# Patient Record
Sex: Male | Born: 1940 | Race: White | Hispanic: No | Marital: Married | Smoking: Former smoker
Health system: Southern US, Community
[De-identification: ages and names within clinical notes are randomized; demographics above are authoritative.]

## PROBLEM LIST (undated history)

## (undated) DIAGNOSIS — F0390 Unspecified dementia without behavioral disturbance: Secondary | ICD-10-CM

## (undated) DIAGNOSIS — G4733 Obstructive sleep apnea (adult) (pediatric): Secondary | ICD-10-CM

## (undated) DIAGNOSIS — I4819 Other persistent atrial fibrillation: Secondary | ICD-10-CM

## (undated) DIAGNOSIS — G5139 Clonic hemifacial spasm, unspecified: Secondary | ICD-10-CM

## (undated) DIAGNOSIS — G20 Parkinson's disease: Secondary | ICD-10-CM

## (undated) DIAGNOSIS — E785 Hyperlipidemia, unspecified: Secondary | ICD-10-CM

## (undated) DIAGNOSIS — I10 Essential (primary) hypertension: Secondary | ICD-10-CM

## (undated) DIAGNOSIS — K5790 Diverticulosis of intestine, part unspecified, without perforation or abscess without bleeding: Secondary | ICD-10-CM

## (undated) DIAGNOSIS — I2510 Atherosclerotic heart disease of native coronary artery without angina pectoris: Secondary | ICD-10-CM

## (undated) DIAGNOSIS — I714 Abdominal aortic aneurysm, without rupture, unspecified: Secondary | ICD-10-CM

## (undated) DIAGNOSIS — E039 Hypothyroidism, unspecified: Secondary | ICD-10-CM

## (undated) DIAGNOSIS — G20C Parkinsonism, unspecified: Secondary | ICD-10-CM

## (undated) DIAGNOSIS — Z7901 Long term (current) use of anticoagulants: Secondary | ICD-10-CM

## (undated) DIAGNOSIS — N182 Chronic kidney disease, stage 2 (mild): Secondary | ICD-10-CM

## (undated) DIAGNOSIS — M109 Gout, unspecified: Secondary | ICD-10-CM

## (undated) DIAGNOSIS — K5641 Fecal impaction: Secondary | ICD-10-CM

## (undated) DIAGNOSIS — K760 Fatty (change of) liver, not elsewhere classified: Secondary | ICD-10-CM

## (undated) DIAGNOSIS — R600 Localized edema: Secondary | ICD-10-CM

## (undated) HISTORY — DX: Localized edema: R60.0

## (undated) HISTORY — DX: Fatty (change of) liver, not elsewhere classified: K76.0

## (undated) HISTORY — DX: Hyperlipidemia, unspecified: E78.5

## (undated) HISTORY — DX: Obstructive sleep apnea (adult) (pediatric): G47.33

## (undated) HISTORY — DX: Abdominal aortic aneurysm, without rupture, unspecified: I71.40

## (undated) HISTORY — DX: Long term (current) use of anticoagulants: Z79.01

## (undated) HISTORY — DX: Other persistent atrial fibrillation: I48.19

## (undated) HISTORY — DX: Gout, unspecified: M10.9

## (undated) HISTORY — DX: Diverticulosis of intestine, part unspecified, without perforation or abscess without bleeding: K57.90

## (undated) HISTORY — DX: Chronic kidney disease, stage 2 (mild): N18.2

## (undated) HISTORY — DX: Hypothyroidism, unspecified: E03.9

## (undated) HISTORY — DX: Abdominal aortic aneurysm, without rupture: I71.4

---

## 1996-11-26 HISTORY — PX: SPINE SURGERY: SHX786

## 1999-03-26 ENCOUNTER — Emergency Department (HOSPITAL_COMMUNITY): Admission: EM | Admit: 1999-03-26 | Discharge: 1999-03-26 | Payer: Self-pay | Admitting: Emergency Medicine

## 1999-03-26 ENCOUNTER — Encounter: Payer: Self-pay | Admitting: Emergency Medicine

## 1999-08-26 ENCOUNTER — Ambulatory Visit: Admission: RE | Admit: 1999-08-26 | Discharge: 1999-08-26 | Payer: Self-pay | Admitting: *Deleted

## 2001-06-09 ENCOUNTER — Encounter: Payer: Self-pay | Admitting: Internal Medicine

## 2001-06-09 ENCOUNTER — Encounter: Admission: RE | Admit: 2001-06-09 | Discharge: 2001-06-09 | Payer: Self-pay | Admitting: Internal Medicine

## 2001-12-27 ENCOUNTER — Emergency Department (HOSPITAL_COMMUNITY): Admission: EM | Admit: 2001-12-27 | Discharge: 2001-12-27 | Payer: Self-pay | Admitting: Emergency Medicine

## 2005-04-06 ENCOUNTER — Encounter: Admission: RE | Admit: 2005-04-06 | Discharge: 2005-04-06 | Payer: Self-pay | Admitting: Internal Medicine

## 2008-05-24 ENCOUNTER — Encounter (INDEPENDENT_AMBULATORY_CARE_PROVIDER_SITE_OTHER): Payer: Self-pay | Admitting: Family Medicine

## 2008-05-24 ENCOUNTER — Ambulatory Visit: Payer: Self-pay | Admitting: *Deleted

## 2008-05-24 ENCOUNTER — Ambulatory Visit (HOSPITAL_COMMUNITY): Admission: RE | Admit: 2008-05-24 | Discharge: 2008-05-24 | Payer: Self-pay | Admitting: Family Medicine

## 2009-01-05 ENCOUNTER — Encounter (INDEPENDENT_AMBULATORY_CARE_PROVIDER_SITE_OTHER): Payer: Self-pay | Admitting: Cardiology

## 2009-01-05 ENCOUNTER — Ambulatory Visit (HOSPITAL_COMMUNITY): Admission: RE | Admit: 2009-01-05 | Discharge: 2009-01-05 | Payer: Self-pay | Admitting: Cardiology

## 2009-06-13 ENCOUNTER — Encounter: Admission: RE | Admit: 2009-06-13 | Discharge: 2009-06-13 | Payer: Self-pay | Admitting: Cardiology

## 2009-06-16 ENCOUNTER — Encounter: Admission: RE | Admit: 2009-06-16 | Discharge: 2009-06-16 | Payer: Self-pay | Admitting: Gastroenterology

## 2009-06-20 ENCOUNTER — Ambulatory Visit: Payer: Self-pay | Admitting: Vascular Surgery

## 2009-07-05 ENCOUNTER — Ambulatory Visit: Payer: Self-pay | Admitting: Vascular Surgery

## 2009-07-15 ENCOUNTER — Ambulatory Visit (HOSPITAL_COMMUNITY): Admission: RE | Admit: 2009-07-15 | Discharge: 2009-07-15 | Payer: Self-pay | Admitting: Interventional Cardiology

## 2009-07-27 DIAGNOSIS — I2510 Atherosclerotic heart disease of native coronary artery without angina pectoris: Secondary | ICD-10-CM

## 2009-07-27 HISTORY — PX: CORONARY ARTERY BYPASS GRAFT: SHX141

## 2009-07-27 HISTORY — DX: Atherosclerotic heart disease of native coronary artery without angina pectoris: I25.10

## 2009-07-29 ENCOUNTER — Ambulatory Visit: Payer: Self-pay | Admitting: Vascular Surgery

## 2009-07-29 ENCOUNTER — Ambulatory Visit (HOSPITAL_COMMUNITY): Admission: RE | Admit: 2009-07-29 | Discharge: 2009-07-29 | Payer: Self-pay | Admitting: Cardiology

## 2009-07-29 ENCOUNTER — Encounter (INDEPENDENT_AMBULATORY_CARE_PROVIDER_SITE_OTHER): Payer: Self-pay | Admitting: Cardiology

## 2009-07-29 ENCOUNTER — Inpatient Hospital Stay (HOSPITAL_BASED_OUTPATIENT_CLINIC_OR_DEPARTMENT_OTHER): Admission: RE | Admit: 2009-07-29 | Discharge: 2009-07-29 | Payer: Self-pay | Admitting: Cardiology

## 2009-08-09 ENCOUNTER — Ambulatory Visit: Payer: Self-pay | Admitting: Surgery

## 2009-08-19 ENCOUNTER — Inpatient Hospital Stay (HOSPITAL_COMMUNITY): Admission: RE | Admit: 2009-08-19 | Discharge: 2009-08-27 | Payer: Self-pay | Admitting: Surgery

## 2009-08-19 ENCOUNTER — Ambulatory Visit: Payer: Self-pay | Admitting: Surgery

## 2009-09-26 ENCOUNTER — Encounter: Admission: RE | Admit: 2009-09-26 | Discharge: 2009-09-26 | Payer: Self-pay | Admitting: Surgery

## 2009-09-26 ENCOUNTER — Ambulatory Visit: Payer: Self-pay | Admitting: Surgery

## 2009-10-06 ENCOUNTER — Encounter (HOSPITAL_COMMUNITY): Admission: RE | Admit: 2009-10-06 | Discharge: 2009-11-25 | Payer: Self-pay | Admitting: Cardiology

## 2009-11-26 ENCOUNTER — Encounter (HOSPITAL_COMMUNITY): Admission: RE | Admit: 2009-11-26 | Discharge: 2010-01-18 | Payer: Self-pay | Admitting: Cardiology

## 2009-12-10 ENCOUNTER — Encounter: Admission: RE | Admit: 2009-12-10 | Discharge: 2009-12-10 | Payer: Self-pay | Admitting: Neurology

## 2009-12-16 ENCOUNTER — Encounter: Admission: RE | Admit: 2009-12-16 | Discharge: 2009-12-16 | Payer: Self-pay | Admitting: Neurology

## 2009-12-27 ENCOUNTER — Ambulatory Visit: Payer: Self-pay | Admitting: Vascular Surgery

## 2010-01-24 HISTORY — PX: ABDOMINAL AORTIC ANEURYSM REPAIR: SUR1152

## 2010-01-26 ENCOUNTER — Inpatient Hospital Stay (HOSPITAL_COMMUNITY): Admission: RE | Admit: 2010-01-26 | Discharge: 2010-01-27 | Payer: Self-pay | Admitting: Vascular Surgery

## 2010-01-26 ENCOUNTER — Ambulatory Visit: Payer: Self-pay | Admitting: Vascular Surgery

## 2010-02-03 ENCOUNTER — Encounter: Admission: RE | Admit: 2010-02-03 | Discharge: 2010-02-03 | Payer: Self-pay | Admitting: Neurology

## 2010-02-23 ENCOUNTER — Emergency Department (HOSPITAL_COMMUNITY): Admission: EM | Admit: 2010-02-23 | Discharge: 2010-02-23 | Payer: Self-pay | Admitting: Emergency Medicine

## 2010-03-07 ENCOUNTER — Encounter: Admission: RE | Admit: 2010-03-07 | Discharge: 2010-03-07 | Payer: Self-pay | Admitting: Vascular Surgery

## 2010-03-07 ENCOUNTER — Ambulatory Visit: Payer: Self-pay | Admitting: Vascular Surgery

## 2010-07-18 ENCOUNTER — Encounter: Admission: RE | Admit: 2010-07-18 | Discharge: 2010-07-18 | Payer: Self-pay | Admitting: Family Medicine

## 2010-09-25 ENCOUNTER — Encounter: Admission: RE | Admit: 2010-09-25 | Discharge: 2010-09-25 | Payer: Self-pay | Admitting: Vascular Surgery

## 2010-09-26 ENCOUNTER — Ambulatory Visit: Payer: Self-pay | Admitting: Vascular Surgery

## 2010-12-16 ENCOUNTER — Other Ambulatory Visit: Payer: Self-pay | Admitting: Vascular Surgery

## 2010-12-16 DIAGNOSIS — I714 Abdominal aortic aneurysm, without rupture, unspecified: Secondary | ICD-10-CM

## 2010-12-17 ENCOUNTER — Encounter: Payer: Self-pay | Admitting: Surgery

## 2010-12-17 ENCOUNTER — Encounter: Payer: Self-pay | Admitting: Internal Medicine

## 2011-02-11 LAB — GLUCOSE, CAPILLARY: Glucose-Capillary: 109 mg/dL — ABNORMAL HIGH (ref 70–99)

## 2011-02-19 LAB — TYPE AND SCREEN
ABO/RH(D): A POS
Antibody Screen: NEGATIVE

## 2011-02-19 LAB — COMPREHENSIVE METABOLIC PANEL
ALT: 29 U/L (ref 0–53)
AST: 23 U/L (ref 0–37)
Albumin: 3.5 g/dL (ref 3.5–5.2)
Calcium: 9.6 mg/dL (ref 8.4–10.5)
GFR calc Af Amer: >60 mL/min (ref >60)
Potassium: 4 mEq/L (ref 3.5–5.1)
Sodium: 138 mEq/L (ref 135–145)
Total Protein: 6.4 g/dL (ref 6.0–8.3)

## 2011-02-19 LAB — DIFFERENTIAL
Basophils Absolute: 0 10*3/uL (ref 0.0–0.1)
Basophils Relative: 0 % (ref 0–1)
Eosinophils Relative: 5 % (ref 0–5)
Lymphocytes Relative: 16 % (ref 12–46)
Neutro Abs: 6.3 10*3/uL (ref 1.7–7.7)

## 2011-02-19 LAB — BASIC METABOLIC PANEL
BUN: 10 mg/dL (ref 6–23)
BUN: 12 mg/dL (ref 6–23)
CO2: 24 mEq/L (ref 19–32)
Calcium: 8.7 mg/dL (ref 8.4–10.5)
Chloride: 103 mEq/L (ref 96–112)
Chloride: 105 mEq/L (ref 96–112)
Creatinine, Ser: 0.74 mg/dL (ref 0.4–1.5)
GFR calc non Af Amer: >60 mL/min (ref >60)
Glucose, Bld: 123 mg/dL — ABNORMAL HIGH (ref 70–99)
Glucose, Bld: 179 mg/dL — ABNORMAL HIGH (ref 70–99)
Potassium: 3.8 mEq/L (ref 3.5–5.1)
Sodium: 131 mEq/L — ABNORMAL LOW (ref 135–145)

## 2011-02-19 LAB — BLOOD GAS, ARTERIAL
Drawn by: 181601
FIO2: 0.21 %
O2 Saturation: 97.5 %
Patient temperature: 98.6
pH, Arterial: 7.476 — ABNORMAL HIGH (ref 7.350–7.450)
pO2, Arterial: 87.4 mmHg (ref 80.0–100.0)

## 2011-02-19 LAB — PROTIME-INR
Prothrombin Time: 16.7 seconds — ABNORMAL HIGH (ref 11.6–15.2)
Prothrombin Time: 26.1 seconds — ABNORMAL HIGH (ref 11.6–15.2)

## 2011-02-19 LAB — CBC
HCT: 33.5 % — ABNORMAL LOW (ref 39.0–52.0)
HCT: 36.8 % — ABNORMAL LOW (ref 39.0–52.0)
Hemoglobin: 11.1 g/dL — ABNORMAL LOW (ref 13.0–17.0)
MCHC: 33.2 g/dL (ref 30.0–36.0)
MCHC: 33.6 g/dL (ref 30.0–36.0)
MCHC: 33.7 g/dL (ref 30.0–36.0)
MCHC: 33.7 g/dL (ref 30.0–36.0)
MCV: 88.9 fL (ref 78.0–100.0)
Platelets: 165 10*3/uL (ref 150–400)
Platelets: 184 10*3/uL (ref 150–400)
RDW: 16.2 % — ABNORMAL HIGH (ref 11.5–15.5)
RDW: 16.3 % — ABNORMAL HIGH (ref 11.5–15.5)
RDW: 16.4 % — ABNORMAL HIGH (ref 11.5–15.5)
WBC: 9.4 10*3/uL (ref 4.0–10.5)

## 2011-02-19 LAB — URINALYSIS, ROUTINE W REFLEX MICROSCOPIC
Glucose, UA: NEGATIVE mg/dL
Protein, ur: NEGATIVE mg/dL
Specific Gravity, Urine: 1.016 (ref 1.005–1.030)
pH: 7 (ref 5.0–8.0)

## 2011-02-19 LAB — POCT CARDIAC MARKERS

## 2011-02-19 LAB — MRSA PCR SCREENING: MRSA by PCR: NEGATIVE

## 2011-02-19 LAB — GLUCOSE, CAPILLARY: Glucose-Capillary: 101 mg/dL — ABNORMAL HIGH (ref 70–99)

## 2011-02-26 IMAGING — US US ABDOMEN COMPLETE
1 series · 13 of 25 positions shown · non-contrast
Comparison: CT 06/13/2009

CLINICAL DATA: Increased LFTs

COMPLETE ABDOMINAL ULTRASOUND

[Series 1: us abdomen complete · 0.27mm/px · 13 of 101 slices shown]
[im 1/101]
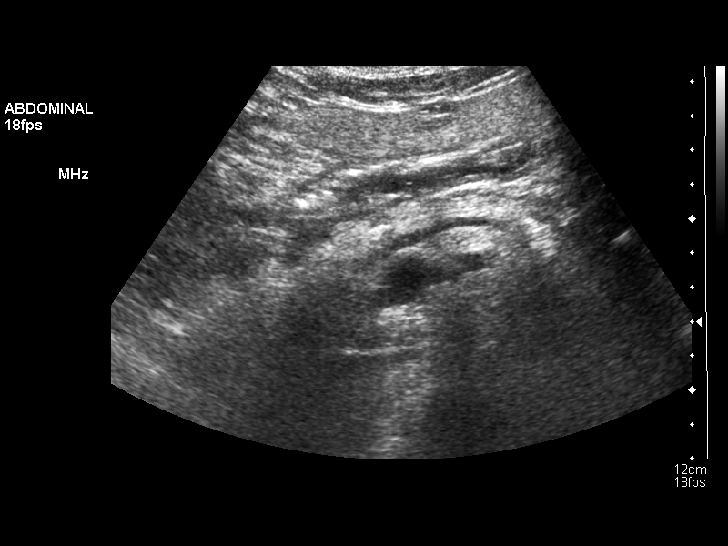
[im 9/101]
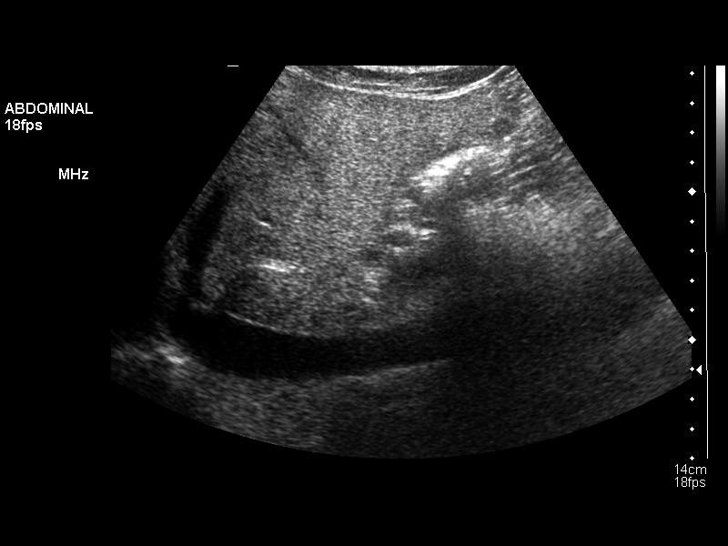
[im 17/101]
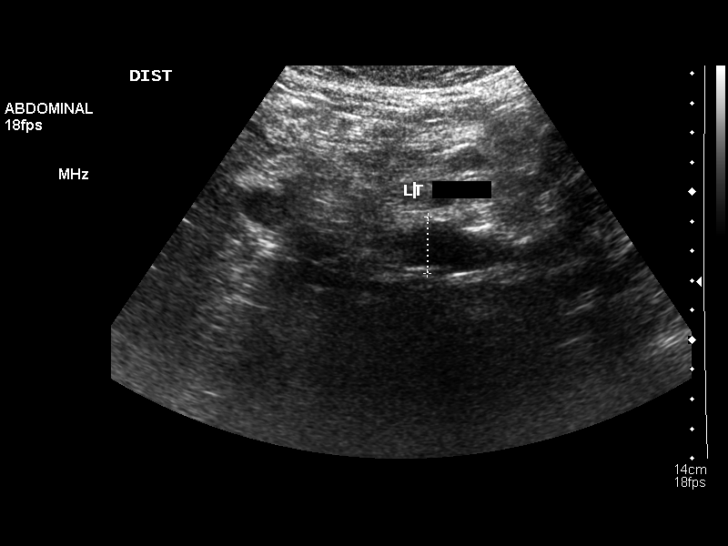
[im 26/101]
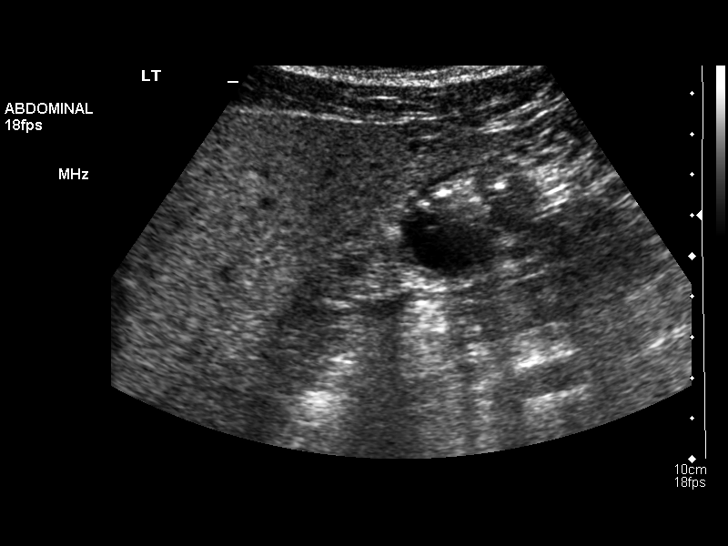
[im 34/101]
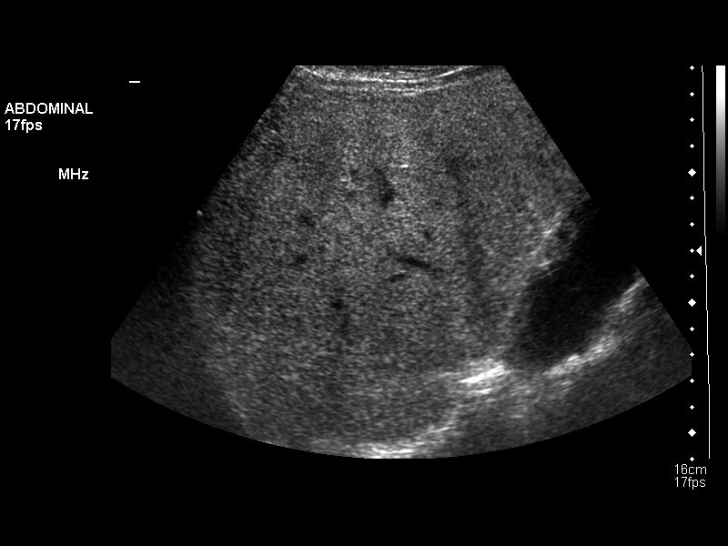
[im 42/101]
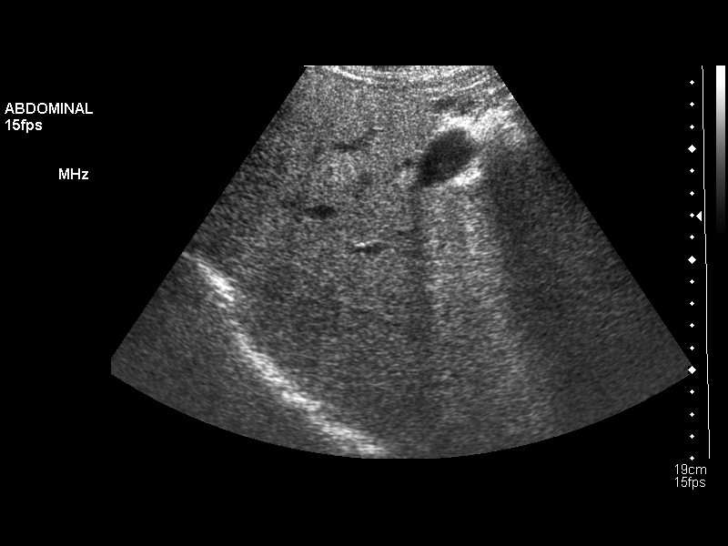
[im 51/101]
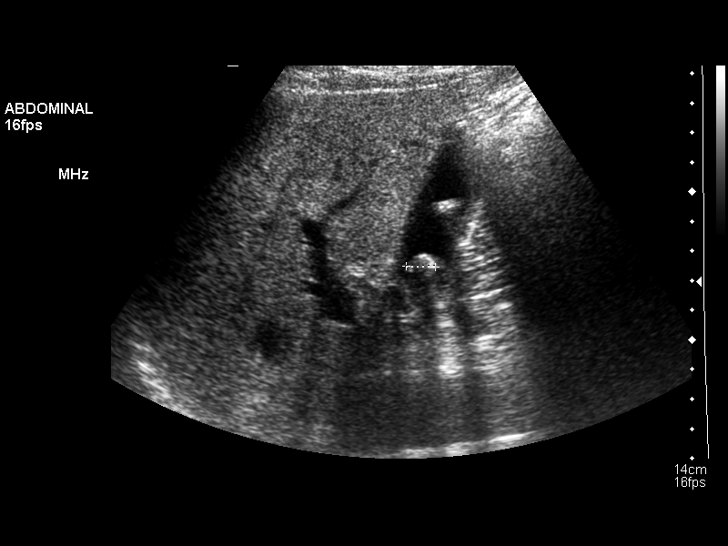
[im 59/101]
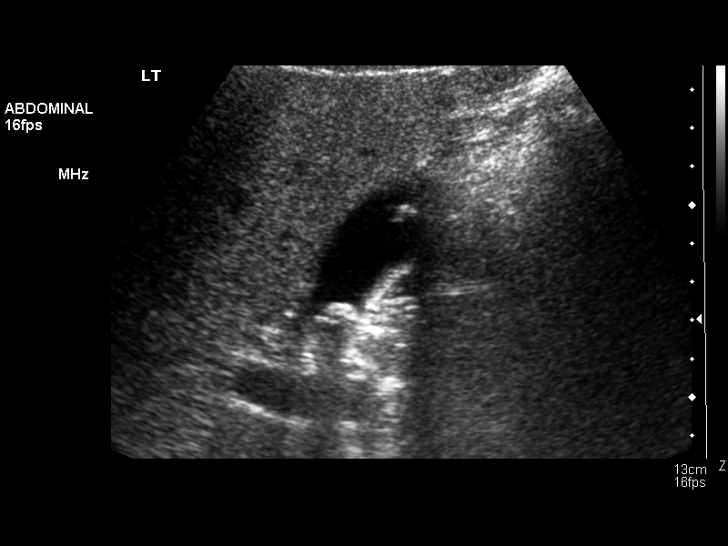
[im 67/101]
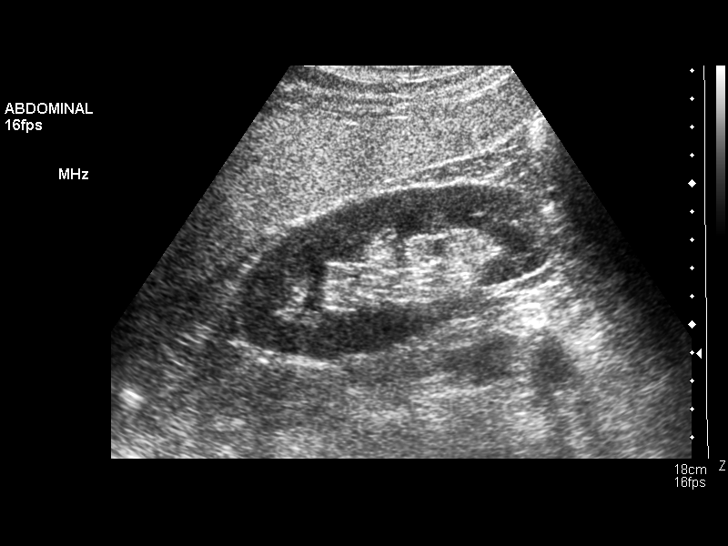
[im 76/101]
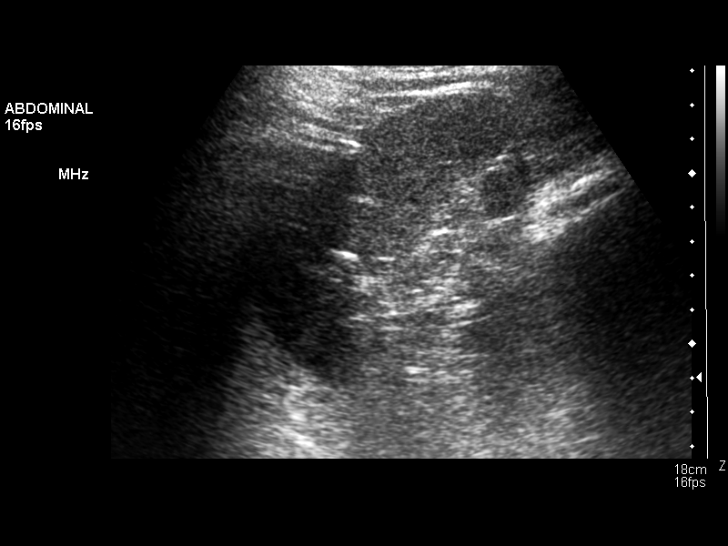
[im 84/101]
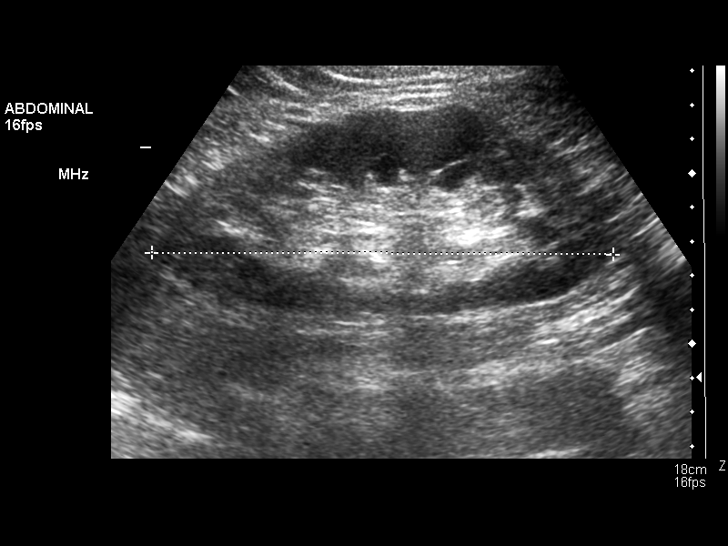
[im 92/101]
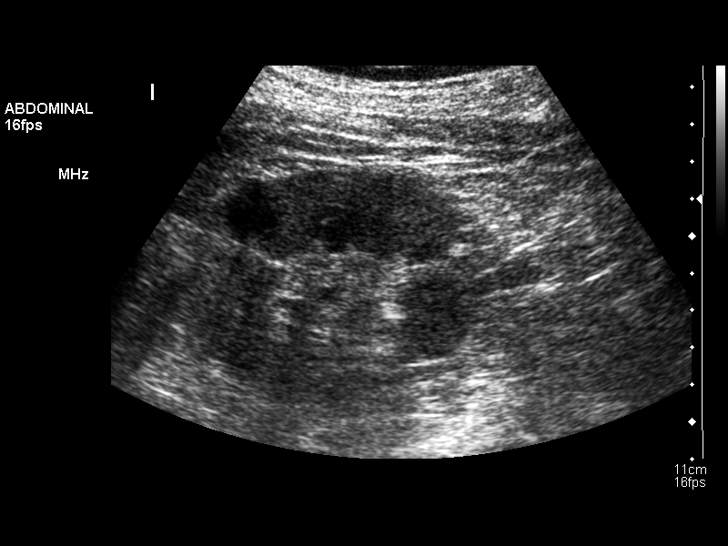
[im 101/101]
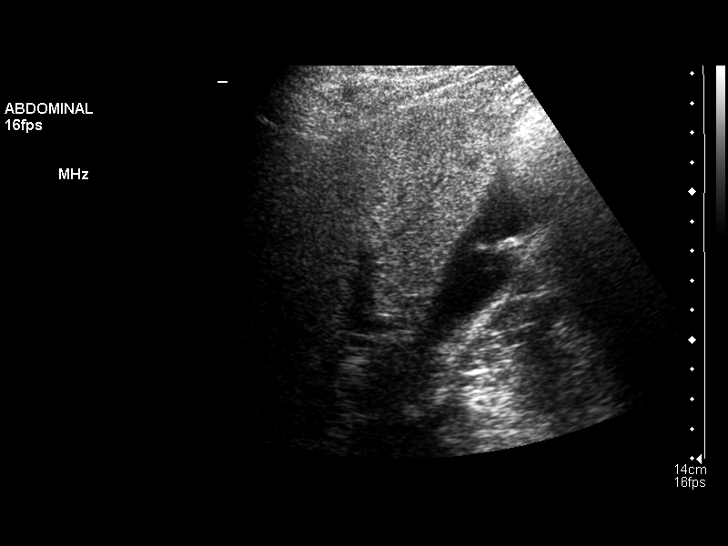

[13 of 25 positions shown; findings below may reference images not displayed]

FINDINGS: Gallbladder:  Multiple small stones less than 1 cm in size which
are mobile.  No wall thickening.

Common bile duct:  Normal caliber, 4 mm.

Liver:  Multiple small hypoechoic lesions within the liver.  These
have internal echoes but increased through transmission.  I favor
these represent small complex cysts.

IVC:  Patent.

Pancreas:  Normal size and echotexture.  No focal abnormality.

Spleen:  A few small granulomas noted in the spleen.  Normal size,
6.5 cm in cranial caudal length.

Right Kidney:  Normal size and echotexture.  No focal abnormality.
No hydronephrosis.

Left Kidney:  12.8 cm cyst in the midpole region which appears
benign.  There is a 5 mm echogenic focus within the mid pole which
could represent a small angiomyolipoma.  No hydronephrosis.

Abdominal aorta:  Marked irregularity and mural plaque within the
aorta which is aneurysmal infrarenally, measuring maximally 5.0 cm.
IMPRESSION: Small anechoic and hypoechoic areas within the liver.  I favor
these represent a combination of simple and complex cysts.

Cholelithiasis.  No sonographic evidence of acute cholecystitis.

## 2011-02-27 LAB — GLUCOSE, CAPILLARY: Glucose-Capillary: 110 mg/dL — ABNORMAL HIGH (ref 70–99)

## 2011-03-01 LAB — BASIC METABOLIC PANEL
BUN: 12 mg/dL (ref 6–23)
CO2: 26 mEq/L (ref 19–32)
CO2: 27 mEq/L (ref 19–32)
Chloride: 106 mEq/L (ref 96–112)
Glucose, Bld: 105 mg/dL — ABNORMAL HIGH (ref 70–99)
Glucose, Bld: 105 mg/dL — ABNORMAL HIGH (ref 70–99)
Potassium: 3.6 mEq/L (ref 3.5–5.1)
Potassium: 3.8 mEq/L (ref 3.5–5.1)
Sodium: 137 mEq/L (ref 135–145)

## 2011-03-02 LAB — BASIC METABOLIC PANEL
BUN: 10 mg/dL (ref 6–23)
BUN: 15 mg/dL (ref 6–23)
CO2: 23 mEq/L (ref 19–32)
CO2: 25 mEq/L (ref 19–32)
CO2: 26 mEq/L (ref 19–32)
CO2: 27 mEq/L (ref 19–32)
Calcium: 8.5 mg/dL (ref 8.4–10.5)
Calcium: 8.5 mg/dL (ref 8.4–10.5)
Calcium: 8.6 mg/dL (ref 8.4–10.5)
Calcium: 8.7 mg/dL (ref 8.4–10.5)
Chloride: 100 mEq/L (ref 96–112)
Chloride: 102 mEq/L (ref 96–112)
Chloride: 106 mEq/L (ref 96–112)
Creatinine, Ser: 0.82 mg/dL (ref 0.4–1.5)
Creatinine, Ser: 0.84 mg/dL (ref 0.4–1.5)
Creatinine, Ser: 0.89 mg/dL (ref 0.4–1.5)
Creatinine, Ser: 0.92 mg/dL (ref 0.4–1.5)
GFR calc Af Amer: >60 mL/min (ref >60)
GFR calc Af Amer: >60 mL/min (ref >60)
GFR calc Af Amer: >60 mL/min (ref >60)
GFR calc Af Amer: >60 mL/min (ref >60)
GFR calc non Af Amer: >60 mL/min (ref >60)
GFR calc non Af Amer: >60 mL/min (ref >60)
GFR calc non Af Amer: >60 mL/min (ref >60)
Glucose, Bld: 104 mg/dL — ABNORMAL HIGH (ref 70–99)
Glucose, Bld: 118 mg/dL — ABNORMAL HIGH (ref 70–99)
Glucose, Bld: 136 mg/dL — ABNORMAL HIGH (ref 70–99)
Glucose, Bld: 137 mg/dL — ABNORMAL HIGH (ref 70–99)
Glucose, Bld: 163 mg/dL — ABNORMAL HIGH (ref 70–99)
Potassium: 3.3 mEq/L — ABNORMAL LOW (ref 3.5–5.1)
Potassium: 3.9 mEq/L (ref 3.5–5.1)
Sodium: 135 mEq/L (ref 135–145)
Sodium: 136 mEq/L (ref 135–145)
Sodium: 136 mEq/L (ref 135–145)
Sodium: 141 mEq/L (ref 135–145)

## 2011-03-02 LAB — POCT I-STAT 4, (NA,K, GLUC, HGB,HCT)
Glucose, Bld: 103 mg/dL — ABNORMAL HIGH (ref 70–99)
Glucose, Bld: 113 mg/dL — ABNORMAL HIGH (ref 70–99)
Glucose, Bld: 115 mg/dL — ABNORMAL HIGH (ref 70–99)
Glucose, Bld: 116 mg/dL — ABNORMAL HIGH (ref 70–99)
HCT: 30 % — ABNORMAL LOW (ref 39.0–52.0)
HCT: 31 % — ABNORMAL LOW (ref 39.0–52.0)
HCT: 33 % — ABNORMAL LOW (ref 39.0–52.0)
Hemoglobin: 10.2 g/dL — ABNORMAL LOW (ref 13.0–17.0)
Hemoglobin: 10.5 g/dL — ABNORMAL LOW (ref 13.0–17.0)
Hemoglobin: 11.2 g/dL — ABNORMAL LOW (ref 13.0–17.0)
Hemoglobin: 12.6 g/dL — ABNORMAL LOW (ref 13.0–17.0)
Potassium: 3.3 mEq/L — ABNORMAL LOW (ref 3.5–5.1)
Potassium: 3.8 mEq/L (ref 3.5–5.1)
Potassium: 3.9 mEq/L (ref 3.5–5.1)
Sodium: 136 mEq/L (ref 135–145)
Sodium: 137 mEq/L (ref 135–145)
Sodium: 140 mEq/L (ref 135–145)

## 2011-03-02 LAB — CBC
HCT: 31.3 % — ABNORMAL LOW (ref 39.0–52.0)
HCT: 32.7 % — ABNORMAL LOW (ref 39.0–52.0)
HCT: 32.8 % — ABNORMAL LOW (ref 39.0–52.0)
HCT: 35 % — ABNORMAL LOW (ref 39.0–52.0)
HCT: 35.6 % — ABNORMAL LOW (ref 39.0–52.0)
HCT: 44.7 % (ref 39.0–52.0)
Hemoglobin: 10.6 g/dL — ABNORMAL LOW (ref 13.0–17.0)
Hemoglobin: 10.7 g/dL — ABNORMAL LOW (ref 13.0–17.0)
Hemoglobin: 11.3 g/dL — ABNORMAL LOW (ref 13.0–17.0)
Hemoglobin: 11.8 g/dL — ABNORMAL LOW (ref 13.0–17.0)
Hemoglobin: 11.9 g/dL — ABNORMAL LOW (ref 13.0–17.0)
Hemoglobin: 12.1 g/dL — ABNORMAL LOW (ref 13.0–17.0)
Hemoglobin: 15.3 g/dL (ref 13.0–17.0)
MCHC: 33.9 g/dL (ref 30.0–36.0)
MCHC: 34.2 g/dL (ref 30.0–36.0)
MCHC: 34.3 g/dL (ref 30.0–36.0)
MCHC: 34.5 g/dL (ref 30.0–36.0)
MCHC: 34.5 g/dL (ref 30.0–36.0)
MCHC: 34.7 g/dL (ref 30.0–36.0)
MCV: 91.9 fL (ref 78.0–100.0)
MCV: 91.9 fL (ref 78.0–100.0)
MCV: 92.1 fL (ref 78.0–100.0)
MCV: 92.1 fL (ref 78.0–100.0)
MCV: 92.2 fL (ref 78.0–100.0)
MCV: 93.3 fL (ref 78.0–100.0)
Platelets: 131 10*3/uL — ABNORMAL LOW (ref 150–400)
Platelets: 140 10*3/uL — ABNORMAL LOW (ref 150–400)
RBC: 3.35 MIL/uL — ABNORMAL LOW (ref 4.22–5.81)
RBC: 3.55 MIL/uL — ABNORMAL LOW (ref 4.22–5.81)
RBC: 3.56 MIL/uL — ABNORMAL LOW (ref 4.22–5.81)
RBC: 3.71 MIL/uL — ABNORMAL LOW (ref 4.22–5.81)
RBC: 3.77 MIL/uL — ABNORMAL LOW (ref 4.22–5.81)
RBC: 3.88 MIL/uL — ABNORMAL LOW (ref 4.22–5.81)
RBC: 4.85 MIL/uL (ref 4.22–5.81)
RDW: 14.1 % (ref 11.5–15.5)
RDW: 14.2 % (ref 11.5–15.5)
RDW: 14.3 % (ref 11.5–15.5)
RDW: 14.3 % (ref 11.5–15.5)
RDW: 14.5 % (ref 11.5–15.5)
WBC: 14.6 10*3/uL — ABNORMAL HIGH (ref 4.0–10.5)
WBC: 15.3 10*3/uL — ABNORMAL HIGH (ref 4.0–10.5)
WBC: 16 10*3/uL — ABNORMAL HIGH (ref 4.0–10.5)
WBC: 16.7 10*3/uL — ABNORMAL HIGH (ref 4.0–10.5)

## 2011-03-02 LAB — GLUCOSE, CAPILLARY
Glucose-Capillary: 118 mg/dL — ABNORMAL HIGH (ref 70–99)
Glucose-Capillary: 120 mg/dL — ABNORMAL HIGH (ref 70–99)
Glucose-Capillary: 121 mg/dL — ABNORMAL HIGH (ref 70–99)
Glucose-Capillary: 127 mg/dL — ABNORMAL HIGH (ref 70–99)
Glucose-Capillary: 127 mg/dL — ABNORMAL HIGH (ref 70–99)
Glucose-Capillary: 138 mg/dL — ABNORMAL HIGH (ref 70–99)
Glucose-Capillary: 143 mg/dL — ABNORMAL HIGH (ref 70–99)
Glucose-Capillary: 145 mg/dL — ABNORMAL HIGH (ref 70–99)
Glucose-Capillary: 149 mg/dL — ABNORMAL HIGH (ref 70–99)
Glucose-Capillary: 159 mg/dL — ABNORMAL HIGH (ref 70–99)
Glucose-Capillary: 54 mg/dL — ABNORMAL LOW (ref 70–99)

## 2011-03-02 LAB — POCT I-STAT, CHEM 8
BUN: 19 mg/dL (ref 6–23)
Chloride: 103 mEq/L (ref 96–112)
Chloride: 106 mEq/L (ref 96–112)
Glucose, Bld: 145 mg/dL — ABNORMAL HIGH (ref 70–99)
HCT: 36 % — ABNORMAL LOW (ref 39.0–52.0)
Hemoglobin: 12.2 g/dL — ABNORMAL LOW (ref 13.0–17.0)
Potassium: 3.8 mEq/L (ref 3.5–5.1)
Potassium: 4.5 mEq/L (ref 3.5–5.1)
Sodium: 136 mEq/L (ref 135–145)

## 2011-03-02 LAB — URINALYSIS, ROUTINE W REFLEX MICROSCOPIC
Bilirubin Urine: NEGATIVE
Hgb urine dipstick: NEGATIVE
Specific Gravity, Urine: 1.027 (ref 1.005–1.030)
Urobilinogen, UA: 0.2 mg/dL (ref 0.0–1.0)
pH: 5.5 (ref 5.0–8.0)

## 2011-03-02 LAB — TYPE AND SCREEN
ABO/RH(D): A POS
Antibody Screen: NEGATIVE
Antibody Screen: NEGATIVE

## 2011-03-02 LAB — POCT I-STAT 3, ART BLOOD GAS (G3+)
Acid-base deficit: 1 mmol/L (ref 0.0–2.0)
Bicarbonate: 24.6 mEq/L — ABNORMAL HIGH (ref 20.0–24.0)
Bicarbonate: 24.8 mEq/L — ABNORMAL HIGH (ref 20.0–24.0)
O2 Saturation: 96 %
Patient temperature: 36.2
TCO2: 26 mmol/L (ref 0–100)
TCO2: 26 mmol/L (ref 0–100)
TCO2: 27 mmol/L (ref 0–100)
pCO2 arterial: 39.7 mmHg (ref 35.0–45.0)
pH, Arterial: 7.364 (ref 7.350–7.450)
pH, Arterial: 7.402 (ref 7.350–7.450)
pO2, Arterial: 82 mmHg (ref 80.0–100.0)
pO2, Arterial: 89 mmHg (ref 80.0–100.0)

## 2011-03-02 LAB — COMPREHENSIVE METABOLIC PANEL
ALT: 89 U/L — ABNORMAL HIGH (ref 0–53)
CO2: 25 mEq/L (ref 19–32)
Calcium: 9.9 mg/dL (ref 8.4–10.5)
GFR calc non Af Amer: >60 mL/min (ref >60)
Glucose, Bld: 125 mg/dL — ABNORMAL HIGH (ref 70–99)
Sodium: 136 mEq/L (ref 135–145)

## 2011-03-02 LAB — MAGNESIUM
Magnesium: 2.4 mg/dL (ref 1.5–2.5)
Magnesium: 2.6 mg/dL — ABNORMAL HIGH (ref 1.5–2.5)

## 2011-03-02 LAB — PROTIME-INR
INR: 1 (ref 0.00–1.49)
Prothrombin Time: 16.7 seconds — ABNORMAL HIGH (ref 11.6–15.2)

## 2011-03-02 LAB — BLOOD GAS, ARTERIAL
Acid-Base Excess: 1.9 mmol/L (ref 0.0–2.0)
Bicarbonate: 25.2 mEq/L — ABNORMAL HIGH (ref 20.0–24.0)
O2 Saturation: 98.2 %
TCO2: 26.3 mmol/L (ref 0–100)
pO2, Arterial: 93.8 mmHg (ref 80.0–100.0)

## 2011-03-02 LAB — HEMOGLOBIN A1C: Hgb A1c MFr Bld: 6.5 % — ABNORMAL HIGH (ref 4.6–6.1)

## 2011-03-02 LAB — CREATININE, SERUM
Creatinine, Ser: 0.86 mg/dL (ref 0.4–1.5)
GFR calc Af Amer: >60 mL/min (ref >60)
GFR calc non Af Amer: >60 mL/min (ref >60)

## 2011-03-02 LAB — MRSA PCR SCREENING: MRSA by PCR: NEGATIVE

## 2011-03-02 LAB — ABO/RH: ABO/RH(D): A POS

## 2011-03-27 IMAGING — CT CT HEART MORP W/ CTA COR W/ SCORE W/ CA W/CM &/OR W/O CM
1 of 3 series · 13 of 20 positions shown, 17 images · IV contrast (omnipaque)
Comparison: Images of the lower chest obtained at time of CT
abdomen 06/13/2009.

Addendum Begins

CORONARY CTA WITH CALCIUM SCORE 07/15/2009 [DATE]
Reading Physician: Richline Nin
Contrast: 120 ml of Omnipaque 350
Indications: Preoperative coronary evaluation prior to aortic
aneurysm resection
Procedure: Patient was pre-medicated with metoprolol.  Resting
heart rate at the time of this study was in the 50-60 range.  A
retro dose modulation protocol was used. The protocol was otherwise
standard.
DETAILED FINDINGS:
Quality of Study: Fair
Left Main: Calcified but widely patent
Left Anterior Descending: The LAD is calcified in the proximal and
mid segments.  It gives origin to three diagonal branches.  There
is ectasia in the proximal segment.  The third diagonal, a large
vessel, appears to be totally occluded.  This diagonal is heavily
calcified.  Soft and hard plaque is noted throughout the proximal
LAD.  Beyond the region of ectasia there is mild narrowing felt to
be less than 50%.
Left Circumflex: The circumflex is nondominant.  There is midvessel
calcification.  To small obtuse marginal branches arise distally.
The circumflex contains mild to moderate obstruction in the mid
vessel less than 50%
Right Coronary Artery: The right coronary is dominant.  It gives
origin to a large posterior descending as well as left ventricular
branchs.  There is moderate to heavy calcification in the proximal
and midsegment.  There is significant ectasia/aneurysmal dilatation
in the mid RCA.  Mild to moderate obstruction less than 50% is
noted in the mid vessel.  The PDA and left ventricular branches are
not well visualized.
Ventricular Function/Wall Motion: Normal
LV Ejection Fraction: Greater than 50%
Left Atrium, Right Atrium, RV Size: Prominent right ventricular
size.  Biatrial enlargement.
Pericardium: No significant effusion.
Coronary Calcium Score: 778 Agatston units
Aorta: Upper normal to mildly dilated.
Other:

[Series 11: 70% only · axial · 0.46mm/px · z∈[-283,-133]mm · 13 of 432 slices shown, 17 images]
[im 29/432  vessel]
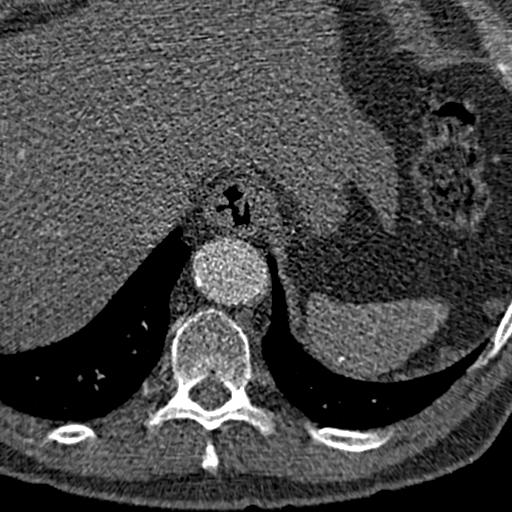
[im 29/432  lung]
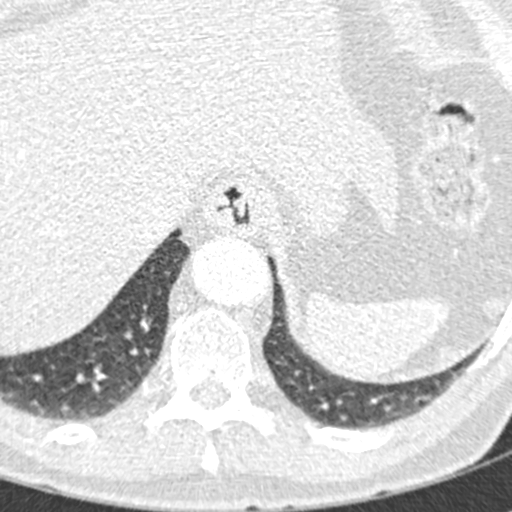
[im 58/432  vessel]
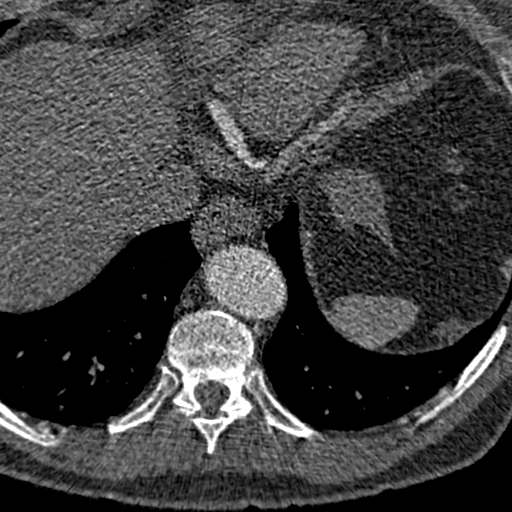
[im 87/432  vessel]
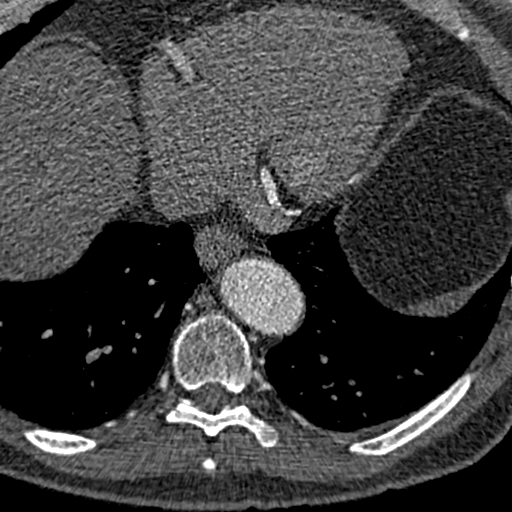
[im 115/432  vessel]
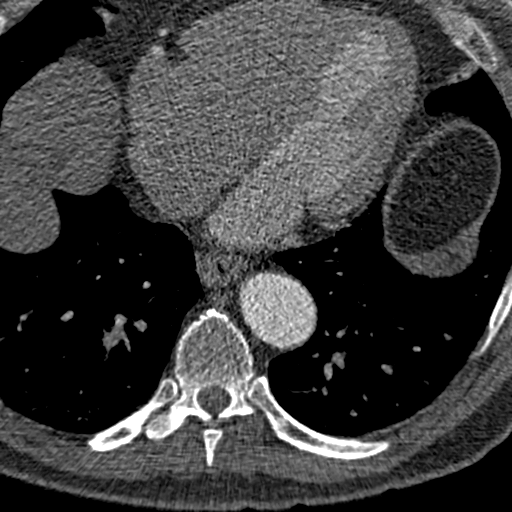
[im 144/432  vessel]
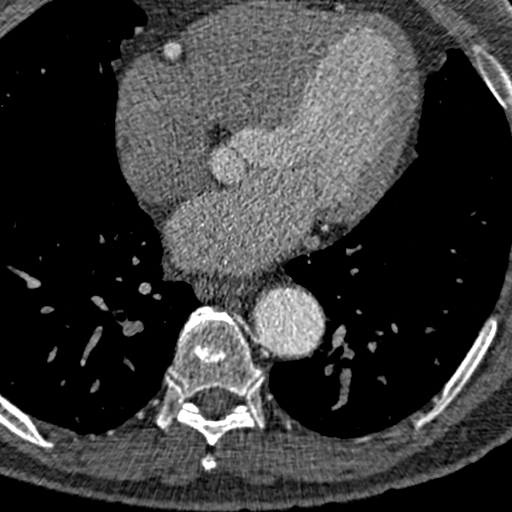
[im 144/432  lung]
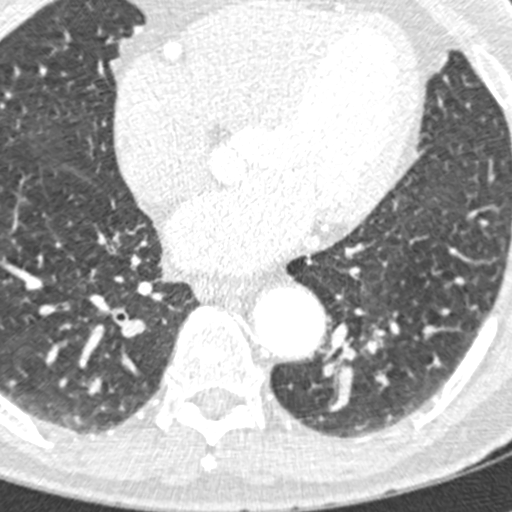
[im 173/432  vessel]
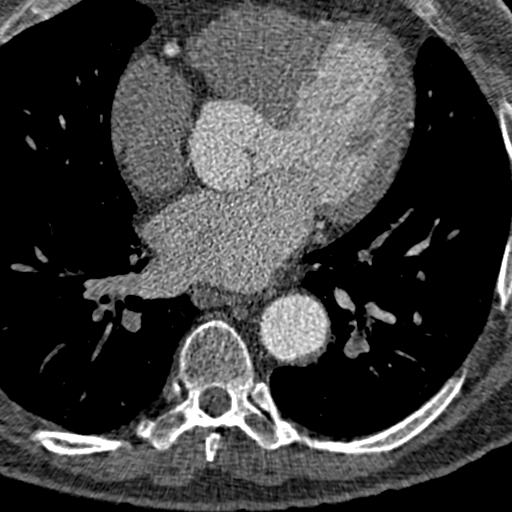
[im 230/432  vessel]
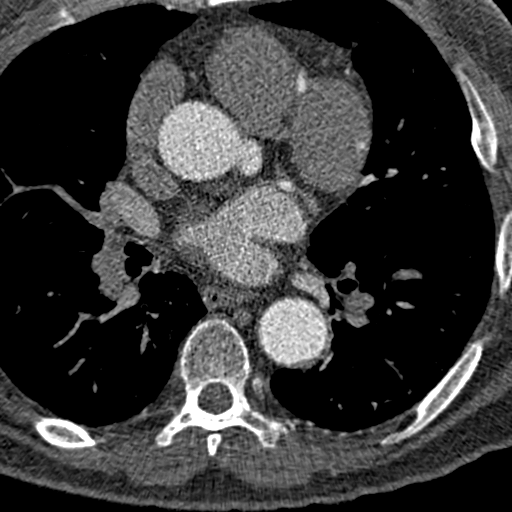
[im 259/432  vessel]
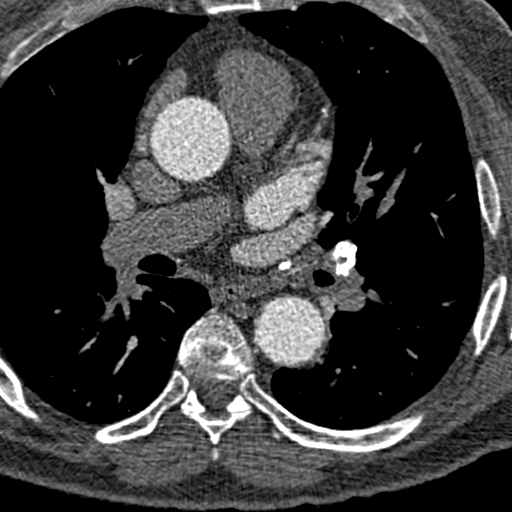
[im 288/432  vessel]
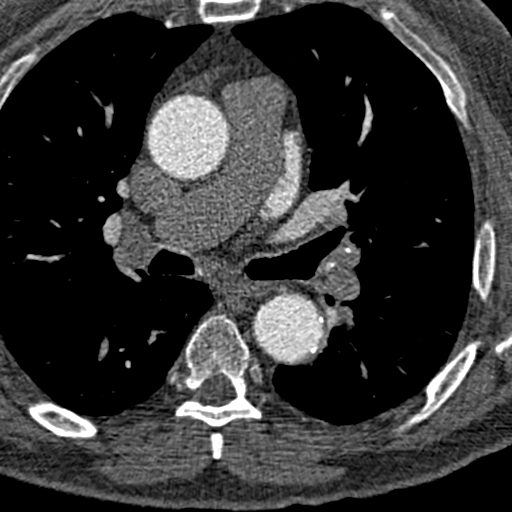
[im 288/432  lung]
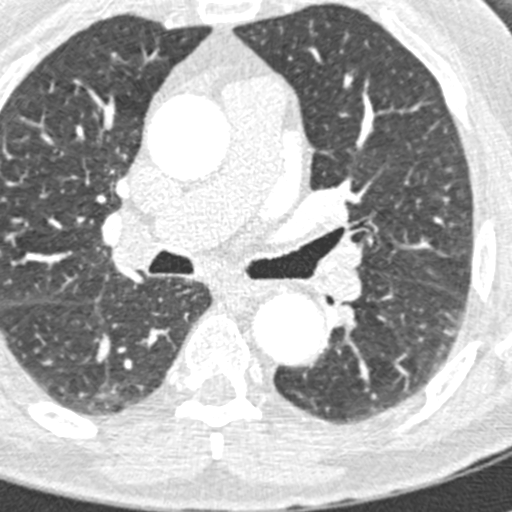
[im 317/432  vessel]
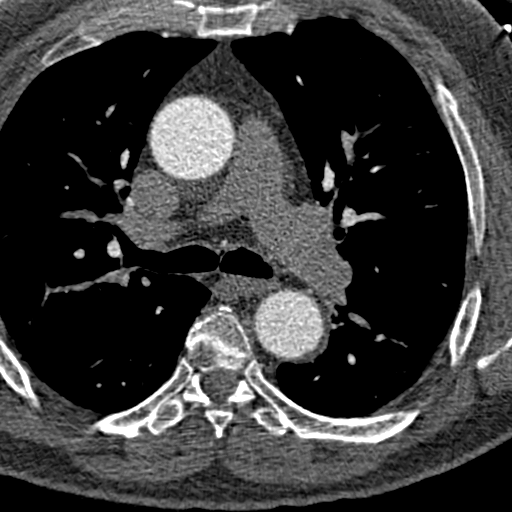
[im 345/432  vessel]
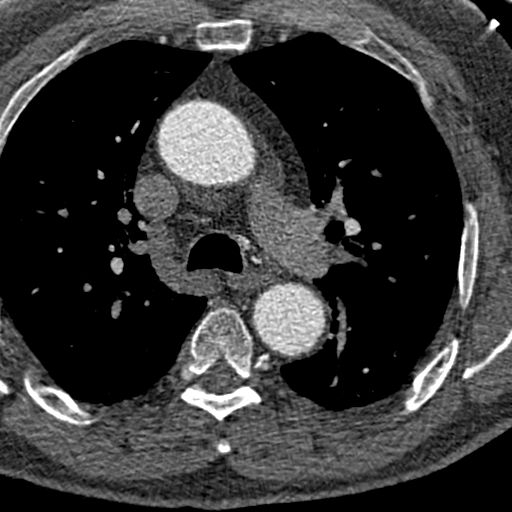
[im 374/432  vessel]
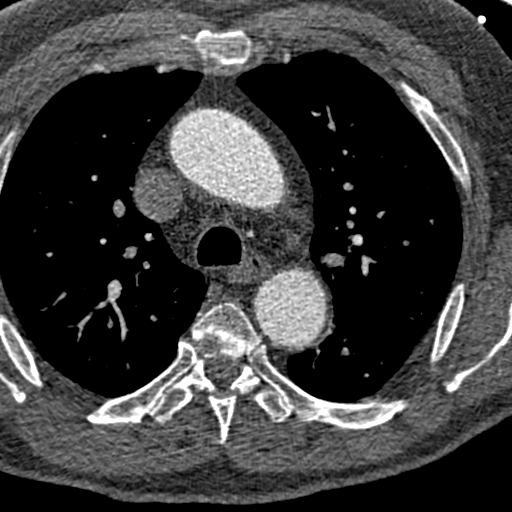
[im 403/432  vessel]
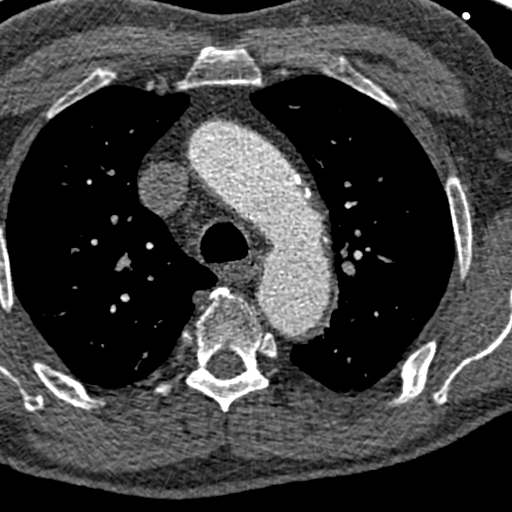
[im 403/432  lung]
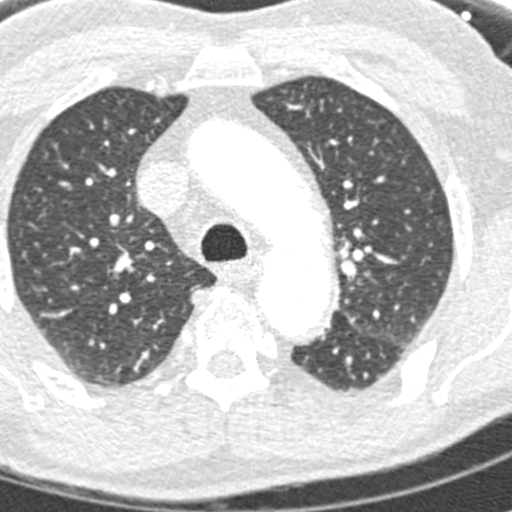

[13 of 20 positions shown; findings below may reference images not displayed]

IMPRESSION: 1.  Coronary atherosclerosis with occlusion of a moderate to large
diagonal and ectasia of the right  and LAD coronary arteries.
There is mild to moderate obstruction in the LAD and right coronary
proximal and mid segments. The sensitivity and specificity for
identifying significant obstructive lesions is diminished by the
presence of coronary calcium.
2.  Normal left ventricular function.
3.  Mildly dilated ascending aorta with atherosclerotic plaque
noted.
4.  Severely elevated coronary calcium score at 778 Agatston units.

Addendum Ends
OVER-READ INTERPRETATION - CT CHEST

The following report is an over-read performed by radiologist Dr.
[DATE].  This over-read does not include interpretation of
cardiac or coronary anatomy or pathology.  The CTA interpretation
by the cardiologist is attached.
FINDINGS: Visualized pulmonary parenchyma clear without evidence of
localized airspace consolidation, interstitial lung disease, or
nodularity.  Scattered areas of hyperlucency are present consistent
with localized areas of air trapping.  No pleural effusions.

Scattered normal-sized mediastinal and bilateral hilar lymph nodes.
Calcified left hilar lymph nodes.  No significant lymphadenopathy
in the chest.

Small hiatal hernia.  Sub-centimeter cyst in the liver near the
dome.  Calcified granuloma in the upper pole of the spleen.
Visualized upper abdomen otherwise unremarkable.  Bone window
images demonstrate mild mid and lower thoracic spondylosis.
IMPRESSION: 1.  No acute cardiopulmonary disease within the visualized chest.
2.  Scattered areas of hyperlucency in both lungs consistent with
localized areas of air trapping as can be seen in patients with
asthma and/or bronchitis.
3.  Old granulomatous disease.
4.  Small hiatal hernia.

## 2011-03-29 ENCOUNTER — Ambulatory Visit
Admission: RE | Admit: 2011-03-29 | Discharge: 2011-03-29 | Disposition: A | Discharge status: Home / Self Care | Payer: Medicare Other | Source: Ambulatory Visit | Attending: Family Medicine | Admitting: Family Medicine

## 2011-03-29 ENCOUNTER — Other Ambulatory Visit: Payer: Self-pay | Admitting: Family Medicine

## 2011-03-29 DIAGNOSIS — M7989 Other specified soft tissue disorders: Secondary | ICD-10-CM

## 2011-03-29 DIAGNOSIS — L539 Erythematous condition, unspecified: Secondary | ICD-10-CM

## 2011-04-09 ENCOUNTER — Ambulatory Visit
Admission: RE | Admit: 2011-04-09 | Discharge: 2011-04-09 | Disposition: A | Discharge status: Home / Self Care | Payer: Medicare Other | Source: Ambulatory Visit | Attending: Vascular Surgery | Admitting: Vascular Surgery

## 2011-04-09 DIAGNOSIS — I714 Abdominal aortic aneurysm, without rupture, unspecified: Secondary | ICD-10-CM

## 2011-04-09 MED ORDER — IOHEXOL 300 MG/ML  SOLN
100.0000 mL | Freq: Once | INTRAMUSCULAR | Status: AC | PRN
  Administered 2011-04-09: 100 mL via INTRAVENOUS

## 2011-04-10 ENCOUNTER — Other Ambulatory Visit: Payer: Self-pay | Admitting: Vascular Surgery

## 2011-04-10 ENCOUNTER — Ambulatory Visit (INDEPENDENT_AMBULATORY_CARE_PROVIDER_SITE_OTHER): Payer: Medicare Other | Admitting: Vascular Surgery

## 2011-04-10 DIAGNOSIS — I714 Abdominal aortic aneurysm, without rupture: Secondary | ICD-10-CM

## 2011-04-10 NOTE — Procedures (Signed)
DUPLEX ULTRASOUND OF ABDOMINAL AORTA   INDICATION:  Follow up of abdominal aortic aneurysm.   HISTORY:  Diabetes:  yes  Cardiac:  CABG  Hypertension:  yes  Smoking:  previous  Connective Tissue Disorder:  Family History:  no  Previous Surgery:  No   DUPLEX EXAM:         AP (cm)                   TRANSVERSE (cm)  Proximal             2.43 cm                   2.62 cm  Mid                  2.85 cm                   2.62 cm  Distal               4.77 cm                   5.05 cm  Right Iliac          1.38 cm                   cm  Left Iliac           1.25 cm                   Cm   PREVIOUS:  CT 5.0.    IMPRESSION:  Stable abdominal aortic aneurysm with largest measurement  of 4.77 cm x 5.0 cm.   ___________________________________________  Quita Skye. Hart [place], M.D.   AS/MEDQ  D:  12/27/2009  T:  12/28/2009  Job:  [id number]

## 2011-04-10 NOTE — H&P (Signed)
HISTORY AND PHYSICAL EXAMINATION   July 05, 2009   Re:  Troy [name], Troy [name]               DOB:  28-Sep-1941   CHIEF COMPLAINT:  Abdominal aortic aneurysm.   HISTORY OF PRESENT ILLNESS:  This 70 year old male patient of Dr.  Norris [name] had known abdominal aortic aneurysm which increased from 4.5 to  approximately 5 cm in diameter by CT scanning in July of this year.  He  was evaluated and offered the option of following this versus aortic  stent grafting and he opted to proceed with an aortic stent graft.  He  has had [name] preoperative cardiac evaluation and is now cleared for surgery  for this aneurysm.   PAST MEDICAL HISTORY:  1. Hypertension.  2. Hyperlipidemia.  3. Negative for diabetes, coronary artery disease, COPD or stroke.   PAST SURGICAL HISTORY:  1. Lumbar laminectomy in 1998.  2. Tonsillectomy.   FAMILY HISTORY:  Negative for coronary artery disease, diabetes or  stroke.   SOCIAL HISTORY:  He is married, is retired and has [name] 50 plus pack year  smoking history but did stop in 2009 but he smoked up to 3 packs per day  in the past.  Drinks occasional alcohol.   REVIEW OF SYSTEMS:  Positive for weight gain, some dyspnea on exertion.  Denies chest pain, hemoptysis, wheezing, GI or GU symptoms.  No  claudication.   ALLERGIES:  Does have [name] history of an anaphylactic reaction to be [name] bee  sting and also to one medicine (Prinzide) which is an ACE inhibitor but  has tolerated other ACE inhibitors   MEDICATIONS:  1. 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  2. Clonidine/hydrochloride 0.3 mg b.i.d.  3. Diltiazem ER 240 mg one b.i.d.  4. Lisinopril/hydrochlorothiazide 20/25 mg one in the morning.  5. Amlodipine besylate 2.5 mg (Norvasc) one q.[name].m.  6. Micardis 80 mg one in the evening.  7. Levothyroxine sodium 125 mcg one in the morning.  8. Foltx 2.5-25-2 mg one in the evening.  9. Aspirin 81 mg one daily.  10.Fish oil 1 tablespoon  daily.  11.Clobetasol topical solution 0.5 mg apply to affected area about      twice daily.   PHYSICAL EXAM:  Blood pressure 150/80, heart rate 80, respirations 14.  General:  He is [name] healthy-appearing male, is alert and oriented x3.  Neck:  Supple, 3+ carotid pulses palpated.  No bruits are audible.  Neurologic:  Normal.  No palpable adenopathy in the neck.  Chest:  Clear  to auscultation.  Cardiovascular:  Regular rhythm.  No murmurs.  Abdomen:  Soft, nontender with [name] 5 cm pulsatile mass.  He has 3+  femoral, popliteal and posterior tibial pulses bilaterally.   IMPRESSION:  Infrarenal abdominal aortic aneurysm.   PLAN:  Is to admit the patient on September 16 for an elective stent  graft repair of this abdominal aortic aneurysm using [name] Gore - excluder  graft.  Risks and benefits have been thoroughly discussed with the  patient and he would like to proceed.   Troy [name], M.D.  Electronically Signed   Troy [name]/Troy [name]  D:  07/05/2009  T:  07/06/2009  Job:  [id number]   cc:   Troy [name], M.D.

## 2011-04-10 NOTE — Assessment & Plan Note (Signed)
OFFICE VISIT   Troy [name], Troy [name]  DOB:  06/28/1941                                       09/26/2010  [id number]#:[id number]   Patient returns today for continued follow-up regarding his infrarenal  abdominal aortic aneurysm stent graft that I inserted in March of this  year.  He had an aorto-bi-common iliac graft inserted (Gore) via  bilateral common femoral artery exposure.  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  The aneurysm  has been stable at just under 5 cm, which is the same as his  preoperative size.  He has had no abdominal or back symptoms.  He is on  chronic Coumadin for atrial fibrillation, which was started at this  coronary artery bypass grafting done by Dr. Laneta Simmers.  He has no chest  pain, dyspnea on exertion, or other cardiac symptoms and also denies  claudication.   CHRONIC MEDICAL PROBLEMS:  1. Coronary artery disease.  2. Hypertension.  3. Hyperlipidemia.  4. Type 2 diabetes mellitus.  5. Atrial fibrillation.  6. Degenerative joint disease, previous lumbar laminectomy.   FAMILY HISTORY:  Negative for coronary artery disease, diabetes, and  stroke.   SOCIAL HISTORY:  Has [name] 50-pack-year history of smoking.  Stopped in  2009.   PHYSICAL EXAMINATION:  Blood pressure is 130/83, heart rate 56,  respirations 14.  General:  He is [name] well-developed and well-nourished  male in no apparent distress, alert and oriented x3.  HEENT:  Normal  with the exception of left facial weakness due to left hemifacial  spasms.  EOMs intact.  Lungs:  Clear to auscultation.  No rhonchi or  wheezing.  Cardiovascular:  An irregular rhythm.  No murmurs.  Abdomen:  Soft, nontender with no pulsatile mass noted.  Musculoskeletal:  Free of  major deformities.  Lower extremity exam reveals 3+ femoral and  popliteal pulses bilaterally with well-healed  incisions in the inguinal  areas.   Today I ordered [name] CT angiogram by the radiologist, which I reviewed by  computer and compared to his previous studies.  I do not see any change  in either the endoleaks or the size of the aneurysm, which continues to  be about 5 cm.   The radiologist measured the aneurysm at [name] maximum of 5.3 cm.  I do not  think there has been significant change in the size of the aneurysm  comparing these 2 studies.   I discussed this at length with the patient today and the fact we will  need to continue to follow this to look for any significant enlargement  of the aneurysm, which I do not think has occurred at this point.  He  will return in 6 months with [name] follow-up CT angiogram at the time of his  return to follow this type 2 endoleak.     [name], M.D.  Electronically Signed   JDL/MEDQ  D:  09/26/2010  T:  09/27/2010  Job:  [id number]

## 2011-04-10 NOTE — Assessment & Plan Note (Signed)
OFFICE VISIT   MALLORY, ENRIQUES A  DOB:  1941-01-26                                       12/27/2009  [id number]#:[id number]   CHIEF COMPLAINT:  Infrarenal abdominal aortic aneurysm.   HISTORY OF PRESENT ILLNESS:  A 70 year old male patient is referred by  Dr. Mayford Knife for an infrarenal abdominal aortic aneurysm that has been  previously evaluated and found to be a good candidate for aortic stent  grafting.  This had been previously scheduled for September 16 but  because of an abnormal Cardiolite study he underwent coronary artery  bypass grafting by Dr. Laneta Simmers.  He had some confusion postoperatively  but no evidence of any stroke and has done well since that time on  cardiac rehab and now returns to have his aneurysm stent graft  performed.  The aneurysm is approximately 5 cm on CT and duplex  scanning.   PAST MEDICAL HISTORY:  1. Include coronary artery disease.  2. High blood pressure.  3. Hyperlipidemia.  4. Type 2 diabetes mellitus.  5. History of hemifacial spasm dating back to 2003.   PAST SURGICAL HISTORY:  1. Lumbar laminectomy.  2. Tonsillectomy.  3. Coronary artery bypass graft.   FAMILY HISTORY:  Negative for coronary artery disease, diabetes or  stroke.   SOCIAL HISTORY:  He is married, is retired, has a 50+ pack year history  of smoking but stopped in 2009.  Drinks occasional alcohol.   REVIEW OF SYSTEMS:  Denies any chest pain, dyspnea on exertion, PND,  orthopnea.  No GI or GU symptoms.  Denies claudication symptoms.  No  abdominal or back pain.  Does have history of some weight loss and stage  II kidney disease.  Otherwise all systems were negative.   PHYSICAL EXAMINATION:  Vital signs:  Blood pressure 139/93, heart rate  68, respirations 14, temperature 97.6.  General:  He is a well-  developed, well-nourished male who is in no apparent distress.  Alert  and oriented x3.  HEENT:  Intact.  EOMs intact.  Chest:  Clear to  auscultation.   Cardiovascular:  Regular rhythm.  No murmurs.  Abdomen:  Soft, nontender with 5 cm pulsatile mass.  Extremities:  Revealed 3+  femoral and posterior tibial pulses bilaterally.  Musculoskeletal:  Free  of major deformities.  Neurological:  Normal.  Skin:  Free of rashes.   IMPRESSION:  Infrarenal abdominal aortic aneurysm.   PLAN:  Admit the patient on March 3 for aortic stent grafting using a  Gore - excluder graft.  Risks and benefits have been thoroughly  discussed with the patient who would like to proceed.     [name], M.D.  Electronically Signed   JDL/MEDQ  D:  12/27/2009  T:  12/28/2009  Job:  [id number]   cc:   Armanda Magic, M.D.  Emeterio Reeve, MD

## 2011-04-10 NOTE — Consult Note (Signed)
NEW PATIENT CONSULTATION   Troy, [name] A  DOB:  [birth year]                                       06/20/2009  HYQMV#:[id number]   The patient is a 70 year old male patient referred by Dr. Mayford [name] for an  abdominal aortic aneurysm.  This patient had an ultrasound study earlier  this year for evaluation of hypertension and was noted to have an  aneurysm in the 4.5 cm to 4.6 cm range.  A repeat CT scan now reveals  this to approximate 5 cm in maximum diameter and he was referred for  evaluation.  He has no abdominal or back symptoms.   PAST MEDICAL HISTORY:  1. Hypertension.  2. Hyperlipidemia.  3. Negative for diabetes, coronary artery disease, COPD or stroke.   PREVIOUS SURGERY:  1. Lumbar laminectomy in 1998.  2. Tonsillectomy.   FAMILY HISTORY:  Negative for coronary artery disease, diabetes and  stroke.   SOCIAL HISTORY:  He is married.  He is retired.  He has a 50+ pack-year  smoking history but did stop in 2009 and has smoked up to 3 packs per  day in the past.  Drinks occasional alcohol.   REVIEW OF SYSTEMS:  Positive for weight gain, some dyspnea on exertion.  Denies any chest pain, hemoptysis, chronic wheezing.  No GI or GU  symptoms.  Denies claudication.  He has noted a rash in the lower legs  recently.   ALLERGIES:  To tetanus, penicillin and a beta blocker which caused an  anaphylactic reaction.   PHYSICAL EXAM:  Blood pressure 147/90, heart rate 60, respirations 18.  General:  He is alert and oriented x3.  Neck:  Supple, 3+ carotid pulses  palpable.  No bruits are audible.  Neurologic:  Normal.  No palpable  adenopathy in the neck.  Chest:  Clear to auscultation.  Cardiovascular:  Regular rhythm, no murmurs.  Abdomen:  Soft, nontender with a pulsatile  mass in the mid epigastric approximating 5 cm.  He has 3+ femoral,  popliteal and posterior tibial pulses bilaterally.   I reviewed the CT scan today which reveals an infrarenal aneurysm  which  approximates 5 cm in maximum diameter.  He does have an adequate  infrarenal neck for stent grafting as well as iliac arteries.  I do  think he would be a candidate for an aortic stent graft if he decides to  be treated.   We had a long discussion regarding open repair versus stent grafting and  the size of his aneurysm at the threshold for determining need for  treatment.  He would like to wait a few months before making a final  decision, so we will see him back in 6 months with a duplex scan.  He  will be seeing Dr. Mayford [name] later this week for a discussion about the  results of his nuclear stress test which was done a few weeks ago.  He  will return to see me in 6 months.   Troy [name], M.D.  Electronically Signed   JDL/MEDQ  D:  06/20/2009  T:  06/21/2009  Job:  [id number]   cc:   Troy [name], M.D.

## 2011-04-10 NOTE — Assessment & Plan Note (Signed)
OFFICE VISIT   Troy Troy [name], Troy Troy [name]  DOB:  May 13, 1941                                        September 26, 2009  CHART #:  [id number]   HISTORY:  The patient is Troy [name] who was recently  hospitalized and underwent coronary artery bypass grafting x3 for severe  three-vessel coronary artery disease.  The patient has recently been  evaluated for an abdominal aortic aneurysm measured at 4.6 cm and has  been seen by Dr. Hart Rochester for evaluation of resection and grafting versus  stent graft placement.  On his workup, he was found to have three-vessel  coronary artery disease and we proceeded with surgical revascularization  prior to repair of his aneurysm.  He underwent coronary artery bypass  grafting x3 on August 19, 2009, by Dr. Laneta Simmers.  He had Troy [name] left  internal mammary artery to the LAD, Troy [name] saphenous vein graft to the  diagonal branch of the LAD, and Troy [name] saphenous vein graft to the  posterolateral branch of the right coronary.  He had postoperative  atrial fibrillation but otherwise Troy [name] fairly unremarkable course.  Currently, he reports that he is using no pain medicine.  He is  increasing his activities and ambulation.  He has only minimal  discomfort in his sternal incision.  He denies fevers, chills, or other  constitutional symptoms.   DIAGNOSTIC TESTS:  Troy [name] chest x-ray was obtained on today's date and  reveals no acute findings.  The wires are intact.  There are no  significant infiltrates, effusions, or evidence of congestive failure.   PHYSICAL EXAMINATION:  Vital Signs:  Blood pressure is 144/91, pulse is  72, respirations 18, oxygen saturation is 98% on room air.  General:  This is Troy [name] well-developed adult male in no acute distress.  Pulmonary:  Clear lungs throughout.  Cardiac:  Regular rate and rhythm.  No murmurs,  gallops, or rubs.  Abdomen:  Soft, nontender.  Extremities:  Bilateral  lower extremity mild edema.  Incisions are all healing  without evidence  of infection.   ASSESSMENT:  The patient is making Troy [name] good recovery following his  surgical revascularization.  Plans are noted for his aneurysm repair in  March of next year.  He is encouraged to continue increasing his  ambulation.  He can begin to drive short distances.  He is continued on  some weight lifting restrictions that I discussed with him thoroughly.  I also discussed exercise and lifestyle management issues with him.  Overall, he is felt to be making good progress from his surgical  recovery and we will now follow him on Troy [name] p.r.n. basis for any surgically  related issues that may present.  We are certainly happy to see him at  any time.   Rowe Clack, P.Troy [name].-C.   Sherryll Burger  D:  09/26/2009  T:  09/27/2009  Job:  [id number]   cc:   Armanda Magic, M.D.  [name], M.D.

## 2011-04-10 NOTE — Assessment & Plan Note (Signed)
OFFICE VISIT   Troy [name], Troy [name] A  DOB:  1941/09/30                                       03/07/2010  [id number]#:[id number]   The patient returns 5 weeks post insertion of the Gore Excluder aorto-bi-  common iliac stent graft for infrarenal abdominal aortic aneurysm.  He  had no problems postoperatively and has done well since his discharge  hospital with a good appetite and increasing activity level.  He had  coronary artery bypass grafting in the fall and has done well following  that procedure as well.   PHYSICAL EXAMINATION:  Today his blood pressure 137/90, heart rate 65,  respirations 14.  Carotid pulses 3+, no audible bruits.  General:  He is  alert, oriented x3.  Chest:  Clear to auscultation.  Cardiovascular:  Regular rhythm, no murmurs.  Abdomen:  Soft, nontender with no pulsatile  mass noted.  Both inguinal incisions have healed nicely.  He has 3+  femoral and dorsalis pedis pulses bilaterally.   Today I ordered a CT angiogram and reviewed this.  The stent graft is in  excellent position and the size of the aneurysm is just under 5 cm,  consistent with preoperative size.  He does have a type 2 endo leak  which appears to arise from the inferior mesenteric artery and possibly  a couple of lumbar branches in the distal aorta.  I discussed this with  him and his wife at length today, explaining type 1 and type 2 endo  leaks, the fact that this was a type 2 leak which we will follow.  If he  has abdominal or back pain, he will return report to the emergency  department.  Otherwise I will see him in 6 months with follow-up CT  angiogram.  Hopefully this type 2 leak will close with time.  As long as  the aneurysm sac does not enlarged and the leak remains small, no  treatment will be necessary.     [name], M.D.  Electronically Signed   JDL/MEDQ  D:  03/07/2010  T:  03/08/2010  Job:  [id number]   cc:   Emeterio Reeve, MD  Armanda Magic, M.D.

## 2011-04-10 NOTE — Consult Note (Signed)
NEW PATIENT CONSULTATION   Troy [name], Troy [name]  DOB:  August 06, 1941                                        August 09, 2009  CHART #:  [id number]   REFERRING PHYSICIANS:  1. Troy C. Anne Fu, MD  2. Troy Magic, MD   REASON FOR CONSULTATION:  Severe three-vessel coronary artery disease.   CLINICAL HISTORY:  I was asked by Dr. Anne [name] to evaluate the patient for  coronary artery bypass graft surgery.  He is [name]  with [name] history of difficult to control hypertension requiring multiple  medications who has been followed by Dr. Armanda [name].  He was  apparently undergoing [name] workup to rule out renovascular hypertension and  had an abdominal ultrasound which revealed abdominal aortic aneurysm  that apparently was around 4.6 cm.  He was seen by Dr. Quita [name]. Hart [place]  and was being evaluated for stent graft implantation.  He underwent [name]  nuclear stress test in July 2010 that showed [name] small anterior apical  perfusion defect.  He subsequently went [name] coronary CT angiogram that  revealed an occluded diagonal and ectasia of the right and left coronary  arteries.  Ejection fraction was normal.  His calcium score was severely  elevated and therefore cardiac catheterization was recommended.  The  patient underwent this on July 29, 2009, which showed severe three-  vessel coronary disease.  The LAD had 80-90% proximal stenosis.  The  left circumflex had 99% stenosis before [name] marginal branch.  The right  coronary artery was [name] large dominant vessel that had 99% posterolateral  stenosis.  The ejection fraction was normal.   REVIEW OF SYSTEMS:  GENERAL:  He denies any fever or chills.  He has had  no recent weight changes.  His appetite has been good.  He does report  some fatigue and sleeps [name] lot during the day.  EYES:  Negative.  ENT:  Negative.  ENDOCRINE:  He denies diabetes.  He has been treated for hypothyroidism.  CARDIOVASCULAR:  He denies any chest pain  or pressure.  He denies  dyspnea on exertion.  He denies PND or orthopnea.  He has had no  peripheral edema or palpitations.  RESPIRATORY:  He denies cough or sputum production.  GI:  He denies nausea or vomiting.  He has had no melena or bright red  blood per rectum.  GU:  He denies dysuria and hematuria.  MUSCULOSKELETAL:  He denies arthralgias and myalgias.  NEUROLOGIC:  He denies any focal weakness or numbness.  He denies  dizziness and syncope.  He has never had [name] TIA or [name] stroke.  PSYCHIATRIC:  Negative.  HEMATOLOGIC:  He denies any history of bleeding problems.   ALLERGIES:  Tetanus, bee stings, penicillin, and Prinzide.   PAST MEDICAL HISTORY:  Significant for hypertension that has been  difficult control and hypothyroidism.  He has [name] history of facial palsy.  He has history of sleep apnea.  He has [name] history of dyslipidemia with  low HDL.  He has history of diverticulosis.  He has history of gout.  He  has history of stage II chronic kidney disease.  He has [name] history of  abdominal aortic aneurysm as mentioned above.  He is status post  diskectomy x2 in the past.  He is status post tonsillectomy and  adenoidectomy in the past.   SOCIAL HISTORY:  He is [name] previous smoker of 1-2 packs per day x40 years  but quit in 2009.  He drinks small amount of alcohol per week.  He is [name]  retired Comptroller and lives with his wife who is here with him  today.   FAMILY HISTORY:  His father died at 3 years old with Alzheimer's and  renal failure.  Mother died at 30 years old with heart failure and also  an Alzheimer's.  Another brother has Alzheimer disease.  Another brother  died from motor vehicle accident.   MEDICATIONS:  Clonidine 0.3 mg b.i.d., diltiazem 240 mg b.i.d.,  lisinopril/hydrochlorothiazide 20/25 one daily, Norvasc 2.5 mg daily,  Micardis 80 mg daily, Levoxyl 125 mcg daily, aspirin 81 mcg daily, Foltx  2.5/25/2 daily, fish oil 2000 mg daily, Botox injections as  needed for  his facial palsy, and EpiPen as needed for bee stings.   PHYSICAL EXAMINATION:  His blood pressure is 136/93 and his pulse is 78  and regular.  His respiratory rate 18 and unlabored.  Oxygen saturation  on room air is 93%.  He is [name] well-developed white male in no distress.  HEENT exam shows him to be normocephalic and atraumatic.  Pupils are  equal and reactive to light and accommodation.  Extraocular muscles are  intact.  Throat is clear.  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  Neck exam  shows normal carotid pulses bilaterally.  There are no bruits.  There is  no adenopathy or thyromegaly.  Cardiac exam shows [name] regular rate and  rhythm with normal S1 and S2.  There is no murmur, rub, or gallop.  His  lungs are clear.  Abdominal exam shows active bowel sounds.  His abdomen  is soft, mildly obese, and nontender.  There are no palpable masses or  organomegaly.  Extremity exam shows no peripheral edema.  Pedal pulses  are palpable bilaterally.  His skin is warm and dry.  Neurologic exam  shows him to be alert and oriented x3.  Motor and sensory exam are  grossly normal.   IMPRESSION:  The patient has severe three-vessel coronary disease with  an abnormal stress test and is relatively asymptomatic except for [name]  history of fatigue and excessive tiredness which may be related to  coronary ischemia.  I agree that coronary artery bypass graft surgery is  the best treatment to prevent further ischemia and infarction.  His  abdominal aortic aneurysm is stable and he can have this repaired after  he recovers from his coronary bypass surgery.  I discussed the operative  procedure with him and his wife including alternatives, benefits, and  risks including but not limited to bleeding, blood transfusion,  infection, stroke, myocardial infarction, graft failure, and death.  They understand and would like to proceed with surgery as soon as  possible.  We will plan to do this next week.   Evelene Croon, M.D.  Electronically Signed   BB/MEDQ  D:  08/09/2009  T:  08/10/2009  Job:  [id number]   cc:   Troy [name], M.D.

## 2011-04-11 NOTE — Assessment & Plan Note (Signed)
OFFICE VISIT  MACALLAN, ORD A DOB:  August 10, 1941                                       04/10/2011 ZOXWR#:[id number]  Patient is a 70 year old male patient status post aortic stent graft placed by me in March 2011 for an infrarenal abdominal aortic aneurysm using a Gore Excluder stent graft to both common iliac arteries.  The patient has done well with no abdominal or back symptoms.  I ordered a CT angiogram, which I have reviewed today by computer.  This reveals that the aneurysm has a maximum diameter similar to last visit of about 5.1 cm.  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  He is on chronic Coumadin for atrial fibrillation, which may be a factor in this type 2 leak remaining patent.  CHRONIC MEDICAL PROBLEMS: 1. Hypertension. 2. Type 2 diabetes mellitus. 3. Hyperlipidemia. 4. Coronary artery disease. 5. Atrial fibrillation, on Coumadin. 6. Degenerative joint disease, previous lumbar laminectomy.  FAMILY HISTORY:  Negative for coronary disease, diabetes, and stroke.  SOCIAL HISTORY:  He has a 50 pack-year history of smoking.  Stopped in 2009.  REVIEW OF SYSTEMS:  Denies any chest pain, dyspnea on exertion, PND, orthopnea.  No neurologic symptoms.  He denies reflux esophagitis.  All other systems are negative in complete review of systems.  PHYSICAL EXAMINATION:  Blood pressure 137/85, heart rate 87, respirations 24.  General:  He is a well-developed and well-nourished male who is in no apparent distress, alert and oriented x3.  HEENT: Normal for age.  EOMs intact.  Lungs:  Clear to auscultation.  No rhonchi or wheezing.  Cardiovascular exam reveals an irregularly irregular rhythm.  No murmurs.  Carotid pulses are 3+.  No bruits. Abdomen:  Soft, nontender.  No pulsatile masses noted.  Musculoskeletal: Free of major deformities.   Neurologic:  Normal.  Skin:  Free of rashes. He has 3+ femoral and posterior tibial pulses palpable.  I discussed at length with the patient and his wife the fact that he does have a type 2 endoleak, which should be no concerns as long as the aneurysm sac is not enlarging.  We will continue to follow this.  We will see him in 1 year with a CT angiogram at that time unless he develops any new symptoms in the interim.    [name], M.D. Electronically Signed  JDL/MEDQ  D:  04/10/2011  T:  04/11/2011  Job:  [id number]  cc:   Armanda Magic, M.D. Dr. Laurine Blazer

## 2011-10-07 DIAGNOSIS — R1032 Left lower quadrant pain: Secondary | ICD-10-CM | POA: Insufficient documentation

## 2011-10-07 DIAGNOSIS — Z79899 Other long term (current) drug therapy: Secondary | ICD-10-CM | POA: Insufficient documentation

## 2011-10-07 DIAGNOSIS — I2510 Atherosclerotic heart disease of native coronary artery without angina pectoris: Secondary | ICD-10-CM | POA: Insufficient documentation

## 2011-10-07 DIAGNOSIS — R109 Unspecified abdominal pain: Secondary | ICD-10-CM | POA: Insufficient documentation

## 2011-10-07 DIAGNOSIS — R339 Retention of urine, unspecified: Secondary | ICD-10-CM | POA: Insufficient documentation

## 2011-10-07 DIAGNOSIS — K5900 Constipation, unspecified: Secondary | ICD-10-CM | POA: Insufficient documentation

## 2011-10-07 DIAGNOSIS — I10 Essential (primary) hypertension: Secondary | ICD-10-CM | POA: Insufficient documentation

## 2011-10-07 DIAGNOSIS — E119 Type 2 diabetes mellitus without complications: Secondary | ICD-10-CM | POA: Insufficient documentation

## 2011-10-08 ENCOUNTER — Emergency Department (HOSPITAL_COMMUNITY): Payer: Medicare Other

## 2011-10-08 ENCOUNTER — Encounter (HOSPITAL_COMMUNITY): Payer: Self-pay | Admitting: *Deleted

## 2011-10-08 ENCOUNTER — Emergency Department (HOSPITAL_COMMUNITY)
Admission: EM | Admit: 2011-10-08 | Discharge: 2011-10-08 | Disposition: A | Discharge status: Home / Self Care | Payer: Medicare Other | Attending: Emergency Medicine | Admitting: Emergency Medicine

## 2011-10-08 DIAGNOSIS — R339 Retention of urine, unspecified: Secondary | ICD-10-CM

## 2011-10-08 HISTORY — DX: Essential (primary) hypertension: I10

## 2011-10-08 HISTORY — DX: Atherosclerotic heart disease of native coronary artery without angina pectoris: I25.10

## 2011-10-08 HISTORY — DX: Clonic hemifacial spasm, unspecified: G51.39

## 2011-10-08 LAB — URINALYSIS, ROUTINE W REFLEX MICROSCOPIC
Glucose, UA: NEGATIVE mg/dL
Leukocytes, UA: NEGATIVE
Nitrite: NEGATIVE
Protein, ur: 100 mg/dL — AB
Urobilinogen, UA: 1 mg/dL (ref 0.0–1.0)

## 2011-10-08 LAB — URINE MICROSCOPIC-ADD ON

## 2011-10-08 MED ORDER — MAGNESIUM CITRATE PO SOLN: 296.0000 mL | Freq: Once | ORAL | Status: AC

## 2011-10-08 NOTE — ED Notes (Signed)
Patient is resting comfortably. No distress noted. Family at bedside. 

## 2011-10-08 NOTE — ED Notes (Signed)
Pt provided with leg back. Wife instructed on use. No distress noted.

## 2011-10-08 NOTE — ED Notes (Addendum)
C/o constipation for ~ 1 week, "last BM > 1 week", has tried miralax w/o relief, has used prn, has not needed on a regular basis. Pt of Dr. Paulino Rily [facility]), also [facility]. (denies: nvd or fever), "now not being able to pass urine as well", last void Sunday afternoon. C/o abd & rectal pain.

## 2011-10-08 NOTE — ED Provider Notes (Signed)
History     CSN: [id number] Arrival date & time: 10/08/2011 12:39 AM   First MD Initiated Contact with Patient 10/08/11 0231      Chief Complaint  Patient presents with  . Constipation    (Consider location/radiation/quality/duration/timing/severity/associated sxs/prior treatment) HPI Comments: No f/c/n/v/back pain, weakness, ha, cough or other sx  Patient is a 70 y.o. male presenting with constipation. The history is provided by the patient and a relative.  Constipation  The current episode started more than 1 week ago. The onset was gradual. The problem occurs continuously. The problem has been gradually worsening. The pain is moderate. The stool is described as hard. There was no prior successful therapy. Prior unsuccessful therapies include laxatives. Associated symptoms include abdominal pain. Pertinent negatives include no fever, no diarrhea, no hematemesis, no hemorrhoids, no nausea, no vomiting and no hematuria. Associated symptoms comments: Urinary retention.    Past Medical History  Diagnosis Date  . Coronary artery disease   . Constipation   . Hypertension   . Diabetes mellitus   . Thyroid disease   . Facial nerve spasm     L eye, tx'd with botox    Past Surgical History  Procedure Date  . Cardiac surgery   . Abdominal aortic aneurysm repair   . Back surgery     History reviewed. No pertinent family history.  History  Substance Use Topics  . Smoking status: Former Smoker    Quit date: 10/07/2008  . Smokeless tobacco: Not on file  . Alcohol Use: 0.6 oz/week    1 Glasses of wine per week      Review of Systems  Constitutional: Negative for fever.  Gastrointestinal: Positive for abdominal pain and constipation. Negative for nausea, vomiting, diarrhea, hematemesis and hemorrhoids.  Genitourinary: Negative for hematuria.  All other systems reviewed and are negative.    Allergies  Penicillins and Tetanus toxoids  Home Medications   Current  Outpatient Rx  Name Route Sig Dispense Refill  . AMLODIPINE BESYLATE 10 MG PO TABS Oral Take 10 mg by mouth daily.      . ASPIRIN 81 MG PO TABS Oral Take 81 mg by mouth daily.      Marland Kitchen DIGOXIN 0.125 MG PO TABS Oral Take 125 mcg by mouth daily.      . STOOL SOFTENER PO Oral Take 1 tablet by mouth at bedtime as needed. constipation     . FA-PYRIDOXINE-CYANCOBALAMIN 2.5-25-2 MG PO TABS Oral Take 1 tablet by mouth at bedtime.      Marland Kitchen LEVOTHYROXINE SODIUM 125 MCG PO TABS Oral Take 125 mcg by mouth daily.      Marland Kitchen LISINOPRIL 40 MG PO TABS Oral Take 40 mg by mouth daily.      Marland Kitchen METOPROLOL TARTRATE PO Oral Take 25 mg by mouth 2 (two) times daily.      . ICAPS PO CAPS Oral Take 1 capsule by mouth at bedtime.      Marland Kitchen ROSUVASTATIN CALCIUM 10 MG PO TABS Oral Take 5 mg by mouth at bedtime.      . WARFARIN SODIUM 7.5 MG PO TABS Oral Take 7.5 mg by mouth at bedtime.      Marland Kitchen MAGNESIUM CITRATE PO SOLN Oral Take 296 mLs by mouth once. OTC 300 mL 0    BP 128/80  Pulse 80  Temp(Src) 99.2 F (37.3 C) (Oral)  Resp 20  SpO2 98%  Physical Exam  Nursing note and vitals reviewed. Constitutional: He appears well-developed and well-nourished. No distress.  HENT:  Head: Normocephalic and atraumatic.  Mouth/Throat: Oropharynx is clear and moist. No oropharyngeal exudate.  Eyes: Conjunctivae and EOM are normal. Pupils are equal, round, and reactive to light. Right eye exhibits no discharge. Left eye exhibits no discharge. No scleral icterus.  Neck: Normal range of motion. Neck supple. No JVD present. No thyromegaly present.  Cardiovascular: Normal rate, regular rhythm, normal heart sounds and intact distal pulses.  Exam reveals no gallop and no friction rub.   No murmur heard. Pulmonary/Chest: Effort normal and breath sounds normal. No respiratory distress. He has no wheezes. He has no rales.  Abdominal: Soft. Bowel sounds are normal. He exhibits no distension and no mass. There is tenderness ( mild LLQ ttp, no  guasrding or mass).  Genitourinary: Penis normal.  Musculoskeletal: Normal range of motion. He exhibits no edema and no tenderness.  Lymphadenopathy:    He has no cervical adenopathy.  Neurological: He is alert. Coordination normal.  Skin: Skin is warm and dry. No rash noted. No erythema.  Psychiatric: He has a normal mood and affect. His behavior is normal.    ED Course  Procedures (including critical care time)  Labs Reviewed  URINALYSIS, ROUTINE W REFLEX MICROSCOPIC - Abnormal; Notable for the following:    Appearance HAZY (*)    Protein, ur 100 (*)    All other components within normal limits  URINE MICROSCOPIC-ADD ON  URINE CULTURE   Dg Abd 1 View  10/08/2011  *RADIOLOGY REPORT*  Clinical Data: Unable to void or have a bowel movement.  ABDOMEN - 1 VIEW  Comparison: CTA of the abdomen and pelvis performed 09/25/2010  Findings: The visualized bowel gas pattern is unremarkable. Scattered air and stool filled loops of colon are seen; no abnormal dilatation of small bowel loops is seen to suggest small bowel obstruction.  A moderate to large amount of stool is noted within the rectum, without evidence of significant constipation.  No free intra-abdominal air is identified, though evaluation for free air is limited on a single supine view.  An aorto-iliac stent graft is again noted.  Clips are seen at the right inguinal region.  The visualized osseous structures are within normal limits; the sacroiliac joints are unremarkable in appearance.  The visualized lung bases are essentially clear.  IMPRESSION: Unremarkable bowel gas pattern; moderate to large amount of stool within the rectum, without evidence of significant constipation. No free intra-abdominal air seen.  Original Report Authenticated By: Tonia Ghent, M.D.     1. Urinary retention   2. Constipation       MDM  Pt likley has urinary retention from constipation - has never had retention before - no urine in 12 hours - had >  1300cc in bladder on foley placement, stronger laxative, pain completely relieved afterf oley placement.  VS otherwise unremarkable - no UTI.  meds rec for home and f/u with Uro        Vida Roller, MD 10/08/11 [phone]

## 2011-10-08 NOTE — ED Notes (Signed)
Pt c/o constipation for approx 1 week. Pt also states today has been having trouble urinating as well. Pt states feels pressure to urinate but can't. Pt c/o generalized abd pain.

## 2011-10-09 LAB — URINE CULTURE

## 2011-11-14 ENCOUNTER — Other Ambulatory Visit: Payer: Self-pay | Admitting: Family Medicine

## 2011-11-14 ENCOUNTER — Ambulatory Visit
Admission: RE | Admit: 2011-11-14 | Discharge: 2011-11-14 | Disposition: A | Discharge status: Home / Self Care | Payer: Medicare Other | Source: Ambulatory Visit | Attending: Family Medicine | Admitting: Family Medicine

## 2011-11-14 DIAGNOSIS — J189 Pneumonia, unspecified organism: Secondary | ICD-10-CM

## 2011-12-10 DIAGNOSIS — I4891 Unspecified atrial fibrillation: Secondary | ICD-10-CM | POA: Diagnosis not present

## 2011-12-10 DIAGNOSIS — Z7901 Long term (current) use of anticoagulants: Secondary | ICD-10-CM | POA: Diagnosis not present

## 2011-12-17 DIAGNOSIS — R413 Other amnesia: Secondary | ICD-10-CM | POA: Diagnosis not present

## 2011-12-17 DIAGNOSIS — G9341 Metabolic encephalopathy: Secondary | ICD-10-CM | POA: Diagnosis not present

## 2011-12-17 DIAGNOSIS — R269 Unspecified abnormalities of gait and mobility: Secondary | ICD-10-CM | POA: Diagnosis not present

## 2011-12-20 ENCOUNTER — Ambulatory Visit: Payer: Medicare Other | Attending: Neurology | Admitting: Physical Therapy

## 2011-12-20 DIAGNOSIS — IMO0001 Reserved for inherently not codable concepts without codable children: Secondary | ICD-10-CM | POA: Insufficient documentation

## 2011-12-20 DIAGNOSIS — M6281 Muscle weakness (generalized): Secondary | ICD-10-CM | POA: Diagnosis not present

## 2011-12-20 DIAGNOSIS — R269 Unspecified abnormalities of gait and mobility: Secondary | ICD-10-CM | POA: Insufficient documentation

## 2011-12-25 ENCOUNTER — Ambulatory Visit: Payer: Medicare Other | Admitting: Physical Therapy

## 2011-12-25 DIAGNOSIS — M6281 Muscle weakness (generalized): Secondary | ICD-10-CM | POA: Diagnosis not present

## 2011-12-25 DIAGNOSIS — R269 Unspecified abnormalities of gait and mobility: Secondary | ICD-10-CM | POA: Diagnosis not present

## 2011-12-25 DIAGNOSIS — IMO0001 Reserved for inherently not codable concepts without codable children: Secondary | ICD-10-CM | POA: Diagnosis not present

## 2011-12-27 ENCOUNTER — Ambulatory Visit: Payer: Medicare Other | Admitting: Physical Therapy

## 2011-12-27 DIAGNOSIS — M6281 Muscle weakness (generalized): Secondary | ICD-10-CM | POA: Diagnosis not present

## 2011-12-27 DIAGNOSIS — R269 Unspecified abnormalities of gait and mobility: Secondary | ICD-10-CM | POA: Diagnosis not present

## 2011-12-27 DIAGNOSIS — IMO0001 Reserved for inherently not codable concepts without codable children: Secondary | ICD-10-CM | POA: Diagnosis not present

## 2011-12-31 ENCOUNTER — Ambulatory Visit: Payer: Medicare Other | Attending: Neurology | Admitting: Physical Therapy

## 2011-12-31 ENCOUNTER — Encounter: Payer: Medicare Other | Admitting: Physical Therapy

## 2011-12-31 DIAGNOSIS — IMO0001 Reserved for inherently not codable concepts without codable children: Secondary | ICD-10-CM | POA: Insufficient documentation

## 2011-12-31 DIAGNOSIS — R269 Unspecified abnormalities of gait and mobility: Secondary | ICD-10-CM | POA: Insufficient documentation

## 2011-12-31 DIAGNOSIS — M6281 Muscle weakness (generalized): Secondary | ICD-10-CM | POA: Diagnosis not present

## 2011-12-31 DIAGNOSIS — Z7901 Long term (current) use of anticoagulants: Secondary | ICD-10-CM | POA: Diagnosis not present

## 2011-12-31 DIAGNOSIS — I4891 Unspecified atrial fibrillation: Secondary | ICD-10-CM | POA: Diagnosis not present

## 2012-01-04 ENCOUNTER — Ambulatory Visit: Payer: Medicare Other | Admitting: Physical Therapy

## 2012-01-07 DIAGNOSIS — L989 Disorder of the skin and subcutaneous tissue, unspecified: Secondary | ICD-10-CM | POA: Diagnosis not present

## 2012-01-07 DIAGNOSIS — L408 Other psoriasis: Secondary | ICD-10-CM | POA: Diagnosis not present

## 2012-01-07 DIAGNOSIS — E1129 Type 2 diabetes mellitus with other diabetic kidney complication: Secondary | ICD-10-CM | POA: Diagnosis not present

## 2012-01-07 DIAGNOSIS — Z0000 Encounter for general adult medical examination without abnormal findings: Secondary | ICD-10-CM | POA: Diagnosis not present

## 2012-01-07 DIAGNOSIS — Z79899 Other long term (current) drug therapy: Secondary | ICD-10-CM | POA: Diagnosis not present

## 2012-01-07 DIAGNOSIS — E559 Vitamin D deficiency, unspecified: Secondary | ICD-10-CM | POA: Diagnosis not present

## 2012-01-07 DIAGNOSIS — Z125 Encounter for screening for malignant neoplasm of prostate: Secondary | ICD-10-CM | POA: Diagnosis not present

## 2012-01-08 ENCOUNTER — Ambulatory Visit: Payer: Medicare Other | Admitting: Physical Therapy

## 2012-01-10 ENCOUNTER — Ambulatory Visit: Payer: Medicare Other | Admitting: Physical Therapy

## 2012-01-14 DIAGNOSIS — Z7901 Long term (current) use of anticoagulants: Secondary | ICD-10-CM | POA: Diagnosis not present

## 2012-01-14 DIAGNOSIS — I4891 Unspecified atrial fibrillation: Secondary | ICD-10-CM | POA: Diagnosis not present

## 2012-01-15 ENCOUNTER — Ambulatory Visit: Payer: Medicare Other | Admitting: Physical Therapy

## 2012-01-17 ENCOUNTER — Ambulatory Visit: Payer: Medicare Other | Admitting: Physical Therapy

## 2012-02-01 DIAGNOSIS — Z7901 Long term (current) use of anticoagulants: Secondary | ICD-10-CM | POA: Diagnosis not present

## 2012-02-01 DIAGNOSIS — I4891 Unspecified atrial fibrillation: Secondary | ICD-10-CM | POA: Diagnosis not present

## 2012-02-04 ENCOUNTER — Other Ambulatory Visit: Payer: Self-pay | Admitting: *Deleted

## 2012-02-04 DIAGNOSIS — I714 Abdominal aortic aneurysm, without rupture: Secondary | ICD-10-CM

## 2012-02-06 DIAGNOSIS — H524 Presbyopia: Secondary | ICD-10-CM | POA: Diagnosis not present

## 2012-02-06 DIAGNOSIS — H2510 Age-related nuclear cataract, unspecified eye: Secondary | ICD-10-CM | POA: Diagnosis not present

## 2012-02-06 DIAGNOSIS — H40019 Open angle with borderline findings, low risk, unspecified eye: Secondary | ICD-10-CM | POA: Diagnosis not present

## 2012-02-06 DIAGNOSIS — H43399 Other vitreous opacities, unspecified eye: Secondary | ICD-10-CM | POA: Diagnosis not present

## 2012-02-06 DIAGNOSIS — H353190 Nonexudative age-related macular degeneration, unspecified eye, stage unspecified: Secondary | ICD-10-CM | POA: Diagnosis not present

## 2012-02-11 DIAGNOSIS — R269 Unspecified abnormalities of gait and mobility: Secondary | ICD-10-CM | POA: Diagnosis not present

## 2012-02-11 DIAGNOSIS — G518 Other disorders of facial nerve: Secondary | ICD-10-CM | POA: Diagnosis not present

## 2012-02-28 DIAGNOSIS — C44320 Squamous cell carcinoma of skin of unspecified parts of face: Secondary | ICD-10-CM | POA: Diagnosis not present

## 2012-02-28 DIAGNOSIS — L819 Disorder of pigmentation, unspecified: Secondary | ICD-10-CM | POA: Diagnosis not present

## 2012-02-28 DIAGNOSIS — D485 Neoplasm of uncertain behavior of skin: Secondary | ICD-10-CM | POA: Diagnosis not present

## 2012-02-28 DIAGNOSIS — D1739 Benign lipomatous neoplasm of skin and subcutaneous tissue of other sites: Secondary | ICD-10-CM | POA: Diagnosis not present

## 2012-02-28 DIAGNOSIS — L821 Other seborrheic keratosis: Secondary | ICD-10-CM | POA: Diagnosis not present

## 2012-02-28 DIAGNOSIS — Z85828 Personal history of other malignant neoplasm of skin: Secondary | ICD-10-CM | POA: Diagnosis not present

## 2012-03-07 DIAGNOSIS — Z79899 Other long term (current) drug therapy: Secondary | ICD-10-CM | POA: Diagnosis not present

## 2012-03-07 DIAGNOSIS — I4891 Unspecified atrial fibrillation: Secondary | ICD-10-CM | POA: Diagnosis not present

## 2012-03-07 DIAGNOSIS — Z7901 Long term (current) use of anticoagulants: Secondary | ICD-10-CM | POA: Diagnosis not present

## 2012-03-07 DIAGNOSIS — E7800 Pure hypercholesterolemia, unspecified: Secondary | ICD-10-CM | POA: Diagnosis not present

## 2012-03-26 HISTORY — PX: EYE SURGERY: SHX253

## 2012-03-28 ENCOUNTER — Other Ambulatory Visit: Payer: Self-pay | Admitting: Vascular Surgery

## 2012-03-28 DIAGNOSIS — I714 Abdominal aortic aneurysm, without rupture: Secondary | ICD-10-CM | POA: Diagnosis not present

## 2012-03-28 LAB — CREATININE, SERUM: Creat: 0.82 mg/dL (ref 0.50–1.35)

## 2012-03-28 LAB — BUN: BUN: 15 mg/dL (ref 6–23)

## 2012-03-31 ENCOUNTER — Ambulatory Visit
Admission: RE | Admit: 2012-03-31 | Discharge: 2012-03-31 | Disposition: A | Discharge status: Home / Self Care | Payer: Medicare Other | Source: Ambulatory Visit | Attending: Vascular Surgery | Admitting: Vascular Surgery

## 2012-03-31 DIAGNOSIS — I714 Abdominal aortic aneurysm, without rupture: Secondary | ICD-10-CM | POA: Diagnosis not present

## 2012-03-31 MED ORDER — IOHEXOL 350 MG/ML SOLN
100.0000 mL | Freq: Once | INTRAVENOUS | Status: AC | PRN
  Administered 2012-03-31: 100 mL via INTRAVENOUS

## 2012-04-01 ENCOUNTER — Ambulatory Visit: Payer: Medicare Other | Admitting: Vascular Surgery

## 2012-04-01 DIAGNOSIS — H269 Unspecified cataract: Secondary | ICD-10-CM | POA: Diagnosis not present

## 2012-04-01 DIAGNOSIS — H538 Other visual disturbances: Secondary | ICD-10-CM | POA: Diagnosis not present

## 2012-04-01 DIAGNOSIS — H2510 Age-related nuclear cataract, unspecified eye: Secondary | ICD-10-CM | POA: Diagnosis not present

## 2012-04-03 DIAGNOSIS — I2510 Atherosclerotic heart disease of native coronary artery without angina pectoris: Secondary | ICD-10-CM | POA: Diagnosis not present

## 2012-04-03 DIAGNOSIS — I4891 Unspecified atrial fibrillation: Secondary | ICD-10-CM | POA: Diagnosis not present

## 2012-04-03 DIAGNOSIS — Z79899 Other long term (current) drug therapy: Secondary | ICD-10-CM | POA: Diagnosis not present

## 2012-04-03 DIAGNOSIS — I10 Essential (primary) hypertension: Secondary | ICD-10-CM | POA: Diagnosis not present

## 2012-04-03 DIAGNOSIS — Z7901 Long term (current) use of anticoagulants: Secondary | ICD-10-CM | POA: Diagnosis not present

## 2012-04-03 DIAGNOSIS — E7800 Pure hypercholesterolemia, unspecified: Secondary | ICD-10-CM | POA: Diagnosis not present

## 2012-04-08 ENCOUNTER — Encounter: Payer: Self-pay | Admitting: Vascular Surgery

## 2012-04-14 ENCOUNTER — Encounter: Payer: Self-pay | Admitting: Vascular Surgery

## 2012-04-15 ENCOUNTER — Encounter: Payer: Self-pay | Admitting: Vascular Surgery

## 2012-04-15 ENCOUNTER — Ambulatory Visit (INDEPENDENT_AMBULATORY_CARE_PROVIDER_SITE_OTHER): Payer: Medicare Other | Admitting: Vascular Surgery

## 2012-04-15 VITALS — BP 143/86 | HR 63 | Resp 20 | Ht 68.0 in | Wt 196.0 lb

## 2012-04-15 DIAGNOSIS — I714 Abdominal aortic aneurysm, without rupture: Secondary | ICD-10-CM

## 2012-04-15 DIAGNOSIS — Z48812 Encounter for surgical aftercare following surgery on the circulatory system: Secondary | ICD-10-CM

## 2012-04-15 NOTE — Progress Notes (Signed)
Subjective:     Patient ID: Troy [name], male   DOB: 04-14-1941, 71 y.o.   MRN: [id number]  HPI this 71 year old male returns for continued followup regarding his aortic stent graft which inserted in March of 2011 using a Gore excluder stent graft both common iliac arteries. He has done well following the procedure he denies any abdominal or back symptoms today. His wife notes that he has become slightly less stable when ambulating and continues to take some physical therapy. He does have a good appetite and is able to ambulate fairly well. He has had a known type II endoleak and the aneurysm has been stable at around 51 mm in size. He is on chronic Coumadin for atrial fibrillation.  Past Medical History  Diagnosis Date  . Coronary artery disease   . Constipation   . Hypertension   . Diabetes mellitus   . Thyroid disease   . Facial nerve spasm     L eye, tx'd with botox  . Hyperlipidemia   . AAA (abdominal aortic aneurysm)     History  Substance Use Topics  . Smoking status: Former Smoker -- 15 years    Types: Cigarettes    Quit date: 10/07/2008  . Smokeless tobacco: Never Used  . Alcohol Use: 0.6 oz/week    1 Glasses of wine per week    No family history on file.  Allergies  Allergen Reactions  . Penicillins Rash  . Tetanus Toxoids Rash    Current outpatient prescriptions:amLODipine (NORVASC) 10 MG tablet, Take 10 mg by mouth daily.  , Disp: , Rfl: ;  aspirin 81 MG tablet, Take 81 mg by mouth daily.  , Disp: , Rfl: ;  calcium-vitamin D (OSCAL WITH D) 500-200 MG-UNIT per tablet, Take 2 tablets by mouth daily., Disp: , Rfl: ;  digoxin (LANOXIN) 0.125 MG tablet, Take 125 mcg by mouth daily.  , Disp: , Rfl:  Docusate Calcium (STOOL SOFTENER PO), Take 1 tablet by mouth at bedtime as needed. constipation , Disp: , Rfl: ;  fish oil-omega-3 fatty acids 1000 MG capsule, Take 2 g by mouth daily., Disp: , Rfl: ;  levothyroxine (SYNTHROID, LEVOTHROID) 125 MCG tablet, Take 125 mcg by  mouth daily.  , Disp: , Rfl: ;  lisinopril (PRINIVIL,ZESTRIL) 40 MG tablet, Take 40 mg by mouth daily.  , Disp: , Rfl:  METOPROLOL TARTRATE PO, Take 25 mg by mouth 2 (two) times daily.  , Disp: , Rfl: ;  Multiple Vitamins-Minerals (ICAPS) CAPS, Take 1 capsule by mouth at bedtime.  , Disp: , Rfl: ;  rosuvastatin (CRESTOR) 10 MG tablet, Take 5 mg by mouth at bedtime. , Disp: , Rfl: ;  warfarin (COUMADIN) 7.5 MG tablet, Take 7.5 mg by mouth at bedtime.  , Disp: , Rfl:  folic acid-pyridoxine-cyancobalamin (FOLTX) 2.5-25-2 MG TABS, Take 1 tablet by mouth at bedtime.  , Disp: , Rfl:   BP 143/86  Pulse 63  Resp 20  Ht 5\' 8"  (1.727 m)  Wt 196 lb (88.905 kg)  BMI 29.80 kg/m2  Body mass index is 29.80 kg/(m^2).        Review of Systems    chest pain, dyspnea on exertion, PND, orthopnea, chronic bronchitis, hemoptysis, claudication. Does have instability of gait at times. Objective:   Physical Exam blood pressure 143/63 heart rate 63 respirations 20 General well-developed well-nourished male in no apparent distress alert and oriented x3 Chest no rhonchi or wheezing Cardiovascular irregularly irregular rhythm no murmurs Abdomen soft nontender  no. Mass Lower extremities with 3+ femoral and posterior tibial pulses palpable bilaterally-no edema  Today I ordered a CT angiogram which are reviewed by computer. He continues to have a small type II endoleak arising from a lumbar branch. Aneurysm is 49 mm in maximum diameter.     Assessment:     Doing well post aortobifem and iliac stent graft and 2011 for abdominal aortic aneurysm Small type II endoleak-persistent-no enlargement of aneurysm sac    Plan:      Will see patient in one year with CT angiogram as today for continued follow-up   If no significant change in one year we'll alternate duplex scans with CT angiograms on annual basis

## 2012-04-16 NOTE — Progress Notes (Signed)
Addended by: Sharee Pimple on: 04/16/2012 09:15 AM   Modules accepted: Orders

## 2012-04-22 DIAGNOSIS — C44320 Squamous cell carcinoma of skin of unspecified parts of face: Secondary | ICD-10-CM | POA: Diagnosis not present

## 2012-04-22 DIAGNOSIS — L570 Actinic keratosis: Secondary | ICD-10-CM | POA: Diagnosis not present

## 2012-04-22 DIAGNOSIS — L905 Scar conditions and fibrosis of skin: Secondary | ICD-10-CM | POA: Diagnosis not present

## 2012-04-26 HISTORY — PX: EYE SURGERY: SHX253

## 2012-04-28 DIAGNOSIS — H2510 Age-related nuclear cataract, unspecified eye: Secondary | ICD-10-CM | POA: Diagnosis not present

## 2012-05-01 DIAGNOSIS — L738 Other specified follicular disorders: Secondary | ICD-10-CM | POA: Diagnosis not present

## 2012-05-01 DIAGNOSIS — L408 Other psoriasis: Secondary | ICD-10-CM | POA: Diagnosis not present

## 2012-05-01 DIAGNOSIS — Z7901 Long term (current) use of anticoagulants: Secondary | ICD-10-CM | POA: Diagnosis not present

## 2012-05-01 DIAGNOSIS — L570 Actinic keratosis: Secondary | ICD-10-CM | POA: Diagnosis not present

## 2012-05-01 DIAGNOSIS — Z85828 Personal history of other malignant neoplasm of skin: Secondary | ICD-10-CM | POA: Diagnosis not present

## 2012-05-01 DIAGNOSIS — I4891 Unspecified atrial fibrillation: Secondary | ICD-10-CM | POA: Diagnosis not present

## 2012-05-06 DIAGNOSIS — H2510 Age-related nuclear cataract, unspecified eye: Secondary | ICD-10-CM | POA: Diagnosis not present

## 2012-05-06 DIAGNOSIS — H269 Unspecified cataract: Secondary | ICD-10-CM | POA: Diagnosis not present

## 2012-06-03 DIAGNOSIS — I4891 Unspecified atrial fibrillation: Secondary | ICD-10-CM | POA: Diagnosis not present

## 2012-06-03 DIAGNOSIS — Z7901 Long term (current) use of anticoagulants: Secondary | ICD-10-CM | POA: Diagnosis not present

## 2012-06-30 DIAGNOSIS — I4891 Unspecified atrial fibrillation: Secondary | ICD-10-CM | POA: Diagnosis not present

## 2012-06-30 DIAGNOSIS — Z7901 Long term (current) use of anticoagulants: Secondary | ICD-10-CM | POA: Diagnosis not present

## 2012-07-07 DIAGNOSIS — E039 Hypothyroidism, unspecified: Secondary | ICD-10-CM | POA: Diagnosis not present

## 2012-07-07 DIAGNOSIS — N182 Chronic kidney disease, stage 2 (mild): Secondary | ICD-10-CM | POA: Diagnosis not present

## 2012-07-07 DIAGNOSIS — E1129 Type 2 diabetes mellitus with other diabetic kidney complication: Secondary | ICD-10-CM | POA: Diagnosis not present

## 2012-07-07 DIAGNOSIS — R269 Unspecified abnormalities of gait and mobility: Secondary | ICD-10-CM | POA: Diagnosis not present

## 2012-07-07 DIAGNOSIS — R5383 Other fatigue: Secondary | ICD-10-CM | POA: Diagnosis not present

## 2012-07-07 DIAGNOSIS — E669 Obesity, unspecified: Secondary | ICD-10-CM | POA: Diagnosis not present

## 2012-07-07 DIAGNOSIS — K5909 Other constipation: Secondary | ICD-10-CM | POA: Diagnosis not present

## 2012-07-29 DIAGNOSIS — I4891 Unspecified atrial fibrillation: Secondary | ICD-10-CM | POA: Diagnosis not present

## 2012-07-29 DIAGNOSIS — Z7901 Long term (current) use of anticoagulants: Secondary | ICD-10-CM | POA: Diagnosis not present

## 2012-08-21 DIAGNOSIS — Z23 Encounter for immunization: Secondary | ICD-10-CM | POA: Diagnosis not present

## 2012-09-08 DIAGNOSIS — E7800 Pure hypercholesterolemia, unspecified: Secondary | ICD-10-CM | POA: Diagnosis not present

## 2012-09-08 DIAGNOSIS — I4891 Unspecified atrial fibrillation: Secondary | ICD-10-CM | POA: Diagnosis not present

## 2012-09-08 DIAGNOSIS — Z7901 Long term (current) use of anticoagulants: Secondary | ICD-10-CM | POA: Diagnosis not present

## 2012-09-15 DIAGNOSIS — R5381 Other malaise: Secondary | ICD-10-CM | POA: Diagnosis not present

## 2012-09-15 DIAGNOSIS — E1129 Type 2 diabetes mellitus with other diabetic kidney complication: Secondary | ICD-10-CM | POA: Diagnosis not present

## 2012-09-15 DIAGNOSIS — J069 Acute upper respiratory infection, unspecified: Secondary | ICD-10-CM | POA: Diagnosis not present

## 2012-09-15 DIAGNOSIS — R5383 Other fatigue: Secondary | ICD-10-CM | POA: Diagnosis not present

## 2012-10-02 ENCOUNTER — Encounter (HOSPITAL_COMMUNITY): Payer: Self-pay | Admitting: Emergency Medicine

## 2012-10-02 ENCOUNTER — Emergency Department (HOSPITAL_COMMUNITY)
Admission: EM | Admit: 2012-10-02 | Discharge: 2012-10-02 | Disposition: A | Discharge status: Home / Self Care | Payer: Medicare Other | Attending: Emergency Medicine | Admitting: Emergency Medicine

## 2012-10-02 DIAGNOSIS — I714 Abdominal aortic aneurysm, without rupture, unspecified: Secondary | ICD-10-CM | POA: Insufficient documentation

## 2012-10-02 DIAGNOSIS — Z79899 Other long term (current) drug therapy: Secondary | ICD-10-CM | POA: Insufficient documentation

## 2012-10-02 DIAGNOSIS — K5641 Fecal impaction: Secondary | ICD-10-CM | POA: Diagnosis not present

## 2012-10-02 DIAGNOSIS — I10 Essential (primary) hypertension: Secondary | ICD-10-CM | POA: Insufficient documentation

## 2012-10-02 DIAGNOSIS — Z7901 Long term (current) use of anticoagulants: Secondary | ICD-10-CM | POA: Insufficient documentation

## 2012-10-02 DIAGNOSIS — Z7982 Long term (current) use of aspirin: Secondary | ICD-10-CM | POA: Diagnosis not present

## 2012-10-02 DIAGNOSIS — R339 Retention of urine, unspecified: Secondary | ICD-10-CM | POA: Insufficient documentation

## 2012-10-02 DIAGNOSIS — E785 Hyperlipidemia, unspecified: Secondary | ICD-10-CM | POA: Insufficient documentation

## 2012-10-02 DIAGNOSIS — I2510 Atherosclerotic heart disease of native coronary artery without angina pectoris: Secondary | ICD-10-CM | POA: Insufficient documentation

## 2012-10-02 DIAGNOSIS — Z87891 Personal history of nicotine dependence: Secondary | ICD-10-CM | POA: Insufficient documentation

## 2012-10-02 DIAGNOSIS — E119 Type 2 diabetes mellitus without complications: Secondary | ICD-10-CM | POA: Insufficient documentation

## 2012-10-02 DIAGNOSIS — K56609 Unspecified intestinal obstruction, unspecified as to partial versus complete obstruction: Secondary | ICD-10-CM | POA: Diagnosis not present

## 2012-10-02 DIAGNOSIS — E079 Disorder of thyroid, unspecified: Secondary | ICD-10-CM | POA: Insufficient documentation

## 2012-10-02 DIAGNOSIS — K5900 Constipation, unspecified: Secondary | ICD-10-CM | POA: Insufficient documentation

## 2012-10-02 DIAGNOSIS — K5649 Other impaction of intestine: Secondary | ICD-10-CM | POA: Diagnosis not present

## 2012-10-02 HISTORY — DX: Fecal impaction: K56.41

## 2012-10-02 LAB — URINALYSIS, MICROSCOPIC ONLY
Bilirubin Urine: NEGATIVE
Glucose, UA: NEGATIVE mg/dL
Ketones, ur: NEGATIVE mg/dL
Leukocytes, UA: NEGATIVE
Nitrite: NEGATIVE
Protein, ur: 100 mg/dL — AB
Specific Gravity, Urine: 1.017 (ref 1.005–1.030)
Urobilinogen, UA: 0.2 mg/dL (ref 0.0–1.0)
pH: 6.5 (ref 5.0–8.0)

## 2012-10-02 MED ORDER — MINERAL OIL RE ENEM: 1.0000 | ENEMA | Freq: Once | RECTAL | Status: DC

## 2012-10-02 MED ORDER — POLYETHYLENE GLYCOL 3350 17 G PO PACK: 17.0000 g | PACK | Freq: Two times a day (BID) | ORAL | Status: DC

## 2012-10-02 NOTE — ED Notes (Signed)
Pt sts constipation and nausea x 2 days.  Sts he took Miralax and Mag Citrate yesterday with no relief.  Pt tried 2 enemas today with no relief.  Pt sts he had the same problem last November.

## 2012-10-02 NOTE — ED Notes (Signed)
Bed:WA16<BR> Expected date:<BR> Expected time:<BR> Means of arrival:<BR> Comments:<BR> Res A

## 2012-10-02 NOTE — ED Provider Notes (Signed)
History    71yM with urinary retention. Only able to pass a small amount of urine this am. Increasing lower abdominal discomfort. Feels impacted. Hx of urinary retention because of this previously. Has tried miralax and fleets enemas at home without relief. Last BM 2d ago.  Mild nausea. No vomiting. No hematuria. No fever or chills.  CSN: [id number]  Arrival date & time 10/02/12  1334   First MD Initiated Contact with Patient 10/02/12 1459      Chief Complaint  Patient presents with  . Abdominal Pain    ?SBO/hx of    (Consider location/radiation/quality/duration/timing/severity/associated sxs/prior treatment) HPI  Past Medical History  Diagnosis Date  . Coronary artery disease   . Constipation   . Hypertension   . Diabetes mellitus   . Thyroid disease   . Facial nerve spasm     L eye, tx'd with botox  . Hyperlipidemia   . AAA (abdominal aortic aneurysm)   . Fecal impaction     Past Surgical History  Procedure Date  . Coronary artery bypass graft 07-2009  . Spine surgery 1998  . Abdominal aortic aneurysm repair 01-2010    aorto-bi-iliac Gore Excluder stent graft    History reviewed. No pertinent family history.  History  Substance Use Topics  . Smoking status: Former Smoker -- 15 years    Types: Cigarettes    Quit date: 10/07/2008  . Smokeless tobacco: Never Used  . Alcohol Use: 0.6 oz/week    1 Glasses of wine per week      Review of Systems   Review of symptoms negative unless otherwise noted in HPI.   Allergies  Penicillins and Tetanus toxoids  Home Medications   Current Outpatient Rx  Name  Route  Sig  Dispense  Refill  . AMLODIPINE BESYLATE 10 MG PO TABS   Oral   Take 10 mg by mouth daily.           . ASPIRIN 81 MG PO TABS   Oral   Take 81 mg by mouth daily.           Marland Kitchen VITAMIN D-3 1000 UNITS PO CAPS   Oral   Take 1,000 Units by mouth daily.         Marland Kitchen CINNAMON 500 MG PO CAPS   Oral   Take 500 mg by mouth 2 (two) times daily.           Marland Kitchen DIGOXIN 0.125 MG PO TABS   Oral   Take 125 mcg by mouth daily.           . OMEGA-3 FATTY ACIDS 1000 MG PO CAPS   Oral   Take 2 g by mouth daily.         Marland Kitchen FA-PYRIDOXINE-CYANCOBALAMIN 2.5-25-2 MG PO TABS   Oral   Take 1 tablet by mouth at bedtime.           Marland Kitchen GRAPE SEED EXTRACT PO   Oral   Take 500 mg by mouth daily. OTC         . LEVOTHYROXINE SODIUM 125 MCG PO TABS   Oral   Take 125 mcg by mouth daily.           Marland Kitchen LISINOPRIL 40 MG PO TABS   Oral   Take 40 mg by mouth daily.           Marland Kitchen METOPROLOL TARTRATE PO   Oral   Take 25 mg by mouth 2 (two) times daily.           Marland Kitchen  ICAPS PO CAPS   Oral   Take 1 capsule by mouth at bedtime. Actually taking a different equivalent that only gets in eye doctors office. Per wife         . POLYETHYLENE GLYCOL 3350 PO PACK   Oral   Take 17 g by mouth 2 (two) times daily.         Marland Kitchen ROSUVASTATIN CALCIUM 5 MG PO TABS   Oral   Take 5 mg by mouth daily.         . WARFARIN SODIUM 5 MG PO TABS   Oral   Take 5-7.5 mg by mouth daily. Mon, Wed, Fri, Fri, Sat,Sun   Takes 5mg  and on Tue, Thurs takes 7.5mg            BP 156/96  Pulse 88  Temp 98.1 F (36.7 C) (Oral)  Resp 18  Ht 5\' 7"  (1.702 m)  Wt 195 lb (88.451 kg)  BMI 30.54 kg/m2  SpO2 95%  Physical Exam  Nursing note and vitals reviewed. Constitutional: He appears well-developed and well-nourished. No distress.  HENT:  Head: Normocephalic and atraumatic.  Eyes: Conjunctivae normal are normal. Right eye exhibits no discharge. Left eye exhibits no discharge.  Neck: Neck supple.  Cardiovascular: Normal rate, regular rhythm and normal heart sounds.  Exam reveals no gallop and no friction rub.   No murmur heard. Pulmonary/Chest: Effort normal and breath sounds normal. No respiratory distress.  Abdominal: Soft. He exhibits mass. He exhibits no distension. There is tenderness.       Small reducible umbilical hernia. Suprapubic tenderness w/o rebound or  guarding. bladder distended.  Genitourinary:       DRE: skin tag. Normal tone. Large amount of impacted stool in rectum. Manson Passey. No gross blood.   Musculoskeletal: He exhibits no edema and no tenderness.  Neurological: He is alert.  Skin: Skin is warm and dry.  Psychiatric: He has a normal mood and affect. His behavior is normal. Thought content normal.    ED Course  Fecal disimpaction Date/Time: 10/02/2012 6:27 PM Performed by: Raeford Razor Authorized by: Raeford Razor Consent: Verbal consent obtained. Consent given by: patient Patient identity confirmed: verbally with patient, arm band and provided demographic data Local anesthesia used: no Patient sedated: no Patient tolerance: Patient tolerated the procedure well with no immediate complications. Comments: Digital disimpaction   (including critical care time)  Labs Reviewed - No data to display No results found.   1. Urinary retention   2. Impacted stool in rectum       MDM  71yM with stool impaction and urinary retention likely 2/2 to it. Manually disimpacted what I could. Foley placed. Continue miralax and enemas. Suspect retention to resolve once able to move bowels sufficiently. Outpt urology FU.         Raeford Razor, MD 10/02/12 [id number]

## 2012-10-02 NOTE — ED Notes (Addendum)
Patient has been constipated since yesterday and has a h/o impactions.  Patient has lower abdominal pain and nausea and denies fevers.  Patient is also having dysuria with decreased output.  Patient has attempted laxatives and enemas with no improvement.

## 2012-10-07 DIAGNOSIS — R339 Retention of urine, unspecified: Secondary | ICD-10-CM | POA: Diagnosis not present

## 2012-10-07 DIAGNOSIS — R82998 Other abnormal findings in urine: Secondary | ICD-10-CM | POA: Diagnosis not present

## 2012-10-09 DIAGNOSIS — Z7901 Long term (current) use of anticoagulants: Secondary | ICD-10-CM | POA: Diagnosis not present

## 2012-10-09 DIAGNOSIS — E7800 Pure hypercholesterolemia, unspecified: Secondary | ICD-10-CM | POA: Diagnosis not present

## 2012-10-09 DIAGNOSIS — Z79899 Other long term (current) drug therapy: Secondary | ICD-10-CM | POA: Diagnosis not present

## 2012-10-09 DIAGNOSIS — I4891 Unspecified atrial fibrillation: Secondary | ICD-10-CM | POA: Diagnosis not present

## 2012-10-09 DIAGNOSIS — R339 Retention of urine, unspecified: Secondary | ICD-10-CM | POA: Diagnosis not present

## 2012-10-09 DIAGNOSIS — I10 Essential (primary) hypertension: Secondary | ICD-10-CM | POA: Diagnosis not present

## 2012-10-09 DIAGNOSIS — I2510 Atherosclerotic heart disease of native coronary artery without angina pectoris: Secondary | ICD-10-CM | POA: Diagnosis not present

## 2012-10-14 DIAGNOSIS — E1129 Type 2 diabetes mellitus with other diabetic kidney complication: Secondary | ICD-10-CM | POA: Diagnosis not present

## 2012-10-14 DIAGNOSIS — I2510 Atherosclerotic heart disease of native coronary artery without angina pectoris: Secondary | ICD-10-CM | POA: Diagnosis not present

## 2012-10-27 DIAGNOSIS — N400 Enlarged prostate without lower urinary tract symptoms: Secondary | ICD-10-CM | POA: Diagnosis not present

## 2012-10-27 DIAGNOSIS — R339 Retention of urine, unspecified: Secondary | ICD-10-CM | POA: Diagnosis not present

## 2012-10-29 DIAGNOSIS — H538 Other visual disturbances: Secondary | ICD-10-CM | POA: Diagnosis not present

## 2012-10-29 DIAGNOSIS — H40019 Open angle with borderline findings, low risk, unspecified eye: Secondary | ICD-10-CM | POA: Diagnosis not present

## 2012-10-29 DIAGNOSIS — H04129 Dry eye syndrome of unspecified lacrimal gland: Secondary | ICD-10-CM | POA: Diagnosis not present

## 2012-10-30 DIAGNOSIS — G518 Other disorders of facial nerve: Secondary | ICD-10-CM | POA: Diagnosis not present

## 2012-10-30 DIAGNOSIS — R413 Other amnesia: Secondary | ICD-10-CM | POA: Diagnosis not present

## 2012-10-30 DIAGNOSIS — L218 Other seborrheic dermatitis: Secondary | ICD-10-CM | POA: Diagnosis not present

## 2012-10-30 DIAGNOSIS — L570 Actinic keratosis: Secondary | ICD-10-CM | POA: Diagnosis not present

## 2012-10-31 DIAGNOSIS — K5900 Constipation, unspecified: Secondary | ICD-10-CM | POA: Diagnosis not present

## 2012-10-31 DIAGNOSIS — J329 Chronic sinusitis, unspecified: Secondary | ICD-10-CM | POA: Diagnosis not present

## 2012-11-05 ENCOUNTER — Other Ambulatory Visit: Payer: Self-pay | Admitting: Neurology

## 2012-11-05 DIAGNOSIS — R4189 Other symptoms and signs involving cognitive functions and awareness: Secondary | ICD-10-CM

## 2012-11-13 ENCOUNTER — Ambulatory Visit
Admission: RE | Admit: 2012-11-13 | Discharge: 2012-11-13 | Disposition: A | Discharge status: Home / Self Care | Payer: Medicare Other | Source: Ambulatory Visit | Attending: Neurology | Admitting: Neurology

## 2012-11-13 DIAGNOSIS — R4182 Altered mental status, unspecified: Secondary | ICD-10-CM | POA: Diagnosis not present

## 2012-11-13 DIAGNOSIS — R4189 Other symptoms and signs involving cognitive functions and awareness: Secondary | ICD-10-CM | POA: Diagnosis not present

## 2012-11-17 DIAGNOSIS — N390 Urinary tract infection, site not specified: Secondary | ICD-10-CM | POA: Diagnosis not present

## 2012-11-17 DIAGNOSIS — R339 Retention of urine, unspecified: Secondary | ICD-10-CM | POA: Diagnosis not present

## 2012-11-20 DIAGNOSIS — Z7901 Long term (current) use of anticoagulants: Secondary | ICD-10-CM | POA: Diagnosis not present

## 2012-11-20 DIAGNOSIS — I4891 Unspecified atrial fibrillation: Secondary | ICD-10-CM | POA: Diagnosis not present

## 2012-12-01 DIAGNOSIS — H16149 Punctate keratitis, unspecified eye: Secondary | ICD-10-CM | POA: Diagnosis not present

## 2012-12-01 DIAGNOSIS — K5901 Slow transit constipation: Secondary | ICD-10-CM | POA: Diagnosis not present

## 2012-12-01 DIAGNOSIS — R0982 Postnasal drip: Secondary | ICD-10-CM | POA: Diagnosis not present

## 2012-12-04 DIAGNOSIS — Z7901 Long term (current) use of anticoagulants: Secondary | ICD-10-CM | POA: Diagnosis not present

## 2012-12-04 DIAGNOSIS — I4891 Unspecified atrial fibrillation: Secondary | ICD-10-CM | POA: Diagnosis not present

## 2012-12-17 DIAGNOSIS — H01009 Unspecified blepharitis unspecified eye, unspecified eyelid: Secondary | ICD-10-CM | POA: Diagnosis not present

## 2012-12-17 DIAGNOSIS — H40019 Open angle with borderline findings, low risk, unspecified eye: Secondary | ICD-10-CM | POA: Diagnosis not present

## 2012-12-17 DIAGNOSIS — H353190 Nonexudative age-related macular degeneration, unspecified eye, stage unspecified: Secondary | ICD-10-CM | POA: Diagnosis not present

## 2012-12-17 DIAGNOSIS — H43399 Other vitreous opacities, unspecified eye: Secondary | ICD-10-CM | POA: Diagnosis not present

## 2012-12-17 DIAGNOSIS — Z961 Presence of intraocular lens: Secondary | ICD-10-CM | POA: Diagnosis not present

## 2012-12-18 DIAGNOSIS — I4891 Unspecified atrial fibrillation: Secondary | ICD-10-CM | POA: Diagnosis not present

## 2012-12-18 DIAGNOSIS — Z7901 Long term (current) use of anticoagulants: Secondary | ICD-10-CM | POA: Diagnosis not present

## 2013-01-05 DIAGNOSIS — R413 Other amnesia: Secondary | ICD-10-CM | POA: Diagnosis not present

## 2013-01-05 DIAGNOSIS — G518 Other disorders of facial nerve: Secondary | ICD-10-CM | POA: Diagnosis not present

## 2013-01-12 DIAGNOSIS — R220 Localized swelling, mass and lump, head: Secondary | ICD-10-CM | POA: Diagnosis not present

## 2013-01-12 DIAGNOSIS — R221 Localized swelling, mass and lump, neck: Secondary | ICD-10-CM | POA: Diagnosis not present

## 2013-01-15 DIAGNOSIS — Z7901 Long term (current) use of anticoagulants: Secondary | ICD-10-CM | POA: Diagnosis not present

## 2013-01-15 DIAGNOSIS — I4891 Unspecified atrial fibrillation: Secondary | ICD-10-CM | POA: Diagnosis not present

## 2013-02-02 DIAGNOSIS — R413 Other amnesia: Secondary | ICD-10-CM | POA: Diagnosis not present

## 2013-02-02 DIAGNOSIS — G518 Other disorders of facial nerve: Secondary | ICD-10-CM | POA: Diagnosis not present

## 2013-02-12 DIAGNOSIS — Z7901 Long term (current) use of anticoagulants: Secondary | ICD-10-CM | POA: Diagnosis not present

## 2013-02-12 DIAGNOSIS — I4891 Unspecified atrial fibrillation: Secondary | ICD-10-CM | POA: Diagnosis not present

## 2013-02-24 DIAGNOSIS — Z0000 Encounter for general adult medical examination without abnormal findings: Secondary | ICD-10-CM | POA: Diagnosis not present

## 2013-02-24 DIAGNOSIS — R197 Diarrhea, unspecified: Secondary | ICD-10-CM | POA: Diagnosis not present

## 2013-02-24 DIAGNOSIS — E039 Hypothyroidism, unspecified: Secondary | ICD-10-CM | POA: Diagnosis not present

## 2013-02-24 DIAGNOSIS — E669 Obesity, unspecified: Secondary | ICD-10-CM | POA: Diagnosis not present

## 2013-02-24 DIAGNOSIS — Z79899 Other long term (current) drug therapy: Secondary | ICD-10-CM | POA: Diagnosis not present

## 2013-02-24 DIAGNOSIS — E1129 Type 2 diabetes mellitus with other diabetic kidney complication: Secondary | ICD-10-CM | POA: Diagnosis not present

## 2013-02-25 DIAGNOSIS — R220 Localized swelling, mass and lump, head: Secondary | ICD-10-CM | POA: Diagnosis not present

## 2013-02-25 DIAGNOSIS — R221 Localized swelling, mass and lump, neck: Secondary | ICD-10-CM | POA: Diagnosis not present

## 2013-02-27 ENCOUNTER — Other Ambulatory Visit: Payer: Self-pay | Admitting: Otolaryngology

## 2013-02-27 DIAGNOSIS — R221 Localized swelling, mass and lump, neck: Secondary | ICD-10-CM

## 2013-03-02 ENCOUNTER — Ambulatory Visit
Admission: RE | Admit: 2013-03-02 | Discharge: 2013-03-02 | Disposition: A | Discharge status: Home / Self Care | Payer: Medicare Other | Source: Ambulatory Visit | Attending: Otolaryngology | Admitting: Otolaryngology

## 2013-03-02 DIAGNOSIS — K118 Other diseases of salivary glands: Secondary | ICD-10-CM | POA: Diagnosis not present

## 2013-03-02 DIAGNOSIS — R221 Localized swelling, mass and lump, neck: Secondary | ICD-10-CM

## 2013-03-02 MED ORDER — IOHEXOL 300 MG/ML  SOLN
75.0000 mL | Freq: Once | INTRAMUSCULAR | Status: AC | PRN
  Administered 2013-03-02: 75 mL via INTRAVENOUS

## 2013-03-09 DIAGNOSIS — G518 Other disorders of facial nerve: Secondary | ICD-10-CM | POA: Diagnosis not present

## 2013-03-11 DIAGNOSIS — I2510 Atherosclerotic heart disease of native coronary artery without angina pectoris: Secondary | ICD-10-CM | POA: Diagnosis not present

## 2013-03-11 DIAGNOSIS — Z7901 Long term (current) use of anticoagulants: Secondary | ICD-10-CM | POA: Diagnosis not present

## 2013-03-11 DIAGNOSIS — I4891 Unspecified atrial fibrillation: Secondary | ICD-10-CM | POA: Diagnosis not present

## 2013-03-11 DIAGNOSIS — Z79899 Other long term (current) drug therapy: Secondary | ICD-10-CM | POA: Diagnosis not present

## 2013-03-16 DIAGNOSIS — D119 Benign neoplasm of major salivary gland, unspecified: Secondary | ICD-10-CM | POA: Diagnosis not present

## 2013-03-17 ENCOUNTER — Other Ambulatory Visit: Payer: Self-pay | Admitting: Otolaryngology

## 2013-03-17 DIAGNOSIS — K118 Other diseases of salivary glands: Secondary | ICD-10-CM

## 2013-03-19 ENCOUNTER — Other Ambulatory Visit: Payer: Self-pay | Admitting: Radiology

## 2013-03-20 ENCOUNTER — Encounter (HOSPITAL_COMMUNITY): Payer: Self-pay | Admitting: Pharmacy Technician

## 2013-03-24 ENCOUNTER — Ambulatory Visit
Admission: RE | Admit: 2013-03-24 | Discharge: 2013-03-24 | Disposition: A | Discharge status: Home / Self Care | Payer: Medicare Other | Source: Ambulatory Visit | Attending: Gastroenterology | Admitting: Gastroenterology

## 2013-03-24 ENCOUNTER — Other Ambulatory Visit: Payer: Self-pay | Admitting: Gastroenterology

## 2013-03-24 DIAGNOSIS — R109 Unspecified abdominal pain: Secondary | ICD-10-CM | POA: Diagnosis not present

## 2013-03-24 DIAGNOSIS — R1032 Left lower quadrant pain: Secondary | ICD-10-CM

## 2013-03-24 DIAGNOSIS — R197 Diarrhea, unspecified: Secondary | ICD-10-CM | POA: Diagnosis not present

## 2013-03-25 ENCOUNTER — Other Ambulatory Visit: Payer: Self-pay | Admitting: Radiology

## 2013-03-25 DIAGNOSIS — R197 Diarrhea, unspecified: Secondary | ICD-10-CM | POA: Diagnosis not present

## 2013-03-26 ENCOUNTER — Ambulatory Visit (HOSPITAL_COMMUNITY)
Admission: RE | Admit: 2013-03-26 | Discharge: 2013-03-26 | Disposition: A | Discharge status: Home / Self Care | Payer: Medicare Other | Source: Ambulatory Visit | Attending: Otolaryngology | Admitting: Otolaryngology

## 2013-03-26 DIAGNOSIS — R220 Localized swelling, mass and lump, head: Secondary | ICD-10-CM | POA: Diagnosis not present

## 2013-03-26 DIAGNOSIS — Z01812 Encounter for preprocedural laboratory examination: Secondary | ICD-10-CM | POA: Insufficient documentation

## 2013-03-26 DIAGNOSIS — E785 Hyperlipidemia, unspecified: Secondary | ICD-10-CM | POA: Insufficient documentation

## 2013-03-26 DIAGNOSIS — I2510 Atherosclerotic heart disease of native coronary artery without angina pectoris: Secondary | ICD-10-CM | POA: Insufficient documentation

## 2013-03-26 DIAGNOSIS — Z951 Presence of aortocoronary bypass graft: Secondary | ICD-10-CM | POA: Diagnosis not present

## 2013-03-26 DIAGNOSIS — E079 Disorder of thyroid, unspecified: Secondary | ICD-10-CM | POA: Diagnosis not present

## 2013-03-26 DIAGNOSIS — K116 Mucocele of salivary gland: Secondary | ICD-10-CM | POA: Diagnosis not present

## 2013-03-26 DIAGNOSIS — K118 Other diseases of salivary glands: Secondary | ICD-10-CM

## 2013-03-26 DIAGNOSIS — E119 Type 2 diabetes mellitus without complications: Secondary | ICD-10-CM | POA: Insufficient documentation

## 2013-03-26 DIAGNOSIS — I10 Essential (primary) hypertension: Secondary | ICD-10-CM | POA: Diagnosis not present

## 2013-03-26 DIAGNOSIS — K119 Disease of salivary gland, unspecified: Secondary | ICD-10-CM | POA: Diagnosis not present

## 2013-03-26 LAB — CBC
MCH: 29.5 pg (ref 26.0–34.0)
MCV: 84.1 fL (ref 78.0–100.0)
Platelets: 136 10*3/uL — ABNORMAL LOW (ref 150–400)
RDW: 14.6 % (ref 11.5–15.5)
WBC: 7.5 10*3/uL (ref 4.0–10.5)

## 2013-03-26 MED ORDER — SODIUM CHLORIDE 0.9 % IV SOLN
Freq: Once | INTRAVENOUS | Status: AC
  Administered 2013-03-26: 1000 mL via INTRAVENOUS

## 2013-03-26 MED ORDER — MIDAZOLAM HCL 2 MG/2ML IJ SOLN
INTRAMUSCULAR | Status: AC
  Filled 2013-03-26: qty 4

## 2013-03-26 MED ORDER — MIDAZOLAM HCL 2 MG/2ML IJ SOLN
INTRAMUSCULAR | Status: AC | PRN
  Administered 2013-03-26: 1 mg via INTRAVENOUS

## 2013-03-26 MED ORDER — FENTANYL CITRATE 0.05 MG/ML IJ SOLN
INTRAMUSCULAR | Status: AC
  Filled 2013-03-26: qty 4

## 2013-03-26 MED ORDER — FENTANYL CITRATE 0.05 MG/ML IJ SOLN
INTRAMUSCULAR | Status: AC | PRN
  Administered 2013-03-26: 50 ug via INTRAVENOUS

## 2013-03-26 NOTE — Addendum Note (Signed)
Encounter addended by: Burnett Kanaris, RN on: 03/26/2013 12:01 PM<BR>     Documentation filed: Notes Section, Inpatient Document Flowsheet

## 2013-03-26 NOTE — ED Notes (Signed)
Labs discussed with Dr Grace Isaac

## 2013-03-26 NOTE — Addendum Note (Signed)
Encounter addended by: Eddie Candle, RPH on: 03/26/2013 10:48 PM<BR>     Documentation filed: Rx Order Verification

## 2013-03-26 NOTE — ED Notes (Signed)
2.5 cc pink tinged fluid aspirated.

## 2013-03-26 NOTE — ED Notes (Signed)
MD at bedside. Explaining procedure and answering questions.

## 2013-03-26 NOTE — Procedures (Signed)
Technically successful US guided biopsy/aspriation of left sided parotid gland cystic lesion.  No immediate complications.

## 2013-03-26 NOTE — ED Notes (Signed)
MD at bedside.  Discussing improvement of facial droop.  Wife at bedside.  VSS. Tolerated procedure well.

## 2013-03-26 NOTE — ED Notes (Signed)
Pt noticing facial droop is improved already.facial spasm improved as well.

## 2013-03-26 NOTE — Progress Notes (Signed)
Discharged home with spouse, via wheelchair with belongings, denies pain

## 2013-03-26 NOTE — ED Notes (Signed)
Occasional bigeminy.  Dr Grace Isaac informed.  Occasional PVC prior to case.

## 2013-03-26 NOTE — H&P (Signed)
Troy [name] is an 73 y.o. male.   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 Coronary artery disease   . Constipation   . Hypertension   . Diabetes mellitus   . Thyroid disease   . Facial nerve spasm     L eye, tx'd with botox  . Hyperlipidemia   . AAA (abdominal aortic aneurysm)   . Fecal impaction     Past Surgical History  Procedure Laterality Date  . Coronary artery bypass graft  07-2009  . Spine surgery  1998  . Abdominal aortic aneurysm repair  01-2010    aorto-bi-iliac Gore Excluder stent graft    No family history on file. Social History:  reports that he quit smoking about 4 years ago. His smoking use included Cigarettes. He smoked 0.00 packs per day for 15 years. He has never used smokeless tobacco. He reports that he drinks about 0.6 ounces of alcohol per week. He reports that he does not use illicit drugs.  Allergies:  Allergies  Allergen Reactions  . Penicillins Rash  . Tetanus Toxoids Rash     (Not in a hospital admission)  Results for orders placed during the hospital encounter of 03/26/13 (from the past 48 hour(s))  APTT     Status: None   Collection Time    03/26/13  8:56 AM      Result Value Range   aPTT 29  24 - 37 seconds  CBC     Status: Abnormal   Collection Time    03/26/13  8:56 AM      Result Value Range   WBC 7.5  4.0 - 10.5 K/uL   RBC 4.85  4.22 - 5.81 MIL/uL   Hemoglobin 14.3  13.0 - 17.0 g/dL   HCT 40.9  81.1 - 91.4 %   MCV 84.1  78.0 - 100.0 fL   MCH 29.5  26.0 - 34.0 pg   MCHC 35.0  30.0 - 36.0 g/dL   RDW 78.2  95.6 - 21.3 %   Platelets 136 (*) 150 - 400  K/uL  PROTIME-INR     Status: None   Collection Time    03/26/13  8:56 AM      Result Value Range   Prothrombin Time 14.6  11.6 - 15.2 seconds   INR 1.16  0.00 - 1.49   Dg Abd 2 Views  03/24/2013  *RADIOLOGY REPORT*  Clinical Data: Abdominal pain.  ABDOMEN - 2 VIEW  Comparison: 03/31/2012 CT.  Findings: Post aortic iliac stenting.  Prior CABG.  Moderate stool throughout the colon.  No plain film evidence of bowel obstruction or free intraperitoneal air.  Tortuous descending thoracic aorta.  IMPRESSION: Moderate stool throughout the colon.  No plain film evidence of bowel obstruction or free intraperitoneal air.   Original Report Authenticated By: Lacy Duverney, M.D.     Review of Systems  Constitutional: Negative for fever.  Respiratory: Negative for shortness of breath.   Cardiovascular: Negative for chest pain.  Gastrointestinal: Negative for nausea, vomiting and abdominal pain.  Neurological: Negative for headaches.    Blood pressure 136/86,  pulse 54, temperature 98.3 F (36.8 C), temperature source Oral, resp. rate 18, height 5' 7.75" (1.721 m), weight 188 lb (85.276 kg), SpO2 97.00%. Physical Exam  Constitutional: He is oriented to person, place, and time. He appears well-nourished.  Eyes:  Left eye spasm Hemi facial spasm Post Botox inj 03/09/13  Cardiovascular: Normal rate, regular rhythm and normal heart sounds.   Respiratory: Effort normal and breath sounds normal. He has no wheezes.  GI: Soft. Bowel sounds are normal. There is no tenderness.  Musculoskeletal: Normal range of motion.  Neurological: He is alert and oriented to person, place, and time.  Skin: Skin is warm and dry.  Psychiatric: He has a normal mood and affect. His behavior is normal. Judgment and thought content normal.     Assessment/Plan Left parotid mass Known since 2013 Dr Jearld Fenton has now assumed care and requests bx Scheduled for biopsy today Pt aware of procedure benefits and risks and agreeable to  proceed Consent signed and in chart  Annalycia Done A 03/26/2013, 9:45 AM

## 2013-03-26 NOTE — ED Notes (Signed)
o2 2L/[place] started

## 2013-03-26 NOTE — ED Notes (Signed)
Previous images being reviewed by MD. [place] images obtained.

## 2013-03-26 NOTE — Addendum Note (Signed)
Encounter addended by: Burnett Kanaris, RN on: 03/26/2013 11:33 AM<BR>     Documentation filed: Charges VN, Inpatient Document Flowsheet

## 2013-04-08 DIAGNOSIS — R220 Localized swelling, mass and lump, head: Secondary | ICD-10-CM | POA: Diagnosis not present

## 2013-04-08 DIAGNOSIS — D119 Benign neoplasm of major salivary gland, unspecified: Secondary | ICD-10-CM | POA: Diagnosis not present

## 2013-04-09 DIAGNOSIS — Z79899 Other long term (current) drug therapy: Secondary | ICD-10-CM | POA: Diagnosis not present

## 2013-04-09 DIAGNOSIS — Z7901 Long term (current) use of anticoagulants: Secondary | ICD-10-CM | POA: Diagnosis not present

## 2013-04-09 DIAGNOSIS — I2510 Atherosclerotic heart disease of native coronary artery without angina pectoris: Secondary | ICD-10-CM | POA: Diagnosis not present

## 2013-04-09 DIAGNOSIS — E7800 Pure hypercholesterolemia, unspecified: Secondary | ICD-10-CM | POA: Diagnosis not present

## 2013-04-09 DIAGNOSIS — I10 Essential (primary) hypertension: Secondary | ICD-10-CM | POA: Diagnosis not present

## 2013-04-09 DIAGNOSIS — E669 Obesity, unspecified: Secondary | ICD-10-CM | POA: Diagnosis not present

## 2013-04-09 DIAGNOSIS — G4733 Obstructive sleep apnea (adult) (pediatric): Secondary | ICD-10-CM | POA: Diagnosis not present

## 2013-04-09 DIAGNOSIS — I4891 Unspecified atrial fibrillation: Secondary | ICD-10-CM | POA: Diagnosis not present

## 2013-04-10 ENCOUNTER — Other Ambulatory Visit: Payer: Self-pay | Admitting: Vascular Surgery

## 2013-04-10 DIAGNOSIS — I714 Abdominal aortic aneurysm, without rupture: Secondary | ICD-10-CM | POA: Diagnosis not present

## 2013-04-10 LAB — BUN: BUN: 12 mg/dL (ref 6–23)

## 2013-04-10 LAB — CREATININE, SERUM: Creat: 0.82 mg/dL (ref 0.50–1.35)

## 2013-04-12 DIAGNOSIS — M109 Gout, unspecified: Secondary | ICD-10-CM | POA: Diagnosis not present

## 2013-04-13 ENCOUNTER — Encounter: Payer: Self-pay | Admitting: Vascular Surgery

## 2013-04-14 ENCOUNTER — Ambulatory Visit
Admission: RE | Admit: 2013-04-14 | Discharge: 2013-04-14 | Disposition: A | Discharge status: Home / Self Care | Payer: Medicare Other | Source: Ambulatory Visit | Attending: Vascular Surgery | Admitting: Vascular Surgery

## 2013-04-14 ENCOUNTER — Encounter: Payer: Self-pay | Admitting: Vascular Surgery

## 2013-04-14 ENCOUNTER — Ambulatory Visit (INDEPENDENT_AMBULATORY_CARE_PROVIDER_SITE_OTHER): Payer: Medicare Other | Admitting: Vascular Surgery

## 2013-04-14 VITALS — BP 117/80 | HR 61 | Resp 16 | Ht 68.0 in | Wt 195.0 lb

## 2013-04-14 DIAGNOSIS — I714 Abdominal aortic aneurysm, without rupture: Secondary | ICD-10-CM

## 2013-04-14 DIAGNOSIS — Z48812 Encounter for surgical aftercare following surgery on the circulatory system: Secondary | ICD-10-CM | POA: Diagnosis not present

## 2013-04-14 MED ORDER — IOHEXOL 350 MG/ML SOLN
100.0000 mL | Freq: Once | INTRAVENOUS | Status: AC | PRN
  Administered 2013-04-14: 100 mL via INTRAVENOUS

## 2013-04-14 NOTE — Progress Notes (Signed)
Subjective:     Patient ID: Troy [name], male   DOB: February 15, 1941, 72 y.o.   MRN: [id number]  HPI this 72 year old male returns for continued followup regarding his aortic stent graft placed in 2011 by me. He has done well since that time. He denies any abdominal or back symptoms. He does complain of weakness in both legs with walking.  Past Medical History  Diagnosis Date  . Coronary artery disease   . Constipation   . Hypertension   . Diabetes mellitus   . Thyroid disease   . Facial nerve spasm     L eye, tx'd with botox  . Hyperlipidemia   . AAA (abdominal aortic aneurysm)   . Fecal impaction     History  Substance Use Topics  . Smoking status: Former Smoker -- 15 years    Types: Cigarettes    Quit date: 10/07/2008  . Smokeless tobacco: Never Used  . Alcohol Use: 0.6 oz/week    1 Glasses of wine per week    Family History  Problem Relation Age of Onset  . Heart disease Mother     CHF  . Alzheimer's disease Mother   . Alzheimer's disease Father   . Alzheimer's disease Brother   . Alzheimer's disease Paternal Uncle     Allergies  Allergen Reactions  . Penicillins Rash  . Tetanus Toxoids Rash    Current outpatient prescriptions:amLODipine (NORVASC) 10 MG tablet, Take 10 mg by mouth daily.  , Disp: , Rfl: ;  aspirin 81 MG tablet, Take 81 mg by mouth daily.  , Disp: , Rfl: ;  Cholecalciferol (VITAMIN D-3) 1000 UNITS CAPS, Take 1,000 Units by mouth daily., Disp: , Rfl: ;  Cinnamon 500 MG capsule, Take 500 mg by mouth 2 (two) times daily., Disp: , Rfl: ;  clobetasol (TEMOVATE) 0.05 % external solution, , Disp: , Rfl:  DHA-EPA-Vitamin E 200-300-5 MG-MG-UNIT CAPS, Take 1 capsule by mouth 2 (two) times daily., Disp: , Rfl: ;  digoxin (LANOXIN) 0.125 MG tablet, Take 125 mcg by mouth daily.  , Disp: , Rfl: ;  donepezil (ARICEPT) 10 MG tablet, Take 10 mg by mouth at bedtime., Disp: , Rfl: ;  EPIPEN 2-PAK 0.3 MG/0.3ML SOAJ, , Disp: , Rfl: ;  fluticasone (FLONASE) 50 MCG/ACT nasal  spray, Place 2 sprays into the nose every evening., Disp: , Rfl:  folic acid-pyridoxine-cyancobalamin (FOLTX) 2.5-25-2 MG TABS, Take 1 tablet by mouth at bedtime. PRN Fish oil for dry eye.. 2 Tabs., Disp: , Rfl: ;  GRAPE SEED EXTRACT PO, Take 500 mg by mouth daily. OTC, Disp: , Rfl: ;  ketoconazole (NIZORAL) 2 % shampoo, as needed., Disp: , Rfl: ;  levothyroxine (SYNTHROID, LEVOTHROID) 125 MCG tablet, Take 125 mcg by mouth daily.  , Disp: , Rfl:  lisinopril (PRINIVIL,ZESTRIL) 40 MG tablet, Take 40 mg by mouth daily.  , Disp: , Rfl: ;  METOPROLOL TARTRATE PO, Take 25 mg by mouth 2 (two) times daily.  , Disp: , Rfl: ;  Probiotic Product (ALIGN) 4 MG CAPS, Take by mouth., Disp: , Rfl: ;  rosuvastatin (CRESTOR) 5 MG tablet, Take 5 mg by mouth daily., Disp: , Rfl: ;  silodosin (RAPAFLO) 8 MG CAPS capsule, Take 8 mg by mouth daily with breakfast., Disp: , Rfl:  warfarin (COUMADIN) 5 MG tablet, Take 5-7.5 mg by mouth daily. Mon, Wed, Fri, Fri, Sat,Sun   Takes 5mg  and on Tue, Thurs takes 7.5mg , Disp: , Rfl: ;  Multiple Vitamins-Minerals (ICAPS) CAPS, Take 1  capsule by mouth 2 (two) times daily. Actually taking a different equivalent that only gets in eye doctors office. Per wife, Disp: , Rfl:   BP 117/80  Pulse 61  Resp 16  Ht 5\' 8"  (1.727 m)  Wt 195 lb (88.451 kg)  BMI 29.66 kg/m2  SpO2 96%  Body mass index is 29.66 kg/(m^2).           Review of Systems denies chest pain but does complain of problems with ambulation with weakness in both legs over the last 18 months. Has had bilateral cataract surgery. Denies dyspnea on exertion, PND, orthopnea, hemoptysis.    Objective:   Physical Exam blood pressure 117/80 heart rate 61 respirations 16 Gen.-alert and oriented x3 in no apparent distress HEENT normal for age Lungs no rhonchi or wheezing Cardiovascular regular rhythm no murmurs carotid pulses 3+ palpable no bruits audible Abdomen soft nontender no palpable masses Musculoskeletal free of   major deformities Skin clear -no rashes Neurologic normal Lower extremities 3+ femoral and posterior tibial pulses palpable bilaterally with no edema  Today I ordered a CT angiogram which I reviewed the computer. 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 This appears unchanged. The measurement of the aneurysm sac is possibly a few millimeters larger. Previous measurements were around 5-5.1 cm and today 5.3 cm      Assessment:     3 years status post aortic stent graft abdominal aortic aneurysm with known type II endoleak-possible slight enlargement of the aneurysm sac    Plan:     Return in 6 months with repeat CT angiogram to further assess aneurysm sac size and type II endoleak  Discuss this with the patient and his wife and they fully understand this

## 2013-04-14 NOTE — Addendum Note (Signed)
Addended by: Sharee Pimple on: 04/14/2013 01:56 PM   Modules accepted: Orders

## 2013-04-15 ENCOUNTER — Ambulatory Visit: Payer: Medicare Other | Attending: Family Medicine

## 2013-04-15 DIAGNOSIS — R159 Full incontinence of feces: Secondary | ICD-10-CM | POA: Diagnosis not present

## 2013-04-15 DIAGNOSIS — R293 Abnormal posture: Secondary | ICD-10-CM | POA: Insufficient documentation

## 2013-04-15 DIAGNOSIS — IMO0001 Reserved for inherently not codable concepts without codable children: Secondary | ICD-10-CM | POA: Diagnosis not present

## 2013-04-15 DIAGNOSIS — R197 Diarrhea, unspecified: Secondary | ICD-10-CM | POA: Diagnosis not present

## 2013-04-15 DIAGNOSIS — R262 Difficulty in walking, not elsewhere classified: Secondary | ICD-10-CM | POA: Insufficient documentation

## 2013-04-15 DIAGNOSIS — M6281 Muscle weakness (generalized): Secondary | ICD-10-CM | POA: Insufficient documentation

## 2013-04-15 DIAGNOSIS — Z86010 Personal history of colonic polyps: Secondary | ICD-10-CM | POA: Diagnosis not present

## 2013-04-17 ENCOUNTER — Ambulatory Visit: Payer: Medicare Other | Admitting: Physical Therapy

## 2013-04-17 DIAGNOSIS — R262 Difficulty in walking, not elsewhere classified: Secondary | ICD-10-CM | POA: Diagnosis not present

## 2013-04-17 DIAGNOSIS — R293 Abnormal posture: Secondary | ICD-10-CM | POA: Diagnosis not present

## 2013-04-17 DIAGNOSIS — M6281 Muscle weakness (generalized): Secondary | ICD-10-CM | POA: Diagnosis not present

## 2013-04-17 DIAGNOSIS — IMO0001 Reserved for inherently not codable concepts without codable children: Secondary | ICD-10-CM | POA: Diagnosis not present

## 2013-04-22 ENCOUNTER — Ambulatory Visit: Payer: Medicare Other | Admitting: Physical Therapy

## 2013-04-22 DIAGNOSIS — R293 Abnormal posture: Secondary | ICD-10-CM | POA: Diagnosis not present

## 2013-04-22 DIAGNOSIS — R262 Difficulty in walking, not elsewhere classified: Secondary | ICD-10-CM | POA: Diagnosis not present

## 2013-04-22 DIAGNOSIS — IMO0001 Reserved for inherently not codable concepts without codable children: Secondary | ICD-10-CM | POA: Diagnosis not present

## 2013-04-22 DIAGNOSIS — M6281 Muscle weakness (generalized): Secondary | ICD-10-CM | POA: Diagnosis not present

## 2013-04-23 DIAGNOSIS — I4891 Unspecified atrial fibrillation: Secondary | ICD-10-CM | POA: Diagnosis not present

## 2013-04-23 DIAGNOSIS — Z7901 Long term (current) use of anticoagulants: Secondary | ICD-10-CM | POA: Diagnosis not present

## 2013-04-29 ENCOUNTER — Ambulatory Visit: Payer: Medicare Other | Attending: Family Medicine

## 2013-04-29 DIAGNOSIS — R293 Abnormal posture: Secondary | ICD-10-CM | POA: Diagnosis not present

## 2013-04-29 DIAGNOSIS — M6281 Muscle weakness (generalized): Secondary | ICD-10-CM | POA: Diagnosis not present

## 2013-04-29 DIAGNOSIS — IMO0001 Reserved for inherently not codable concepts without codable children: Secondary | ICD-10-CM | POA: Diagnosis not present

## 2013-04-29 DIAGNOSIS — R262 Difficulty in walking, not elsewhere classified: Secondary | ICD-10-CM | POA: Insufficient documentation

## 2013-05-01 ENCOUNTER — Ambulatory Visit: Payer: Medicare Other | Admitting: Physical Therapy

## 2013-05-05 ENCOUNTER — Other Ambulatory Visit: Payer: Self-pay | Admitting: Gastroenterology

## 2013-05-05 DIAGNOSIS — R159 Full incontinence of feces: Secondary | ICD-10-CM | POA: Diagnosis not present

## 2013-05-05 DIAGNOSIS — K5730 Diverticulosis of large intestine without perforation or abscess without bleeding: Secondary | ICD-10-CM | POA: Diagnosis not present

## 2013-05-05 DIAGNOSIS — Z86010 Personal history of colonic polyps: Secondary | ICD-10-CM | POA: Diagnosis not present

## 2013-05-05 DIAGNOSIS — D126 Benign neoplasm of colon, unspecified: Secondary | ICD-10-CM | POA: Diagnosis not present

## 2013-05-06 ENCOUNTER — Ambulatory Visit: Payer: Medicare Other

## 2013-05-08 ENCOUNTER — Ambulatory Visit: Payer: Medicare Other | Admitting: Physical Therapy

## 2013-05-20 ENCOUNTER — Ambulatory Visit: Payer: Medicare Other

## 2013-05-22 ENCOUNTER — Ambulatory Visit: Payer: Medicare Other | Admitting: Physical Therapy

## 2013-05-25 DIAGNOSIS — Z7901 Long term (current) use of anticoagulants: Secondary | ICD-10-CM | POA: Diagnosis not present

## 2013-05-25 DIAGNOSIS — I4891 Unspecified atrial fibrillation: Secondary | ICD-10-CM | POA: Diagnosis not present

## 2013-05-26 ENCOUNTER — Ambulatory Visit: Payer: Medicare Other | Attending: Family Medicine

## 2013-05-26 DIAGNOSIS — R293 Abnormal posture: Secondary | ICD-10-CM | POA: Insufficient documentation

## 2013-05-26 DIAGNOSIS — IMO0001 Reserved for inherently not codable concepts without codable children: Secondary | ICD-10-CM | POA: Insufficient documentation

## 2013-05-26 DIAGNOSIS — R262 Difficulty in walking, not elsewhere classified: Secondary | ICD-10-CM | POA: Diagnosis not present

## 2013-05-26 DIAGNOSIS — M6281 Muscle weakness (generalized): Secondary | ICD-10-CM | POA: Insufficient documentation

## 2013-05-28 ENCOUNTER — Ambulatory Visit: Payer: Medicare Other

## 2013-06-02 ENCOUNTER — Ambulatory Visit: Payer: Medicare Other

## 2013-06-02 DIAGNOSIS — R159 Full incontinence of feces: Secondary | ICD-10-CM | POA: Diagnosis not present

## 2013-06-04 ENCOUNTER — Ambulatory Visit: Payer: Medicare Other

## 2013-06-22 DIAGNOSIS — I4891 Unspecified atrial fibrillation: Secondary | ICD-10-CM | POA: Diagnosis not present

## 2013-06-22 DIAGNOSIS — Z7901 Long term (current) use of anticoagulants: Secondary | ICD-10-CM | POA: Diagnosis not present

## 2013-07-13 DIAGNOSIS — N400 Enlarged prostate without lower urinary tract symptoms: Secondary | ICD-10-CM | POA: Diagnosis not present

## 2013-07-13 DIAGNOSIS — N529 Male erectile dysfunction, unspecified: Secondary | ICD-10-CM | POA: Diagnosis not present

## 2013-07-17 ENCOUNTER — Other Ambulatory Visit: Payer: Self-pay | Admitting: Otolaryngology

## 2013-07-17 DIAGNOSIS — R220 Localized swelling, mass and lump, head: Secondary | ICD-10-CM

## 2013-07-20 DIAGNOSIS — R269 Unspecified abnormalities of gait and mobility: Secondary | ICD-10-CM | POA: Diagnosis not present

## 2013-07-20 DIAGNOSIS — G518 Other disorders of facial nerve: Secondary | ICD-10-CM | POA: Diagnosis not present

## 2013-07-20 DIAGNOSIS — G4700 Insomnia, unspecified: Secondary | ICD-10-CM | POA: Diagnosis not present

## 2013-07-20 DIAGNOSIS — I10 Essential (primary) hypertension: Secondary | ICD-10-CM | POA: Diagnosis not present

## 2013-07-20 DIAGNOSIS — I4891 Unspecified atrial fibrillation: Secondary | ICD-10-CM | POA: Diagnosis not present

## 2013-07-20 DIAGNOSIS — Z7901 Long term (current) use of anticoagulants: Secondary | ICD-10-CM | POA: Diagnosis not present

## 2013-07-29 ENCOUNTER — Ambulatory Visit
Admission: RE | Admit: 2013-07-29 | Discharge: 2013-07-29 | Disposition: A | Discharge status: Home / Self Care | Payer: Medicare Other | Source: Ambulatory Visit | Attending: Otolaryngology | Admitting: Otolaryngology

## 2013-07-29 DIAGNOSIS — E041 Nontoxic single thyroid nodule: Secondary | ICD-10-CM | POA: Diagnosis not present

## 2013-07-29 DIAGNOSIS — R220 Localized swelling, mass and lump, head: Secondary | ICD-10-CM

## 2013-08-05 ENCOUNTER — Other Ambulatory Visit: Payer: Self-pay | Admitting: Otolaryngology

## 2013-08-05 DIAGNOSIS — K118 Other diseases of salivary glands: Secondary | ICD-10-CM

## 2013-08-05 DIAGNOSIS — R220 Localized swelling, mass and lump, head: Secondary | ICD-10-CM

## 2013-08-05 DIAGNOSIS — D119 Benign neoplasm of major salivary gland, unspecified: Secondary | ICD-10-CM | POA: Diagnosis not present

## 2013-08-07 DIAGNOSIS — R269 Unspecified abnormalities of gait and mobility: Secondary | ICD-10-CM | POA: Diagnosis not present

## 2013-08-10 DIAGNOSIS — R413 Other amnesia: Secondary | ICD-10-CM | POA: Diagnosis not present

## 2013-08-10 DIAGNOSIS — G518 Other disorders of facial nerve: Secondary | ICD-10-CM | POA: Diagnosis not present

## 2013-08-19 ENCOUNTER — Ambulatory Visit
Admission: RE | Admit: 2013-08-19 | Discharge: 2013-08-19 | Disposition: A | Discharge status: Home / Self Care | Payer: Medicare Other | Source: Ambulatory Visit | Attending: Otolaryngology | Admitting: Otolaryngology

## 2013-08-19 DIAGNOSIS — R220 Localized swelling, mass and lump, head: Secondary | ICD-10-CM

## 2013-08-19 DIAGNOSIS — K118 Other diseases of salivary glands: Secondary | ICD-10-CM

## 2013-08-20 DIAGNOSIS — Z7901 Long term (current) use of anticoagulants: Secondary | ICD-10-CM | POA: Diagnosis not present

## 2013-08-20 DIAGNOSIS — I4891 Unspecified atrial fibrillation: Secondary | ICD-10-CM | POA: Diagnosis not present

## 2013-08-22 ENCOUNTER — Other Ambulatory Visit: Payer: Self-pay | Admitting: Cardiology

## 2013-08-22 DIAGNOSIS — E7800 Pure hypercholesterolemia, unspecified: Secondary | ICD-10-CM

## 2013-08-22 DIAGNOSIS — Z79899 Other long term (current) drug therapy: Secondary | ICD-10-CM

## 2013-08-26 ENCOUNTER — Other Ambulatory Visit (HOSPITAL_COMMUNITY): Payer: Self-pay | Admitting: Diagnostic Radiology

## 2013-08-27 DIAGNOSIS — Z6831 Body mass index (BMI) 31.0-31.9, adult: Secondary | ICD-10-CM | POA: Diagnosis not present

## 2013-08-27 DIAGNOSIS — Z23 Encounter for immunization: Secondary | ICD-10-CM | POA: Diagnosis not present

## 2013-08-27 DIAGNOSIS — E1129 Type 2 diabetes mellitus with other diabetic kidney complication: Secondary | ICD-10-CM | POA: Diagnosis not present

## 2013-08-27 DIAGNOSIS — N182 Chronic kidney disease, stage 2 (mild): Secondary | ICD-10-CM | POA: Diagnosis not present

## 2013-08-27 DIAGNOSIS — G479 Sleep disorder, unspecified: Secondary | ICD-10-CM | POA: Diagnosis not present

## 2013-08-27 DIAGNOSIS — E7800 Pure hypercholesterolemia, unspecified: Secondary | ICD-10-CM | POA: Diagnosis not present

## 2013-08-28 ENCOUNTER — Ambulatory Visit (INDEPENDENT_AMBULATORY_CARE_PROVIDER_SITE_OTHER): Payer: Medicare Other | Admitting: Pharmacist

## 2013-08-28 DIAGNOSIS — I4891 Unspecified atrial fibrillation: Secondary | ICD-10-CM

## 2013-08-28 DIAGNOSIS — I4819 Other persistent atrial fibrillation: Secondary | ICD-10-CM | POA: Insufficient documentation

## 2013-08-28 LAB — POCT INR: INR: 2.1

## 2013-09-02 DIAGNOSIS — N400 Enlarged prostate without lower urinary tract symptoms: Secondary | ICD-10-CM | POA: Diagnosis not present

## 2013-09-02 DIAGNOSIS — N529 Male erectile dysfunction, unspecified: Secondary | ICD-10-CM | POA: Diagnosis not present

## 2013-09-02 DIAGNOSIS — R339 Retention of urine, unspecified: Secondary | ICD-10-CM | POA: Diagnosis not present

## 2013-09-07 ENCOUNTER — Ambulatory Visit (INDEPENDENT_AMBULATORY_CARE_PROVIDER_SITE_OTHER): Payer: Medicare Other | Admitting: *Deleted

## 2013-09-07 ENCOUNTER — Other Ambulatory Visit: Payer: Medicare Other | Admitting: *Deleted

## 2013-09-07 DIAGNOSIS — Z79899 Other long term (current) drug therapy: Secondary | ICD-10-CM | POA: Diagnosis not present

## 2013-09-07 DIAGNOSIS — E1129 Type 2 diabetes mellitus with other diabetic kidney complication: Secondary | ICD-10-CM | POA: Diagnosis not present

## 2013-09-07 DIAGNOSIS — E039 Hypothyroidism, unspecified: Secondary | ICD-10-CM

## 2013-09-07 DIAGNOSIS — E7800 Pure hypercholesterolemia, unspecified: Secondary | ICD-10-CM

## 2013-09-07 LAB — BASIC METABOLIC PANEL
CO2: 30 mEq/L (ref 19–32)
Chloride: 103 mEq/L (ref 96–112)
Potassium: 3.7 mEq/L (ref 3.5–5.1)
Sodium: 140 mEq/L (ref 135–145)

## 2013-09-07 LAB — LIPID PANEL
Cholesterol: 112 mg/dL (ref 0–200)
LDL Cholesterol: 54 mg/dL (ref 0–99)
Triglycerides: 121 mg/dL (ref 0.0–149.0)

## 2013-09-07 LAB — HEMOGLOBIN A1C: Hgb A1c MFr Bld: 7.2 % — ABNORMAL HIGH (ref 4.6–6.5)

## 2013-09-07 LAB — TSH: TSH: 2.84 u[IU]/mL (ref 0.35–5.50)

## 2013-09-07 LAB — T4, FREE: Free T4: 1.11 ng/dL (ref 0.60–1.60)

## 2013-09-08 ENCOUNTER — Telehealth: Payer: Self-pay | Admitting: General Surgery

## 2013-09-08 ENCOUNTER — Other Ambulatory Visit: Payer: Self-pay | Admitting: General Surgery

## 2013-09-08 DIAGNOSIS — Z79899 Other long term (current) drug therapy: Secondary | ICD-10-CM

## 2013-09-08 DIAGNOSIS — E7800 Pure hypercholesterolemia, unspecified: Secondary | ICD-10-CM

## 2013-09-08 NOTE — Telephone Encounter (Signed)
Message copied by Nita Sells on Tue Sep 08, 2013  5:07 PM ------      Message from: Armanda Magic R      Created: Mon Sep 07, 2013  9:13 PM       Lipids are at goal, TSH normal and BMET normal except for elevated glucose and HbG A1C - forward to primary MD ------

## 2013-09-08 NOTE — Progress Notes (Signed)
Sent labs over to pts PCP.

## 2013-09-08 NOTE — Telephone Encounter (Signed)
Pt is aware. Lipids to be done again in a year.

## 2013-09-10 ENCOUNTER — Telehealth: Payer: Self-pay | Admitting: Cardiology

## 2013-09-10 NOTE — Telephone Encounter (Signed)
New problem    Pt's wife called in with a question about the lab results.   Please give her a call

## 2013-09-14 NOTE — Telephone Encounter (Signed)
Spoke with wife and answered question

## 2013-09-14 NOTE — Telephone Encounter (Signed)
Pt was given a rx for a beta blocker metoprolol 12.5 MG BID. The rx stated he BP was low already and he didn't want her to fill it until she saw [place]. Pts concerned and wants to see someone. OK to see PA? Since we do not have any available appt for pt.

## 2013-09-14 NOTE — Telephone Encounter (Signed)
Last note was put in by ERROR

## 2013-09-25 ENCOUNTER — Ambulatory Visit (INDEPENDENT_AMBULATORY_CARE_PROVIDER_SITE_OTHER): Payer: Medicare Other | Admitting: Pharmacist

## 2013-09-25 DIAGNOSIS — I4891 Unspecified atrial fibrillation: Secondary | ICD-10-CM

## 2013-09-25 LAB — POCT INR: INR: 2.8

## 2013-09-28 DIAGNOSIS — G518 Other disorders of facial nerve: Secondary | ICD-10-CM | POA: Diagnosis not present

## 2013-09-28 DIAGNOSIS — R269 Unspecified abnormalities of gait and mobility: Secondary | ICD-10-CM | POA: Diagnosis not present

## 2013-10-01 ENCOUNTER — Other Ambulatory Visit: Payer: Self-pay

## 2013-10-11 ENCOUNTER — Encounter: Payer: Self-pay | Admitting: Cardiology

## 2013-10-11 DIAGNOSIS — N179 Acute kidney failure, unspecified: Secondary | ICD-10-CM | POA: Insufficient documentation

## 2013-10-11 DIAGNOSIS — Z7901 Long term (current) use of anticoagulants: Secondary | ICD-10-CM | POA: Insufficient documentation

## 2013-10-11 DIAGNOSIS — I10 Essential (primary) hypertension: Secondary | ICD-10-CM | POA: Insufficient documentation

## 2013-10-11 DIAGNOSIS — K760 Fatty (change of) liver, not elsewhere classified: Secondary | ICD-10-CM | POA: Insufficient documentation

## 2013-10-11 DIAGNOSIS — G4733 Obstructive sleep apnea (adult) (pediatric): Secondary | ICD-10-CM | POA: Insufficient documentation

## 2013-10-11 DIAGNOSIS — E785 Hyperlipidemia, unspecified: Secondary | ICD-10-CM | POA: Insufficient documentation

## 2013-10-12 ENCOUNTER — Ambulatory Visit (INDEPENDENT_AMBULATORY_CARE_PROVIDER_SITE_OTHER): Payer: Medicare Other | Admitting: Cardiology

## 2013-10-12 ENCOUNTER — Encounter: Payer: Self-pay | Admitting: Cardiology

## 2013-10-12 VITALS — BP 118/74 | HR 54 | Ht 68.0 in | Wt 201.0 lb

## 2013-10-12 DIAGNOSIS — I2510 Atherosclerotic heart disease of native coronary artery without angina pectoris: Secondary | ICD-10-CM | POA: Diagnosis not present

## 2013-10-12 DIAGNOSIS — I4891 Unspecified atrial fibrillation: Secondary | ICD-10-CM

## 2013-10-12 DIAGNOSIS — E785 Hyperlipidemia, unspecified: Secondary | ICD-10-CM

## 2013-10-12 DIAGNOSIS — Z7901 Long term (current) use of anticoagulants: Secondary | ICD-10-CM

## 2013-10-12 DIAGNOSIS — G4733 Obstructive sleep apnea (adult) (pediatric): Secondary | ICD-10-CM

## 2013-10-12 DIAGNOSIS — I10 Essential (primary) hypertension: Secondary | ICD-10-CM

## 2013-10-12 NOTE — Patient Instructions (Signed)
Your physician has recommended you make the following change in your medication: STOP Digoxin  Your physician wants you to follow-up in: 6 MONTHS with Dr Mayford Knife.  You will receive a reminder letter in the mail two months in advance. If you don't receive a letter, please call our office to schedule the follow-up appointment.

## 2013-10-12 NOTE — Progress Notes (Addendum)
[address] [place], [place]  [address] Phone: [phone] Fax:  [phone]  Date:  10/15/2013   ID:  Troy [name], DOB February 03, 1941, MRN [id number]  PCP:  Emeterio Reeve, MD  Cardiologist:  Armanda Magic, MD   History of Present Illness: Troy [name] is a 72 y.o. male with a history of ASCAD, HTN, OSA now using oral device, dyslipdiemia and atrial fibrillation who presents today for followup.  He denies any chest pain, SOB, DOE, LE edema, dizziness, palpitations or syncope.  He tolerates the oral device for OSA.  He feels rested when he gets up and has no daytime sleepiness.     Wt Readings from Last 3 Encounters:  10/12/13 201 lb (91.173 kg)  04/14/13 195 lb (88.451 kg)  03/26/13 188 lb (85.276 kg)     Past Medical History  Diagnosis Date  . Constipation   . Diabetes mellitus   . Facial nerve spasm     L eye, tx'd with botox  . Fecal impaction   . Hypertension   . Hypothyroidism   . Hyperlipidemia   . Diverticulosis   . Gout   . CKD (chronic kidney disease) stage 2, GFR 60-89 ml/min   . OSA (obstructive sleep apnea)     intolerant to CPAP followed by Dr. Delton Coombes  . AAA (abdominal aortic aneurysm)     with mural thrombus  . Hepatic steatosis   . Coronary artery disease 07/2009    severe 3 vessel ASCAD s/p CABG with LIMA to LAD, SVG to diag, SVG to PL  . PAF (paroxysmal atrial fibrillation)   . Chronic anticoagulation     Current Outpatient Prescriptions  Medication Sig Dispense Refill  . aspirin 81 MG tablet Take 81 mg by mouth daily.        . Cholecalciferol (VITAMIN D-3) 1000 UNITS CAPS Take 1,000 Units by mouth daily.      . Cinnamon 500 MG capsule Take 500 mg by mouth 2 (two) times daily.      . clobetasol (TEMOVATE) 0.05 % external solution       . DHA-EPA-Vitamin E 200-300-5 MG-MG-UNIT CAPS Take 1 capsule by mouth 2 (two) times daily.      Marland Kitchen donepezil (ARICEPT) 10 MG tablet Take 10 mg by mouth at bedtime.      Marland Kitchen EPIPEN 2-PAK 0.3 MG/0.3ML  SOAJ       . fluticasone (FLONASE) 50 MCG/ACT nasal spray Place 2 sprays into the nose every evening.      . folic acid-pyridoxine-cyancobalamin (FOLTX) 2.5-25-2 MG TABS Take 1 tablet by mouth at bedtime. PRN Fish oil for dry eye.. 2 Tabs.      Marland Kitchen GRAPE SEED EXTRACT PO Take 500 mg by mouth daily. OTC      . ketoconazole (NIZORAL) 2 % shampoo as needed.      Marland Kitchen levothyroxine (SYNTHROID, LEVOTHROID) 125 MCG tablet Take 125 mcg by mouth daily.        Marland Kitchen lisinopril (PRINIVIL,ZESTRIL) 40 MG tablet Take 40 mg by mouth daily.        . nebivolol (BYSTOLIC) 5 MG tablet Take 5 mg by mouth daily.      . rosuvastatin (CRESTOR) 5 MG tablet Take 5 mg by mouth daily.      Marland Kitchen warfarin (COUMADIN) 5 MG tablet Take 5-7.5 mg by mouth daily. Mon, Wed, Fri, Fri, Sat,Sun   Takes 5mg  and on Tue, Thurs takes 7.5mg       . amLODipine (NORVASC) 10  MG tablet take 1 tablet by mouth once daily  30 tablet  5  . Carbidopa-Levodopa ER (SINEMET CR) 25-100 MG tablet controlled release Take 1 tablet by mouth daily.       No current facility-administered medications for this visit.    Allergies:    Allergies  Allergen Reactions  . Prinivil [Lisinopril] Anaphylaxis  . Penicillins Rash  . Tetanus Toxoids Rash    Social History:  The patient  reports that he quit smoking about 5 years ago. His smoking use included Cigarettes. He smoked 0.00 packs per day for 15 years. He has never used smokeless tobacco. He reports that he drinks about 1.2 ounces of alcohol per week. He reports that he does not use illicit drugs.   Family History:  The patient's family history includes Alzheimer's disease in his brother, father, mother, and paternal uncle; Heart disease in his mother.   ROS:  Please see the history of present illness.      All other systems reviewed and negative.   PHYSICAL EXAM: VS:  BP 118/74  Pulse 54  Ht 5\' 8"  (1.727 m)  Wt 201 lb (91.173 kg)  BMI 30.57 kg/m2 Well nourished, well developed, in no acute distress HEENT:  normal Neck: no JVD Cardiac:  normal S1, S2; RRR; no murmur Lungs:  clear to auscultation bilaterally, no wheezing, rhonchi or rales Abd: soft, nontender, no hepatomegaly Ext: trace edema Skin: warm and dry Neuro:  CNs 2-12 intact, no focal abnormalities noted  EKG:  Sinus bradycardia with nonspecific ST abnormality     ASSESSMENT AND PLAN:  1. ASCAD with no angina  - continue ASA 2. OSA using mouth appliance 3. Dyslipidemia - lipids are at goal  - continue Crestor/fish oil 4. HTN - controlled  - continue Lisinopril/Bystolic/amlodipine 5. Atrial fibrillation - maintaining sinus bradycardia  - continue Bystolic/warfarin  - he would like to stop the digoxin because he is on so many meds.  He has resting bradycardia so I thinks it is fine to stop.  Followup with me in 6 months  Signed, Armanda Magic, MD 10/15/2013 10:07 PM

## 2013-10-13 ENCOUNTER — Other Ambulatory Visit: Payer: Self-pay | Admitting: Cardiology

## 2013-10-20 ENCOUNTER — Ambulatory Visit: Payer: Medicare Other | Admitting: Vascular Surgery

## 2013-10-20 ENCOUNTER — Other Ambulatory Visit: Payer: Medicare Other

## 2013-10-28 DIAGNOSIS — H40019 Open angle with borderline findings, low risk, unspecified eye: Secondary | ICD-10-CM | POA: Diagnosis not present

## 2013-10-28 DIAGNOSIS — H04329 Acute dacryocystitis of unspecified lacrimal passage: Secondary | ICD-10-CM | POA: Diagnosis not present

## 2013-10-29 ENCOUNTER — Ambulatory Visit (INDEPENDENT_AMBULATORY_CARE_PROVIDER_SITE_OTHER): Payer: Medicare Other | Admitting: Pharmacist

## 2013-10-29 DIAGNOSIS — I4891 Unspecified atrial fibrillation: Secondary | ICD-10-CM | POA: Diagnosis not present

## 2013-10-29 LAB — POCT INR: INR: 3.3

## 2013-11-02 ENCOUNTER — Other Ambulatory Visit: Payer: Self-pay | Admitting: Vascular Surgery

## 2013-11-02 DIAGNOSIS — R269 Unspecified abnormalities of gait and mobility: Secondary | ICD-10-CM | POA: Diagnosis not present

## 2013-11-02 DIAGNOSIS — F09 Unspecified mental disorder due to known physiological condition: Secondary | ICD-10-CM | POA: Diagnosis not present

## 2013-11-02 DIAGNOSIS — I714 Abdominal aortic aneurysm, without rupture: Secondary | ICD-10-CM | POA: Diagnosis not present

## 2013-11-02 DIAGNOSIS — R209 Unspecified disturbances of skin sensation: Secondary | ICD-10-CM | POA: Diagnosis not present

## 2013-11-02 LAB — CREATININE, SERUM: Creat: 0.92 mg/dL (ref 0.50–1.35)

## 2013-11-03 ENCOUNTER — Ambulatory Visit: Payer: Medicare Other | Admitting: Vascular Surgery

## 2013-11-03 ENCOUNTER — Ambulatory Visit
Admission: RE | Admit: 2013-11-03 | Discharge: 2013-11-03 | Disposition: A | Discharge status: Home / Self Care | Payer: Medicare Other | Source: Ambulatory Visit | Attending: Vascular Surgery | Admitting: Vascular Surgery

## 2013-11-03 DIAGNOSIS — I714 Abdominal aortic aneurysm, without rupture: Secondary | ICD-10-CM | POA: Diagnosis not present

## 2013-11-03 DIAGNOSIS — Z48812 Encounter for surgical aftercare following surgery on the circulatory system: Secondary | ICD-10-CM

## 2013-11-03 MED ORDER — IOHEXOL 350 MG/ML SOLN
80.0000 mL | Freq: Once | INTRAVENOUS | Status: AC | PRN
  Administered 2013-11-03: 80 mL via INTRAVENOUS

## 2013-11-04 DIAGNOSIS — L570 Actinic keratosis: Secondary | ICD-10-CM | POA: Diagnosis not present

## 2013-11-04 DIAGNOSIS — D1801 Hemangioma of skin and subcutaneous tissue: Secondary | ICD-10-CM | POA: Diagnosis not present

## 2013-11-04 DIAGNOSIS — L408 Other psoriasis: Secondary | ICD-10-CM | POA: Diagnosis not present

## 2013-11-04 DIAGNOSIS — L821 Other seborrheic keratosis: Secondary | ICD-10-CM | POA: Diagnosis not present

## 2013-11-05 ENCOUNTER — Encounter: Payer: Self-pay | Admitting: Pharmacist

## 2013-11-06 DIAGNOSIS — G608 Other hereditary and idiopathic neuropathies: Secondary | ICD-10-CM | POA: Diagnosis not present

## 2013-11-06 DIAGNOSIS — R209 Unspecified disturbances of skin sensation: Secondary | ICD-10-CM | POA: Diagnosis not present

## 2013-11-09 ENCOUNTER — Encounter: Payer: Self-pay | Admitting: Vascular Surgery

## 2013-11-10 ENCOUNTER — Encounter: Payer: Self-pay | Admitting: Vascular Surgery

## 2013-11-10 ENCOUNTER — Ambulatory Visit (INDEPENDENT_AMBULATORY_CARE_PROVIDER_SITE_OTHER): Payer: Medicare Other | Admitting: Vascular Surgery

## 2013-11-10 VITALS — BP 124/74 | HR 63 | Resp 16 | Ht 67.0 in | Wt 197.0 lb

## 2013-11-10 DIAGNOSIS — Z48812 Encounter for surgical aftercare following surgery on the circulatory system: Secondary | ICD-10-CM | POA: Diagnosis not present

## 2013-11-10 DIAGNOSIS — I714 Abdominal aortic aneurysm, without rupture, unspecified: Secondary | ICD-10-CM

## 2013-11-10 DIAGNOSIS — Z5189 Encounter for other specified aftercare: Secondary | ICD-10-CM

## 2013-11-10 DIAGNOSIS — IMO0002 Reserved for concepts with insufficient information to code with codable children: Secondary | ICD-10-CM | POA: Insufficient documentation

## 2013-11-10 DIAGNOSIS — T82330D Leakage of aortic (bifurcation) graft (replacement), subsequent encounter: Secondary | ICD-10-CM

## 2013-11-10 DIAGNOSIS — T82330A Leakage of aortic (bifurcation) graft (replacement), initial encounter: Secondary | ICD-10-CM | POA: Insufficient documentation

## 2013-11-10 NOTE — Progress Notes (Signed)
Subjective:     Patient ID: Troy [name], male   DOB: 26-Feb-1941, 72 y.o.   MRN: [id number]  HPI this 72 year old male returns for continued followup regarding his aortic stent graft which was inserted in March of 2011 for an abdominal aortic aneurysm. He has had a small type II endoleak which we have been following in the aneurysm sac size has remained fairly constant. He denies any abdominal or back symptoms. He does have a gait problem and is being evaluated at [facility] with possible Parkinson's as a diagnosis.  Past Medical History  Diagnosis Date  . Constipation   . Diabetes mellitus   . Facial nerve spasm     L eye, tx'd with botox  . Fecal impaction   . Hypertension   . Hypothyroidism   . Hyperlipidemia   . Diverticulosis   . Gout   . CKD (chronic kidney disease) stage 2, GFR 60-89 ml/min   . OSA (obstructive sleep apnea)     intolerant to CPAP followed by Dr. Delton Coombes  . AAA (abdominal aortic aneurysm)     with mural thrombus  . Hepatic steatosis   . Coronary artery disease 07/2009    severe 3 vessel ASCAD s/p CABG with LIMA to LAD, SVG to diag, SVG to PL  . PAF (paroxysmal atrial fibrillation)   . Chronic anticoagulation     History  Substance Use Topics  . Smoking status: Former Smoker -- 15 years    Types: Cigarettes    Quit date: 10/07/2008  . Smokeless tobacco: Never Used  . Alcohol Use: 1.2 oz/week    2 Glasses of wine per week    Family History  Problem Relation Age of Onset  . Heart disease Mother     CHF  . Alzheimer's disease Mother   . Alzheimer's disease Father   . Alzheimer's disease Brother   . Alzheimer's disease Paternal Uncle     Allergies  Allergen Reactions  . Prinivil [Lisinopril] Anaphylaxis  . Penicillins Rash  . Tetanus Toxoids Rash    Current outpatient prescriptions:Cholecalciferol (VITAMIN D-3) 1000 UNITS CAPS, Take 1,000 Units by mouth daily., Disp: , Rfl: ;  Cinnamon 500 MG capsule, Take 500 mg by mouth 2 (two) times  daily., Disp: , Rfl: ;  clobetasol (TEMOVATE) 0.05 % external solution, , Disp: , Rfl: ;  DHA-EPA-Vitamin E 200-300-5 MG-MG-UNIT CAPS, Take 1 capsule by mouth 2 (two) times daily., Disp: , Rfl:  donepezil (ARICEPT) 10 MG tablet, Take 10 mg by mouth at bedtime., Disp: , Rfl: ;  EPIPEN 2-PAK 0.3 MG/0.3ML SOAJ, , Disp: , Rfl: ;  fluticasone (FLONASE) 50 MCG/ACT nasal spray, Place 2 sprays into the nose every evening., Disp: , Rfl: ;  folic acid-pyridoxine-cyancobalamin (FOLTX) 2.5-25-2 MG TABS, Take 1 tablet by mouth at bedtime. PRN Fish oil for dry eye.. 2 Tabs., Disp: , Rfl:  GRAPE SEED EXTRACT PO, Take 500 mg by mouth daily. OTC, Disp: , Rfl: ;  ketoconazole (NIZORAL) 2 % shampoo, as needed., Disp: , Rfl: ;  levothyroxine (SYNTHROID, LEVOTHROID) 125 MCG tablet, Take 125 mcg by mouth daily.  , Disp: , Rfl: ;  lisinopril (PRINIVIL,ZESTRIL) 40 MG tablet, Take 40 mg by mouth daily.  , Disp: , Rfl: ;  nebivolol (BYSTOLIC) 5 MG tablet, Take 5 mg by mouth daily., Disp: , Rfl:  rosuvastatin (CRESTOR) 5 MG tablet, Take 5 mg by mouth daily., Disp: , Rfl: ;  warfarin (COUMADIN) 5 MG tablet, Take 5-7.5 mg by mouth daily.  Mon, Wed, Fri, Fri, Sat,Sun   Takes 5mg  and on Tue, Thurs takes 7.5mg , Disp: , Rfl: ;  amLODipine (NORVASC) 10 MG tablet, take 1 tablet by mouth once daily, Disp: 30 tablet, Rfl: 5;  aspirin 81 MG tablet, Take 81 mg by mouth daily.  , Disp: , Rfl:  Carbidopa-Levodopa ER (SINEMET CR) 25-100 MG tablet controlled release, Take 1 tablet by mouth 3 (three) times daily. , Disp: , Rfl:   BP 124/74  Pulse 63  Resp 16  Ht 5\' 7"  (1.702 m)  Wt 197 lb (89.359 kg)  BMI 30.85 kg/m2  Body mass index is 30.85 kg/(m^2).          Review of Systems denies chest pain, dyspnea on exertion, PND, orthopnea, hemoptysis. Does have occasional difficulty speaking and unsteady gait. Other systems negative and complete review of systems    Objective:   Physical Exam BP 124/74  Pulse 63  Resp 16  Ht 5\' 7"  (1.702  m)  Wt 197 lb (89.359 kg)  BMI 30.85 kg/m2  Gen.-alert and oriented x3 in no apparent distress HEENT normal for age Lungs no rhonchi or wheezing Cardiovascular regular rhythm no murmurs carotid pulses 3+ palpable no bruits audible Abdomen soft nontender no palpable masses Musculoskeletal free of  major deformities Skin clear -no rashes Neurologic normal Lower extremities 3+ femoral and posterior tibial pulses palpable bilaterally with no edema  Today I reviewed the CT angiogram by computer. The endoleak-type II is much less obvious-quite subtle but possibly still present. Aneurysm sac is actually measured 2 mm smaller than last visit 5.2 cm today        Assessment:     Doing well 3 and half years post endovascular stent graft repair of abdominal aortic aneurysm with small type II endoleak and stable sac size    Plan:     Return in one year with repeat CT angiogram for continued followup

## 2013-11-11 ENCOUNTER — Encounter: Payer: Self-pay | Admitting: *Deleted

## 2013-11-11 NOTE — Addendum Note (Signed)
Addended by: Sharee Pimple on: 11/11/2013 10:29 AM   Modules accepted: Orders

## 2013-11-16 ENCOUNTER — Ambulatory Visit (INDEPENDENT_AMBULATORY_CARE_PROVIDER_SITE_OTHER): Payer: Medicare Other | Admitting: Pharmacist

## 2013-11-16 DIAGNOSIS — I4891 Unspecified atrial fibrillation: Secondary | ICD-10-CM

## 2013-11-16 LAB — POCT INR: INR: 2.7

## 2013-12-04 DIAGNOSIS — H43819 Vitreous degeneration, unspecified eye: Secondary | ICD-10-CM | POA: Diagnosis not present

## 2013-12-04 DIAGNOSIS — H40019 Open angle with borderline findings, low risk, unspecified eye: Secondary | ICD-10-CM | POA: Diagnosis not present

## 2013-12-04 DIAGNOSIS — H353190 Nonexudative age-related macular degeneration, unspecified eye, stage unspecified: Secondary | ICD-10-CM | POA: Diagnosis not present

## 2013-12-04 DIAGNOSIS — H01009 Unspecified blepharitis unspecified eye, unspecified eyelid: Secondary | ICD-10-CM | POA: Diagnosis not present

## 2013-12-04 DIAGNOSIS — H524 Presbyopia: Secondary | ICD-10-CM | POA: Diagnosis not present

## 2013-12-04 DIAGNOSIS — Z961 Presence of intraocular lens: Secondary | ICD-10-CM | POA: Diagnosis not present

## 2013-12-04 DIAGNOSIS — H04129 Dry eye syndrome of unspecified lacrimal gland: Secondary | ICD-10-CM | POA: Diagnosis not present

## 2013-12-04 DIAGNOSIS — H35039 Hypertensive retinopathy, unspecified eye: Secondary | ICD-10-CM | POA: Diagnosis not present

## 2013-12-07 DIAGNOSIS — F09 Unspecified mental disorder due to known physiological condition: Secondary | ICD-10-CM | POA: Diagnosis not present

## 2013-12-07 DIAGNOSIS — R269 Unspecified abnormalities of gait and mobility: Secondary | ICD-10-CM | POA: Diagnosis not present

## 2013-12-07 DIAGNOSIS — Z5181 Encounter for therapeutic drug level monitoring: Secondary | ICD-10-CM | POA: Diagnosis not present

## 2013-12-14 ENCOUNTER — Other Ambulatory Visit: Payer: Self-pay | Admitting: Cardiology

## 2013-12-16 DIAGNOSIS — G20 Parkinson's disease: Secondary | ICD-10-CM | POA: Diagnosis not present

## 2013-12-17 ENCOUNTER — Ambulatory Visit (INDEPENDENT_AMBULATORY_CARE_PROVIDER_SITE_OTHER): Payer: Medicare Other | Admitting: Pharmacist

## 2013-12-17 DIAGNOSIS — I4891 Unspecified atrial fibrillation: Secondary | ICD-10-CM

## 2013-12-17 DIAGNOSIS — Z5181 Encounter for therapeutic drug level monitoring: Secondary | ICD-10-CM | POA: Diagnosis not present

## 2013-12-17 LAB — POCT INR: INR: 2

## 2013-12-23 DIAGNOSIS — H26499 Other secondary cataract, unspecified eye: Secondary | ICD-10-CM | POA: Diagnosis not present

## 2014-01-05 ENCOUNTER — Ambulatory Visit (INDEPENDENT_AMBULATORY_CARE_PROVIDER_SITE_OTHER): Payer: Medicare Other | Admitting: *Deleted

## 2014-01-05 DIAGNOSIS — I4891 Unspecified atrial fibrillation: Secondary | ICD-10-CM | POA: Diagnosis not present

## 2014-01-05 DIAGNOSIS — Z5181 Encounter for therapeutic drug level monitoring: Secondary | ICD-10-CM

## 2014-01-05 LAB — POCT INR: INR: 1.9

## 2014-01-11 DIAGNOSIS — G518 Other disorders of facial nerve: Secondary | ICD-10-CM | POA: Diagnosis not present

## 2014-01-26 ENCOUNTER — Ambulatory Visit (INDEPENDENT_AMBULATORY_CARE_PROVIDER_SITE_OTHER): Payer: Medicare Other | Admitting: *Deleted

## 2014-01-26 DIAGNOSIS — Z5181 Encounter for therapeutic drug level monitoring: Secondary | ICD-10-CM | POA: Diagnosis not present

## 2014-01-26 DIAGNOSIS — I4891 Unspecified atrial fibrillation: Secondary | ICD-10-CM

## 2014-01-26 LAB — POCT INR: INR: 2

## 2014-02-01 ENCOUNTER — Other Ambulatory Visit: Payer: Self-pay | Admitting: Cardiology

## 2014-02-23 ENCOUNTER — Ambulatory Visit (INDEPENDENT_AMBULATORY_CARE_PROVIDER_SITE_OTHER): Payer: Medicare Other

## 2014-02-23 DIAGNOSIS — Z5181 Encounter for therapeutic drug level monitoring: Secondary | ICD-10-CM | POA: Diagnosis not present

## 2014-02-23 DIAGNOSIS — I4891 Unspecified atrial fibrillation: Secondary | ICD-10-CM

## 2014-02-23 LAB — POCT INR: INR: 1.9

## 2014-02-24 ENCOUNTER — Encounter: Payer: Self-pay | Admitting: Cardiology

## 2014-02-25 ENCOUNTER — Ambulatory Visit: Payer: Medicare Other | Attending: Psychiatry | Admitting: Physical Therapy

## 2014-02-25 DIAGNOSIS — IMO0001 Reserved for inherently not codable concepts without codable children: Secondary | ICD-10-CM | POA: Insufficient documentation

## 2014-02-25 DIAGNOSIS — G20A1 Parkinson's disease without dyskinesia, without mention of fluctuations: Secondary | ICD-10-CM | POA: Insufficient documentation

## 2014-02-25 DIAGNOSIS — R269 Unspecified abnormalities of gait and mobility: Secondary | ICD-10-CM | POA: Diagnosis not present

## 2014-02-25 DIAGNOSIS — G20 Parkinson's disease: Secondary | ICD-10-CM | POA: Diagnosis not present

## 2014-03-17 ENCOUNTER — Ambulatory Visit: Payer: Medicare Other | Admitting: Physical Therapy

## 2014-03-17 DIAGNOSIS — IMO0001 Reserved for inherently not codable concepts without codable children: Secondary | ICD-10-CM | POA: Diagnosis not present

## 2014-03-18 ENCOUNTER — Ambulatory Visit: Payer: Medicare Other | Admitting: Physical Therapy

## 2014-03-18 DIAGNOSIS — IMO0001 Reserved for inherently not codable concepts without codable children: Secondary | ICD-10-CM | POA: Diagnosis not present

## 2014-03-22 ENCOUNTER — Ambulatory Visit: Payer: Medicare Other | Admitting: Physical Therapy

## 2014-03-22 DIAGNOSIS — IMO0001 Reserved for inherently not codable concepts without codable children: Secondary | ICD-10-CM | POA: Diagnosis not present

## 2014-03-23 ENCOUNTER — Ambulatory Visit (INDEPENDENT_AMBULATORY_CARE_PROVIDER_SITE_OTHER): Payer: Medicare Other | Admitting: Pharmacist

## 2014-03-23 DIAGNOSIS — I4891 Unspecified atrial fibrillation: Secondary | ICD-10-CM

## 2014-03-23 DIAGNOSIS — Z5181 Encounter for therapeutic drug level monitoring: Secondary | ICD-10-CM

## 2014-03-23 LAB — POCT INR: INR: 1.9

## 2014-03-24 ENCOUNTER — Ambulatory Visit: Payer: Medicare Other | Admitting: Physical Therapy

## 2014-03-24 DIAGNOSIS — IMO0001 Reserved for inherently not codable concepts without codable children: Secondary | ICD-10-CM | POA: Diagnosis not present

## 2014-03-24 DIAGNOSIS — D119 Benign neoplasm of major salivary gland, unspecified: Secondary | ICD-10-CM | POA: Diagnosis not present

## 2014-03-25 ENCOUNTER — Other Ambulatory Visit (HOSPITAL_COMMUNITY): Payer: Self-pay | Admitting: Otolaryngology

## 2014-03-25 ENCOUNTER — Ambulatory Visit: Payer: Medicare Other | Admitting: Physical Therapy

## 2014-03-25 DIAGNOSIS — K118 Other diseases of salivary glands: Secondary | ICD-10-CM

## 2014-03-25 DIAGNOSIS — IMO0001 Reserved for inherently not codable concepts without codable children: Secondary | ICD-10-CM | POA: Diagnosis not present

## 2014-03-26 ENCOUNTER — Telehealth: Payer: Self-pay | Admitting: Cardiology

## 2014-03-26 NOTE — Telephone Encounter (Signed)
yes

## 2014-03-26 NOTE — Telephone Encounter (Signed)
New message      Need a note in writing saying pt can stop coumadin  4 days prior to 5-7 ultrasound biopsy.  Need to stop coumadin Sunday, may 3rd.  Fax note  [phone].

## 2014-03-26 NOTE — Telephone Encounter (Signed)
TO Dr Radford Pax ok to send for pt?

## 2014-03-29 ENCOUNTER — Ambulatory Visit: Payer: Medicare Other | Attending: Psychiatry | Admitting: Physical Therapy

## 2014-03-29 ENCOUNTER — Other Ambulatory Visit: Payer: Self-pay | Admitting: Radiology

## 2014-03-29 ENCOUNTER — Encounter: Payer: Self-pay | Admitting: Pharmacist

## 2014-03-29 DIAGNOSIS — G20 Parkinson's disease: Secondary | ICD-10-CM | POA: Insufficient documentation

## 2014-03-29 DIAGNOSIS — G20A1 Parkinson's disease without dyskinesia, without mention of fluctuations: Secondary | ICD-10-CM | POA: Insufficient documentation

## 2014-03-29 DIAGNOSIS — IMO0001 Reserved for inherently not codable concepts without codable children: Secondary | ICD-10-CM | POA: Diagnosis not present

## 2014-03-29 DIAGNOSIS — R269 Unspecified abnormalities of gait and mobility: Secondary | ICD-10-CM | POA: Diagnosis not present

## 2014-03-30 NOTE — Telephone Encounter (Signed)
Faxed for pt.

## 2014-03-31 ENCOUNTER — Ambulatory Visit: Payer: Medicare Other | Admitting: Physical Therapy

## 2014-03-31 DIAGNOSIS — R269 Unspecified abnormalities of gait and mobility: Secondary | ICD-10-CM | POA: Diagnosis not present

## 2014-03-31 DIAGNOSIS — G20 Parkinson's disease: Secondary | ICD-10-CM | POA: Diagnosis not present

## 2014-03-31 DIAGNOSIS — IMO0001 Reserved for inherently not codable concepts without codable children: Secondary | ICD-10-CM | POA: Diagnosis not present

## 2014-04-01 ENCOUNTER — Ambulatory Visit (HOSPITAL_COMMUNITY)
Admission: RE | Admit: 2014-04-01 | Discharge: 2014-04-01 | Disposition: A | Discharge status: Home / Self Care | Payer: Medicare Other | Source: Ambulatory Visit | Attending: Otolaryngology | Admitting: Otolaryngology

## 2014-04-01 ENCOUNTER — Ambulatory Visit (HOSPITAL_COMMUNITY): Admission: RE | Admit: 2014-04-01 | Payer: Medicare Other | Source: Ambulatory Visit

## 2014-04-01 ENCOUNTER — Encounter (HOSPITAL_COMMUNITY): Payer: Self-pay | Admitting: Pharmacy Technician

## 2014-04-01 ENCOUNTER — Encounter: Payer: Self-pay | Admitting: Pharmacist

## 2014-04-01 ENCOUNTER — Encounter (HOSPITAL_COMMUNITY): Payer: Self-pay

## 2014-04-01 DIAGNOSIS — I714 Abdominal aortic aneurysm, without rupture, unspecified: Secondary | ICD-10-CM | POA: Insufficient documentation

## 2014-04-01 DIAGNOSIS — G518 Other disorders of facial nerve: Secondary | ICD-10-CM | POA: Diagnosis not present

## 2014-04-01 DIAGNOSIS — I129 Hypertensive chronic kidney disease with stage 1 through stage 4 chronic kidney disease, or unspecified chronic kidney disease: Secondary | ICD-10-CM | POA: Diagnosis not present

## 2014-04-01 DIAGNOSIS — K7689 Other specified diseases of liver: Secondary | ICD-10-CM | POA: Insufficient documentation

## 2014-04-01 DIAGNOSIS — E119 Type 2 diabetes mellitus without complications: Secondary | ICD-10-CM | POA: Diagnosis not present

## 2014-04-01 DIAGNOSIS — I2510 Atherosclerotic heart disease of native coronary artery without angina pectoris: Secondary | ICD-10-CM | POA: Insufficient documentation

## 2014-04-01 DIAGNOSIS — K116 Mucocele of salivary gland: Secondary | ICD-10-CM | POA: Insufficient documentation

## 2014-04-01 DIAGNOSIS — Z951 Presence of aortocoronary bypass graft: Secondary | ICD-10-CM | POA: Diagnosis not present

## 2014-04-01 DIAGNOSIS — I4891 Unspecified atrial fibrillation: Secondary | ICD-10-CM | POA: Diagnosis not present

## 2014-04-01 DIAGNOSIS — E785 Hyperlipidemia, unspecified: Secondary | ICD-10-CM | POA: Insufficient documentation

## 2014-04-01 DIAGNOSIS — E039 Hypothyroidism, unspecified: Secondary | ICD-10-CM | POA: Diagnosis not present

## 2014-04-01 DIAGNOSIS — G4733 Obstructive sleep apnea (adult) (pediatric): Secondary | ICD-10-CM | POA: Insufficient documentation

## 2014-04-01 DIAGNOSIS — M109 Gout, unspecified: Secondary | ICD-10-CM | POA: Diagnosis not present

## 2014-04-01 DIAGNOSIS — R29810 Facial weakness: Secondary | ICD-10-CM | POA: Insufficient documentation

## 2014-04-01 DIAGNOSIS — Z7901 Long term (current) use of anticoagulants: Secondary | ICD-10-CM | POA: Insufficient documentation

## 2014-04-01 DIAGNOSIS — N182 Chronic kidney disease, stage 2 (mild): Secondary | ICD-10-CM | POA: Insufficient documentation

## 2014-04-01 DIAGNOSIS — Z87891 Personal history of nicotine dependence: Secondary | ICD-10-CM | POA: Diagnosis not present

## 2014-04-01 DIAGNOSIS — K118 Other diseases of salivary glands: Secondary | ICD-10-CM | POA: Diagnosis not present

## 2014-04-01 LAB — APTT: aPTT: 29 seconds (ref 24–37)

## 2014-04-01 LAB — PROTIME-INR
INR: 1.19 (ref 0.00–1.49)
PROTHROMBIN TIME: 14.8 s (ref 11.6–15.2)

## 2014-04-01 LAB — CBC
HCT: 43.8 % (ref 39.0–52.0)
Hemoglobin: 14.8 g/dL (ref 13.0–17.0)
MCH: 30.1 pg (ref 26.0–34.0)
MCHC: 33.8 g/dL (ref 30.0–36.0)
MCV: 89 fL (ref 78.0–100.0)
Platelets: 152 10*3/uL (ref 150–400)
RBC: 4.92 MIL/uL (ref 4.22–5.81)
RDW: 14.5 % (ref 11.5–15.5)
WBC: 7.7 10*3/uL (ref 4.0–10.5)

## 2014-04-01 LAB — GLUCOSE, CAPILLARY: Glucose-Capillary: 95 mg/dL (ref 70–99)

## 2014-04-01 MED ORDER — SODIUM CHLORIDE 0.9 % IV SOLN
INTRAVENOUS | Status: DC
  Administered 2014-04-01: 11:00:00 via INTRAVENOUS

## 2014-04-01 MED ORDER — MIDAZOLAM HCL 2 MG/2ML IJ SOLN
INTRAMUSCULAR | Status: AC
  Filled 2014-04-01: qty 2

## 2014-04-01 MED ORDER — FENTANYL CITRATE 0.05 MG/ML IJ SOLN
INTRAMUSCULAR | Status: AC
  Filled 2014-04-01: qty 2

## 2014-04-01 MED ORDER — HYDROCODONE-ACETAMINOPHEN 5-325 MG PO TABS
1.0000 | ORAL_TABLET | ORAL | Status: DC | PRN
  Filled 2014-04-01: qty 2

## 2014-04-01 NOTE — H&P (Signed)
Chief Complaint: "I'm here for a parotid cyst" Referring Physician:Byers HPI: Troy [name] is an 73 y.o. 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 He previously was found to have a left parotid cyst as well. He underwent aspiration and actually had marked improvement in his symptoms by the end of the procedure. Fluid was sent for cytology and was negative for malignancy. He has continued symptoms again and an US showed recurrence of the left parotid cyst. Dr. Janace Hoard has requested repeat aspiration for symptomatic relief as well as sending fluid for cytology again. PMHx and meds reviewed. On Coumadin for Afib but has held it past 4-5 days.  Past Medical History:  Past Medical History  Diagnosis Date  . Constipation   . Diabetes mellitus   . Facial nerve spasm     L eye, tx'd with botox  . Fecal impaction   . Hypertension   . Hypothyroidism   . Hyperlipidemia   . Diverticulosis   . Gout   . CKD (chronic kidney disease) stage 2, GFR 60-89 ml/min   . OSA (obstructive sleep apnea)     intolerant to CPAP followed by Dr. Lamonte Sakai  . AAA (abdominal aortic aneurysm)     with mural thrombus  . Hepatic steatosis   . Coronary artery disease 07/2009    severe 3 vessel ASCAD s/p CABG with LIMA to LAD, SVG to diag, SVG to PL  . PAF (paroxysmal atrial fibrillation)   . Chronic anticoagulation     Past Surgical History:  Past Surgical History  Procedure Laterality Date  . Coronary artery bypass graft  07-2009  . Spine surgery  1998  . Eye surgery Left May  2013    Cataract  . Eye surgery Right June 2013    Cataract  . Abdominal aortic aneurysm repair  01-2010    aorto-bi-iliac Gore Excluder stent graft    Family History:  Family History  Problem Relation Age of Onset  . Heart disease Mother     CHF  . Alzheimer's disease Mother   . Alzheimer's disease Father   . Alzheimer's disease Brother   . Alzheimer's disease Paternal Uncle     Social History:  reports that  he quit smoking about 5 years ago. His smoking use included Cigarettes. He smoked 0.00 packs per day for 15 years. He has never used smokeless tobacco. He reports that he drinks about 1.2 ounces of alcohol per week. He reports that he does not use illicit drugs.  Allergies:  Allergies  Allergen Reactions  . Penicillins Rash  . Tetanus Toxoids Rash    Medications:   Medication List    ASK your doctor about these medications       amLODipine 10 MG tablet  Commonly known as:  NORVASC  take 1 tablet by mouth once daily     aspirin EC 81 MG tablet  Take 81 mg by mouth daily.     BYSTOLIC 5 MG tablet  Generic drug:  nebivolol  take 1 tablet by mouth once daily     carbidopa-levodopa 50-200 MG per tablet  Commonly known as:  SINEMET CR  Take 1 tablet by mouth 3 (three) times daily.     carboxymethylcellulose 0.5 % Soln  Commonly known as:  REFRESH PLUS  Place 1 drop into both eyes 3 (three) times daily as needed (dry eyes).     Cinnamon 500 MG capsule  Take 500 mg by mouth daily.  clobetasol 0.05 % external solution  Commonly known as:  TEMOVATE  Apply 1 application topically 2 (two) times daily as needed (itching).     DHA-EPA-Vitamin E 053-976-7 MG-MG-UNIT Caps  Take 1 capsule by mouth 2 (two) times daily as needed (dry eyes).     donepezil 10 MG tablet  Commonly known as:  ARICEPT  Take 10 mg by mouth at bedtime.     EPIPEN 2-PAK 0.3 mg/0.3 mL Soaj injection  Generic drug:  EPINEPHrine  Inject 0.3 mg into the muscle once as needed. Allergic reaction     fluticasone 50 MCG/ACT nasal spray  Commonly known as:  FLONASE  Place 2 sprays into the nose daily as needed for allergies.     GRAPE SEED EXTRACT PO  Take 100 mg by mouth daily. OTC     ketoconazole 2 % shampoo  Commonly known as:  NIZORAL  Apply 1 application topically 2 (two) times daily as needed for irritation.     levothyroxine 125 MCG tablet  Commonly known as:  SYNTHROID, LEVOTHROID  Take 125  mcg by mouth daily.     lisinopril 40 MG tablet  Commonly known as:  PRINIVIL,ZESTRIL  Take 40 mg by mouth daily.     OVER THE COUNTER MEDICATION  Take 1 tablet by mouth daily. Macular degeneration supplement     OVER THE COUNTER MEDICATION  Take 1 capsule by mouth daily. Homocysteine supplement     rosuvastatin 5 MG tablet  Commonly known as:  CRESTOR  Take 5 mg by mouth daily.     Vitamin D-3 1000 UNITS Caps  Take 1,000 Units by mouth daily.     warfarin 5 MG tablet  Commonly known as:  COUMADIN  Take 5-7.5 mg by mouth daily. 7.5 mg on Tuesday and Friday and all other days is 5mg         Please HPI for pertinent positives, otherwise complete 10 system ROS negative.  Physical Exam: BP 143/88  Pulse 52  Temp(Src) 98.2 F (36.8 C) (Oral)  Resp 16  SpO2 97% There is no weight on file to calculate BMI.   General Appearance:  Alert, cooperative, no distress, appears stated age  Head:  Normocephalic, without obvious abnormality, atraumatic  ENT: Unremarkable  Neck: Supple, symmetrical, trachea midline  Lungs:   Clear to auscultation bilaterally, no w/r/r, respirations unlabored without use of accessory muscles.  Chest Wall:  No tenderness or deformity  Heart:  Regular rate and rhythm, S1, S2 normal, no murmur, rub or gallop.  Abdomen:   Soft, non-tender, non distended.  Extremities: Extremities normal, atraumatic, no cyanosis or edema  Pulses: 2+ and symmetric  Neurologic: Normal affect, no gross deficits.   Results for orders placed during the hospital encounter of 04/01/14 (from the past 48 hour(s))  APTT     Status: None   Collection Time    04/01/14 11:25 AM      Result Value Ref Range   aPTT 29  24 - 37 seconds  CBC     Status: None   Collection Time    04/01/14 11:25 AM      Result Value Ref Range   WBC 7.7  4.0 - 10.5 K/uL   RBC 4.92  4.22 - 5.81 MIL/uL   Hemoglobin 14.8  13.0 - 17.0 g/dL   HCT 43.8  39.0 - 52.0 %   MCV 89.0  78.0 - 100.0 fL   MCH 30.1   26.0 - 34.0 pg   MCHC 33.8  30.0 - 36.0 g/dL   RDW 14.5  11.5 - 15.5 %   Platelets 152  150 - 400 K/uL  PROTIME-INR     Status: None   Collection Time    04/01/14 11:25 AM      Result Value Ref Range   Prothrombin Time 14.8  11.6 - 15.2 seconds   INR 1.19  0.00 - 1.49   No results found.  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 For repeat US guided (L)parotid cyst aspiration. He tolerated procedure well last time without sedation. Procedure, risks, complications reviewed. We to avoid sedation but he is set up to receive it should he need it. Labs reviewed Consent signed in chart  Ascencion Dike PA-C 04/01/2014, 12:14 PM

## 2014-04-01 NOTE — Procedures (Signed)
US aspiration L parotid cyst 22ml blood tinged cloudy fluid, to cytology No complication No blood loss. See complete dictation in [facility]

## 2014-04-01 NOTE — Discharge Instructions (Signed)
Needle Biopsy °Care After °These instructions give you information on caring for yourself after your procedure. Your doctor may also give you more specific instructions. Call your doctor if you have any problems or questions after your procedure. °HOME CARE °· Rest for 4 hours after your biopsy, except for getting up to go to the bathroom or as told. °· Keep the places where the needles were put in clean and dry. °· Do not put powder or lotion on the sites. °· Do not shower until 24 hours after the test. Remove all bandages (dressings) before showering. °· Remove all bandages at least once every day. Gently clean the sites with soap and water. Keep putting a new bandage on until the skin is closed. °Finding out the results of your test °Ask your doctor when your test results will be ready. Make sure you follow up and get the test results. °GET HELP RIGHT AWAY IF:  °· You have shortness of breath or trouble breathing. °· You have pain or cramping in your belly (abdomen). °· You feel sick to your stomach (nauseous) or throw up (vomit). °· Any of the places where the needles were put in: °· Are puffy (swollen) or red. °· Are sore or hot to the touch. °· Are draining yellowish-white fluid (pus). °· Are bleeding after 10 minutes of pressing down on the site. Have someone keep pressing on any place that is bleeding until you see a doctor. °· You have any unusual pain that will not stop. °· You have a fever. °If you go to the emergency room, tell the nurse that you had a biopsy. Take this paper with you to show the nurse. °MAKE SURE YOU:  °· Understand these instructions. °· Will watch your condition. °· Will get help right away if you are not doing well or get worse. °Document Released: 10/25/2008 Document Revised: 02/04/2012 Document Reviewed: 10/25/2008 °[employer]® Patient Information ©2014 [employer]. ° ° °

## 2014-04-02 ENCOUNTER — Other Ambulatory Visit: Payer: Self-pay

## 2014-04-02 ENCOUNTER — Ambulatory Visit: Payer: Medicare Other | Admitting: Physical Therapy

## 2014-04-02 DIAGNOSIS — IMO0001 Reserved for inherently not codable concepts without codable children: Secondary | ICD-10-CM | POA: Diagnosis not present

## 2014-04-02 DIAGNOSIS — G20 Parkinson's disease: Secondary | ICD-10-CM | POA: Diagnosis not present

## 2014-04-02 DIAGNOSIS — R269 Unspecified abnormalities of gait and mobility: Secondary | ICD-10-CM | POA: Diagnosis not present

## 2014-04-02 MED ORDER — NEBIVOLOL HCL 5 MG PO TABS: ORAL_TABLET | ORAL | Status: DC

## 2014-04-05 ENCOUNTER — Ambulatory Visit: Payer: Medicare Other | Admitting: Physical Therapy

## 2014-04-05 DIAGNOSIS — IMO0001 Reserved for inherently not codable concepts without codable children: Secondary | ICD-10-CM | POA: Diagnosis not present

## 2014-04-05 DIAGNOSIS — G20 Parkinson's disease: Secondary | ICD-10-CM | POA: Diagnosis not present

## 2014-04-05 DIAGNOSIS — R269 Unspecified abnormalities of gait and mobility: Secondary | ICD-10-CM | POA: Diagnosis not present

## 2014-04-07 ENCOUNTER — Ambulatory Visit: Payer: Medicare Other | Admitting: Physical Therapy

## 2014-04-07 DIAGNOSIS — R269 Unspecified abnormalities of gait and mobility: Secondary | ICD-10-CM | POA: Diagnosis not present

## 2014-04-07 DIAGNOSIS — G20 Parkinson's disease: Secondary | ICD-10-CM | POA: Diagnosis not present

## 2014-04-07 DIAGNOSIS — IMO0001 Reserved for inherently not codable concepts without codable children: Secondary | ICD-10-CM | POA: Diagnosis not present

## 2014-04-09 ENCOUNTER — Ambulatory Visit: Payer: Medicare Other | Admitting: Physical Therapy

## 2014-04-12 DIAGNOSIS — G518 Other disorders of facial nerve: Secondary | ICD-10-CM | POA: Diagnosis not present

## 2014-04-15 DIAGNOSIS — K5900 Constipation, unspecified: Secondary | ICD-10-CM | POA: Diagnosis not present

## 2014-04-15 DIAGNOSIS — I714 Abdominal aortic aneurysm, without rupture, unspecified: Secondary | ICD-10-CM | POA: Diagnosis not present

## 2014-04-15 DIAGNOSIS — G4733 Obstructive sleep apnea (adult) (pediatric): Secondary | ICD-10-CM | POA: Diagnosis not present

## 2014-04-15 DIAGNOSIS — E039 Hypothyroidism, unspecified: Secondary | ICD-10-CM | POA: Diagnosis not present

## 2014-04-15 DIAGNOSIS — Z0000 Encounter for general adult medical examination without abnormal findings: Secondary | ICD-10-CM | POA: Diagnosis not present

## 2014-04-15 DIAGNOSIS — M109 Gout, unspecified: Secondary | ICD-10-CM | POA: Diagnosis not present

## 2014-04-15 DIAGNOSIS — I2510 Atherosclerotic heart disease of native coronary artery without angina pectoris: Secondary | ICD-10-CM | POA: Diagnosis not present

## 2014-04-15 DIAGNOSIS — E1129 Type 2 diabetes mellitus with other diabetic kidney complication: Secondary | ICD-10-CM | POA: Diagnosis not present

## 2014-04-15 DIAGNOSIS — Z79899 Other long term (current) drug therapy: Secondary | ICD-10-CM | POA: Diagnosis not present

## 2014-04-16 ENCOUNTER — Other Ambulatory Visit: Payer: Self-pay

## 2014-04-16 MED ORDER — AMLODIPINE BESYLATE 10 MG PO TABS: ORAL_TABLET | ORAL | Status: DC

## 2014-04-20 ENCOUNTER — Ambulatory Visit (INDEPENDENT_AMBULATORY_CARE_PROVIDER_SITE_OTHER): Payer: Medicare Other | Admitting: *Deleted

## 2014-04-20 DIAGNOSIS — I4891 Unspecified atrial fibrillation: Secondary | ICD-10-CM | POA: Diagnosis not present

## 2014-04-20 DIAGNOSIS — Z5181 Encounter for therapeutic drug level monitoring: Secondary | ICD-10-CM

## 2014-04-20 LAB — POCT INR: INR: 3.1

## 2014-04-23 ENCOUNTER — Ambulatory Visit: Payer: Medicare Other | Admitting: Cardiology

## 2014-05-03 DIAGNOSIS — G20 Parkinson's disease: Secondary | ICD-10-CM | POA: Diagnosis not present

## 2014-05-03 DIAGNOSIS — G518 Other disorders of facial nerve: Secondary | ICD-10-CM | POA: Diagnosis not present

## 2014-05-05 ENCOUNTER — Other Ambulatory Visit (INDEPENDENT_AMBULATORY_CARE_PROVIDER_SITE_OTHER): Payer: Medicare Other

## 2014-05-05 DIAGNOSIS — Z79899 Other long term (current) drug therapy: Secondary | ICD-10-CM

## 2014-05-05 DIAGNOSIS — E7800 Pure hypercholesterolemia, unspecified: Secondary | ICD-10-CM | POA: Diagnosis not present

## 2014-05-05 LAB — HEPATIC FUNCTION PANEL
ALBUMIN: 4 g/dL (ref 3.5–5.2)
ALT: 40 U/L (ref 0–53)
AST: 28 U/L (ref 0–37)
Alkaline Phosphatase: 44 U/L (ref 39–117)
BILIRUBIN TOTAL: 0.7 mg/dL (ref 0.2–1.2)
Bilirubin, Direct: 0.1 mg/dL (ref 0.0–0.3)
Total Protein: 6.8 g/dL (ref 6.0–8.3)

## 2014-05-05 LAB — LIPID PANEL
CHOL/HDL RATIO: 3
Cholesterol: 114 mg/dL (ref 0–200)
HDL: 36.5 mg/dL — ABNORMAL LOW (ref >39.00)
LDL Cholesterol: 55 mg/dL (ref 0–99)
NONHDL: 77.5
Triglycerides: 111 mg/dL (ref 0.0–149.0)
VLDL: 22.2 mg/dL (ref 0.0–40.0)

## 2014-05-06 ENCOUNTER — Other Ambulatory Visit: Payer: Self-pay | Admitting: Cardiology

## 2014-05-06 DIAGNOSIS — G5139 Clonic hemifacial spasm, unspecified: Secondary | ICD-10-CM | POA: Insufficient documentation

## 2014-05-06 DIAGNOSIS — G518 Other disorders of facial nerve: Secondary | ICD-10-CM | POA: Diagnosis not present

## 2014-05-06 DIAGNOSIS — E785 Hyperlipidemia, unspecified: Secondary | ICD-10-CM

## 2014-05-06 DIAGNOSIS — G450 Vertebro-basilar artery syndrome: Secondary | ICD-10-CM | POA: Diagnosis not present

## 2014-05-07 ENCOUNTER — Other Ambulatory Visit: Payer: Medicare Other

## 2014-05-07 ENCOUNTER — Ambulatory Visit (INDEPENDENT_AMBULATORY_CARE_PROVIDER_SITE_OTHER): Payer: Medicare Other | Admitting: Cardiology

## 2014-05-07 ENCOUNTER — Encounter: Payer: Self-pay | Admitting: Cardiology

## 2014-05-07 VITALS — BP 110/60 | HR 48 | Ht 68.0 in | Wt 195.0 lb

## 2014-05-07 DIAGNOSIS — Z7901 Long term (current) use of anticoagulants: Secondary | ICD-10-CM | POA: Diagnosis not present

## 2014-05-07 DIAGNOSIS — I4891 Unspecified atrial fibrillation: Secondary | ICD-10-CM | POA: Diagnosis not present

## 2014-05-07 DIAGNOSIS — G4733 Obstructive sleep apnea (adult) (pediatric): Secondary | ICD-10-CM

## 2014-05-07 DIAGNOSIS — I10 Essential (primary) hypertension: Secondary | ICD-10-CM | POA: Diagnosis not present

## 2014-05-07 MED ORDER — NEBIVOLOL HCL 5 MG PO TABS: ORAL_TABLET | ORAL | Status: DC

## 2014-05-07 NOTE — Patient Instructions (Signed)
Your physician recommends that you continue on your current medications as directed. Please refer to the Current Medication list given to you today.  Your physician wants you to follow-up in: 6 month. You will receive a reminder letter in the mail two months in advance. If you don't receive a letter, please call our office to schedule the follow-up appointment.  

## 2014-05-07 NOTE — Progress Notes (Signed)
[address], [place], [place]  [address] Phone: [phone] Fax:  [phone]  Date:  05/07/2014   ID:  Troy [name], DOB [birth year], MRN [id number]  PCP:  Lilian Coma, MD  Cardiologist:  Fransico Him, MD     History of Present Illness: Troy [name] is a 73 y.o. male with a history of ASCAD, HTN, OSA now using oral device, dyslipidemia and atrial fibrillation who presents today for followup. He denies any chest pain, SOB, DOE, LE edema, dizziness, palpitations or syncope. He tolerates the oral device for OSA. He feels rested when he gets up and has no daytime sleepiness if he sleeps well the night before. His main complaint is problems sleeping with his Parkinson's meds.     Wt Readings from Last 3 Encounters:  05/07/14 195 lb (88.451 kg)  11/10/13 197 lb (89.359 kg)  10/12/13 201 lb (91.173 kg)     Past Medical History  Diagnosis Date  . Constipation   . Diabetes mellitus   . Facial nerve spasm     L eye, tx'd with botox  . Fecal impaction   . Hypertension   . Hypothyroidism   . Hyperlipidemia   . Diverticulosis   . Gout   . CKD (chronic kidney disease) stage 2, GFR 60-89 ml/min   . OSA (obstructive sleep apnea)     intolerant to CPAP followed by Dr. Lamonte Sakai  . AAA (abdominal aortic aneurysm)     with mural thrombus  . Hepatic steatosis   . Coronary artery disease 07/2009    severe 3 vessel ASCAD s/p CABG with LIMA to LAD, SVG to diag, SVG to PL  . PAF (paroxysmal atrial fibrillation)   . Chronic anticoagulation     Current Outpatient Prescriptions  Medication Sig Dispense Refill  . amLODipine (NORVASC) 10 MG tablet take 1 tablet by mouth once daily  30 tablet  3  . aspirin EC 81 MG tablet Take 81 mg by mouth daily.      . carbidopa-levodopa (SINEMET CR) 50-200 MG per tablet Take 1 tablet by mouth 3 (three) times daily.      . carboxymethylcellulose (REFRESH PLUS) 0.5 % SOLN Place 1 drop into both eyes 3 (three) times daily as needed (dry  eyes).      . Cholecalciferol (VITAMIN D-3) 1000 UNITS CAPS Take 1,000 Units by mouth daily.      . Cinnamon 500 MG capsule Take 500 mg by mouth daily.       . clobetasol (TEMOVATE) 0.05 % external solution Apply 1 application topically 2 (two) times daily as needed (itching).       . DHA-EPA-Vitamin E 200-300-5 MG-MG-UNIT CAPS Take 1 capsule by mouth 2 (two) times daily as needed (dry eyes).       Marland Kitchen donepezil (ARICEPT) 10 MG tablet Take 10 mg by mouth at bedtime.      . entacapone (COMTAN) 200 MG tablet Take 200 mg by mouth 3 (three) times daily.      Marland Kitchen EPIPEN 2-PAK 0.3 MG/0.3ML SOAJ Inject 0.3 mg into the muscle once as needed. Allergic reaction      . fluticasone (FLONASE) 50 MCG/ACT nasal spray Place 2 sprays into the nose daily as needed for allergies.       Marland Kitchen GRAPE SEED EXTRACT PO Take 100 mg by mouth daily. OTC      . ketoconazole (NIZORAL) 2 % shampoo Apply 1 application topically 2 (two) times daily as needed for irritation.       Marland Kitchen  levothyroxine (SYNTHROID, LEVOTHROID) 125 MCG tablet Take 125 mcg by mouth daily.        Marland Kitchen lisinopril (PRINIVIL,ZESTRIL) 40 MG tablet Take 40 mg by mouth daily.        . nebivolol (BYSTOLIC) 5 MG tablet take 1 tablet by mouth once daily  30 tablet  1  . OVER THE COUNTER MEDICATION Take 1 tablet by mouth 2 (two) times daily. Macular degeneration supplement      . OVER THE COUNTER MEDICATION Take 1 capsule by mouth daily. Homocysteine supplement      . rosuvastatin (CRESTOR) 5 MG tablet Take 5 mg by mouth daily.      Marland Kitchen warfarin (COUMADIN) 5 MG tablet Take 5-7.5 mg by mouth daily. 7.5 mg on Tuesday and Friday and all other days is 5mg        No current facility-administered medications for this visit.    Allergies:    Allergies  Allergen Reactions  . Penicillins Rash  . Tetanus Toxoids Rash    Social History:  The patient  reports that he quit smoking about 5 years ago. His smoking use included Cigarettes. He smoked 0.00 packs per day for 15 years. He  has never used smokeless tobacco. He reports that he drinks about 1.2 ounces of alcohol per week. He reports that he does not use illicit drugs.   Family History:  The patient's family history includes Alzheimer's disease in his brother, father, mother, and paternal uncle; Heart disease in his mother.   ROS:  Please see the history of present illness.      All other systems reviewed and negative.   PHYSICAL EXAM: VS:  BP 110/60  Pulse 48  Ht 5\' 8"  (1.727 m)  Wt 195 lb (88.451 kg)  BMI 29.66 kg/m2 Well nourished, well developed, in no acute distress HEENT: normal Neck: no JVD Cardiac:  normal S1, S2; RRR; no murmur Lungs:  clear to auscultation bilaterally, no wheezing, rhonchi or rales Abd: soft, nontender, no hepatomegaly Ext: no edema Skin: warm and dry Neuro:  CNs 2-12 intact, no focal abnormalities noted  ASSESSMENT AND PLAN:  1. ASCAD with no angina - continue ASA  2. OSA using mouth appliance 3. Dyslipidemia - lipids are at goal - continue Crestor/fish oil  4. HTN - controlled - continue Lisinopril/Bystolic/amlodipine  5. Atrial fibrillation - maintaining sinus bradycardia - continue Bystolic/warfarin   Followup with me in 6 months       Signed, Fransico Him, MD 05/07/2014 8:58 AM

## 2014-05-20 DIAGNOSIS — D690 Allergic purpura: Secondary | ICD-10-CM | POA: Diagnosis not present

## 2014-05-20 DIAGNOSIS — L820 Inflamed seborrheic keratosis: Secondary | ICD-10-CM | POA: Diagnosis not present

## 2014-05-20 DIAGNOSIS — L819 Disorder of pigmentation, unspecified: Secondary | ICD-10-CM | POA: Diagnosis not present

## 2014-05-20 DIAGNOSIS — L821 Other seborrheic keratosis: Secondary | ICD-10-CM | POA: Diagnosis not present

## 2014-05-20 DIAGNOSIS — L408 Other psoriasis: Secondary | ICD-10-CM | POA: Diagnosis not present

## 2014-05-20 DIAGNOSIS — D1801 Hemangioma of skin and subcutaneous tissue: Secondary | ICD-10-CM | POA: Diagnosis not present

## 2014-05-20 DIAGNOSIS — D692 Other nonthrombocytopenic purpura: Secondary | ICD-10-CM | POA: Diagnosis not present

## 2014-05-20 DIAGNOSIS — Z85828 Personal history of other malignant neoplasm of skin: Secondary | ICD-10-CM | POA: Diagnosis not present

## 2014-05-21 ENCOUNTER — Ambulatory Visit (INDEPENDENT_AMBULATORY_CARE_PROVIDER_SITE_OTHER): Payer: Medicare Other | Admitting: Pharmacist

## 2014-05-21 DIAGNOSIS — I4891 Unspecified atrial fibrillation: Secondary | ICD-10-CM | POA: Diagnosis not present

## 2014-05-21 DIAGNOSIS — Z5181 Encounter for therapeutic drug level monitoring: Secondary | ICD-10-CM | POA: Diagnosis not present

## 2014-05-21 LAB — POCT INR: INR: 3.3

## 2014-06-14 ENCOUNTER — Ambulatory Visit (INDEPENDENT_AMBULATORY_CARE_PROVIDER_SITE_OTHER): Payer: Medicare Other | Admitting: Pharmacist

## 2014-06-14 DIAGNOSIS — I4891 Unspecified atrial fibrillation: Secondary | ICD-10-CM

## 2014-06-14 DIAGNOSIS — Z5181 Encounter for therapeutic drug level monitoring: Secondary | ICD-10-CM

## 2014-06-14 LAB — POCT INR: INR: 2.6

## 2014-07-01 ENCOUNTER — Other Ambulatory Visit: Payer: Self-pay

## 2014-07-01 MED ORDER — ROSUVASTATIN CALCIUM 5 MG PO TABS: 5.0000 mg | ORAL_TABLET | Freq: Every day | ORAL | Status: DC

## 2014-07-01 MED ORDER — NEBIVOLOL HCL 5 MG PO TABS: ORAL_TABLET | ORAL | Status: DC

## 2014-07-09 ENCOUNTER — Encounter: Payer: Self-pay | Admitting: Neurology

## 2014-07-09 ENCOUNTER — Ambulatory Visit (INDEPENDENT_AMBULATORY_CARE_PROVIDER_SITE_OTHER): Payer: Medicare Other | Admitting: Neurology

## 2014-07-09 VITALS — BP 112/73 | HR 53 | Ht 67.5 in | Wt 200.8 lb

## 2014-07-09 DIAGNOSIS — G3184 Mild cognitive impairment, so stated: Secondary | ICD-10-CM | POA: Diagnosis not present

## 2014-07-09 DIAGNOSIS — G20 Parkinson's disease: Secondary | ICD-10-CM

## 2014-07-09 DIAGNOSIS — F0390 Unspecified dementia without behavioral disturbance: Secondary | ICD-10-CM | POA: Insufficient documentation

## 2014-07-09 DIAGNOSIS — G20C Parkinsonism, unspecified: Secondary | ICD-10-CM | POA: Insufficient documentation

## 2014-07-09 MED ORDER — CLONAZEPAM 0.5 MG PO TABS: 0.5000 mg | ORAL_TABLET | Freq: Every day | ORAL | Status: DC

## 2014-07-09 NOTE — Patient Instructions (Signed)
1. Gradually taper off comtan - 200mg  twice a day for a week and then 100mg  (half tab) twice a day for a week and then 100mg  once a day for a week and then off 2. Try Clonazepam 0.5mg  at night for REM behavior during sleep. If drowsy or difficulty wake up in the morning, then discontinue. 3. Continue PT/OT is key for mobility training 4. Continue to follow up with botox injection and cardiologist.  5. Also will refer to Dr. Rexene Alberts for appointment next available for parkinsonian syndrome 6. Continue all other medications. 7. Avoid fall.

## 2014-07-09 NOTE — Progress Notes (Addendum)
NEUROLOGY CLINIC NEW PATIENT NOTE  NAME: Troy [name] DOB: [birth year]  I saw Troy [name] as a new patient in the neurovascular clinic today regarding  Chief Complaint  Patient presents with  . Parkinsonian syndrome    Np#1  .  HPI: Troy [name] is a 73 y.o. male with complicated past medical history including hypertension, hyperlipidemia, borderline diabetes, coronary artery disease s/p CABG 5 years ago, sleep apnea on mouth device, and atrial fibrillation on Coumadin. He also had left hemifacial spasm since 2003 in in 4 in with Dr. Maudry Mayhew for Botox injections. 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  He and his wife stated that about 5 years ago while he was following up with Dr. Maudry Mayhew for hemifacial spasm, they noticed that he had some trouble with gait and balance.  It took a while to diagnose with Parkinson disease because he did not have tremor or rigidity, but he did have bradykinesia and gait difficulty. He was treated with the Sinemet and currently on Sinemet ER 50/200 mg 3 times a day. He was also recently added Comtan for the last 3 months. As per wife there is never a dramatic effect from Sinemet or comtan. No significance fluctuating symptoms before and after medication. No freezing episodes. However his gait really improved up to taking medication, no more shuffling as before. Patient and wife complained that the only side effect from the medication is drowsiness and sleepiness after taking the medication. He has been working with your rehabilitation or balance and gait training. In recently he was enrolled in the program called [facility], and the patient fell this program helped him a lot, not much stumbling as before, working more straight-line, less shuffling, and more balance of gait. His last UPDRS was 16 in 11/2013 [facility]. He had MRI brain scan  last year showing brain atrophy and small vessel right matter changes. 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 He also had LP done in January this year did not show evidence of NPH, in the CSF was clean. At [facility], he was considered not Parkinson disease but more consistent with vascular parkinsonism.  Associated with the Parkinson disease diagnosis, he also has speech difficulty. He said almost all the time, he had been verified the right words, and sometimes he needs hint to continue the sentence. This has been going on for a while and worsening for last six months. He also has REM sleep behavior, frequent shouting, kicking and fighting during sleep. This has been going on for about one and half years and worsened for the last 6 months too. He denies any depression, any frequent falls.  He also has a diagnosis of mild cognitive impairment, patient stated that he had difficulty remember names remember appointments remember taking medications, he had to write down everything to help him remember. He is able to do all his ADLs, no accident related to memory problem, not driving at this moment. His last MOCA was 20/30 at [facility]. He had a strong family history of Alzheimer's disease which running in his mom, brother, and uncle.  He is hemifacial spasm since 2003, having follow with Dr. Maudry Mayhew for long time for Botox injections. He was also referred to Dr. Redmond Pulling in [facility] for consideration of microvascular surgery. However Dr. Redmond Pulling consider it was high risk surgery  and currently patient still going on for Botox injections for conservative management. The last. Injection was in May.  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  Her history of sleep apnea, not able to tolerate CPAP machine, he is on oral device currently. However, due to his REM  sleep behavior, he had broken the oral device once and currently is using the second device.  He also had a history of hypertension, on lisinopril and amlodipine. He also had a history of hyperlipidemia, currently on Crestor. He also had a history of CAD with multivessel blockage, s/p CABG 5 years ago, currently on aspirin 81 mg daily. He was also found to have atrial fibrillation before the CABG surgery, he is currently followed by cardiologist and the currently on Coumadin and bystolic. He also had a cosmetic history of  AAA, asymptomatic, s/p stenting.    Outside reports reviewed: imaging reports - MRI, EMG, LP results and office notes.  Past Medical History  Diagnosis Date  . Constipation   . Diabetes mellitus   . Facial nerve spasm     L eye, tx'd with botox  . Fecal impaction   . Hypertension   . Hypothyroidism   . Hyperlipidemia   . Diverticulosis   . Gout   . CKD (chronic kidney disease) stage 2, GFR 60-89 ml/min   . OSA (obstructive sleep apnea)     intolerant to CPAP followed by Dr. Lamonte Sakai  . AAA (abdominal aortic aneurysm)     with mural thrombus  . Hepatic steatosis   . Coronary artery disease 07/2009    severe 3 vessel ASCAD s/p CABG with LIMA to LAD, SVG to diag, SVG to PL  . PAF (paroxysmal atrial fibrillation)   . Chronic anticoagulation    Past Surgical History  Procedure Laterality Date  . Coronary artery bypass graft  07-2009  . Spine surgery  1998  . Eye surgery Left May  2013    Cataract  . Eye surgery Right June 2013    Cataract  . Abdominal aortic aneurysm repair  01-2010    aorto-bi-iliac Gore Excluder stent graft   Family History  Problem Relation Age of Onset  . Heart disease Mother     CHF  . Alzheimer's disease Mother   . Alzheimer's disease Father   . Alzheimer's disease Brother   . Alzheimer's disease Paternal Uncle    Current Outpatient Prescriptions  Medication Sig Dispense Refill  . amLODipine (NORVASC) 10 MG tablet take 1 tablet by  mouth once daily  30 tablet  3  . aspirin EC 81 MG tablet Take 81 mg by mouth daily.      . carbidopa-levodopa (SINEMET CR) 50-200 MG per tablet Take 1 tablet by mouth 3 (three) times daily.      . carboxymethylcellulose (REFRESH PLUS) 0.5 % SOLN Place 1 drop into both eyes 3 (three) times daily as needed (dry eyes).      . Cholecalciferol (VITAMIN D-3) 1000 UNITS CAPS Take 1,000 Units by mouth daily.      . Cinnamon 500 MG capsule Take 500 mg by mouth daily.       . clobetasol (TEMOVATE) 0.05 % external solution Apply 1 application topically 2 (two) times daily as needed (itching).       . DHA-EPA-Vitamin E 200-300-5 MG-MG-UNIT CAPS Take 1 capsule by mouth 2 (two) times daily as needed (  dry eyes).       Marland Kitchen donepezil (ARICEPT) 10 MG tablet Take 10 mg by mouth at bedtime.      . entacapone (COMTAN) 200 MG tablet Take 200 mg by mouth 3 (three) times daily.      Marland Kitchen EPIPEN 2-PAK 0.3 MG/0.3ML SOAJ Inject 0.3 mg into the muscle once as needed. Allergic reaction      . fluticasone (FLONASE) 50 MCG/ACT nasal spray Place 2 sprays into the nose daily as needed for allergies.       Marland Kitchen GRAPE SEED EXTRACT PO Take 100 mg by mouth daily. OTC      . ketoconazole (NIZORAL) 2 % shampoo Apply 1 application topically 2 (two) times daily as needed for irritation.       Marland Kitchen levothyroxine (SYNTHROID, LEVOTHROID) 125 MCG tablet Take 125 mcg by mouth daily.        Marland Kitchen lisinopril (PRINIVIL,ZESTRIL) 40 MG tablet Take 40 mg by mouth daily.        . nebivolol (BYSTOLIC) 5 MG tablet take 1 tablet by mouth once daily  30 tablet  5  . OVER THE COUNTER MEDICATION Take 1 tablet by mouth 2 (two) times daily. Macular degeneration supplement      . OVER THE COUNTER MEDICATION Take 1 capsule by mouth daily. Homocysteine supplement      . rosuvastatin (CRESTOR) 5 MG tablet Take 1 tablet (5 mg total) by mouth daily.  30 tablet  6  . warfarin (COUMADIN) 5 MG tablet Take 5-7.5 mg by mouth daily. 7.5 mg on Tuesday and Friday and all other days  is 5mg       . clindamycin (CLEOCIN) 150 MG capsule Take 150 mg by mouth as needed.      . clonazePAM (KLONOPIN) 0.5 MG tablet Take 1 tablet (0.5 mg total) by mouth at bedtime.  30 tablet  0   No current facility-administered medications for this visit.   Allergies  Allergen Reactions  . Penicillins Rash  . Tetanus Toxoids Rash   History   Social History  . Marital Status: Married    Spouse Name: N/A    Number of Children: 3  . Years of Education: BS   Occupational History  . retired    Social History Main Topics  . Smoking status: Former Smoker -- 15 years    Types: Cigarettes    Quit date: 10/07/2008  . Smokeless tobacco: Never Used  . Alcohol Use: 1.2 oz/week    2 Glasses of wine per week     Comment: OCC.  . Drug Use: No  . Sexual Activity: No   Other Topics Concern  . Not on file   Social History Narrative   Patient lives at home with his wife   Patient is right handed   Patient drinks coffee    Review of Systems Full 14 system review of systems performed and notable only for those listed, all others are neg:  Constitutional: N/A  Cardiovascular: N/A  Ear/Nose/Throat: N/A  Skin: N/A  Eyes: N/A  Respiratory: N/A  Gastroitestinal: N/A  Hematology/Lymphatic: N/A  Endocrine: N/A  Musculoskeletal: N/A  Allergy/Immunology: N/A  Neurological: N/A  Psychiatric: N/A   Physical Exam  Filed Vitals:   07/09/14 0829  BP: 112/73  Pulse: 53    General - Well nourished, well developed, in no apparent distress.  Ophthalmologic - not able to see through.  Cardiovascular - Regular rate and rhythm with no murmur. Carotid pulses were 2+ without bruits.  Neck - supple, no nuchal rigidity.  Mental Status -  MOCA - Total - 17/30 visuospatial - executive 3/5 Naming - 3/3 Memory -  Attention - 2/2, 1/1, 1/3 Language - 2/2, 1/1 Abstraction - 0/2 Delayed recall - 0/5 Orientation - 4/6  Cranial Nerves II - XII - II - Visual field intact OU. III, IV, VI  - Extraocular movements intact. V - Facial sensation intact bilaterally. VII - Facial movement showed left mild upper and lower facial weakness, most likely associated with botox injection. There was frequent left facial twitching during visit. VIII - mild Hearing loss & vestibular intact bilaterally. X - Palate elevates symmetrically. XI - Chin turning & shoulder shrug intact bilaterally. XII - Tongue protrusion intact.  Motor Strength - The patient's strength was normal in all extremities and pronator drift was absent.  Bulk was normal and fasciculations were absent.   Motor Tone - Muscle tone was assessed at the neck and appendages and was normal.  Reflexes - The patient's reflexes were decreased in all extremities and he had no pathological reflexes.  Sensory - Light touch, temperature/pinprick were assessed and were normal.    Coordination - The patient had normal movements in the hands and feet with no ataxia or dysmetria.  Tremor was absent.  Gait and Station - see UPDRS.  UPDRS part III - Motor Examination  1. Speech 1 2. Facial Expression 2 3. Tremor at rest 0 4. Action or Postural Tremor of hands 0 5. Rigidity 0 6. Finger Taps 0 7. Hand Movements 0 8. Rapid Movements of Hands 1/1 9. Leg Agility 1/1  10. Arising from Chair 1 11. Posture 1 12. Gait 1 13. Postural Stability 2 14. Body Bradykinesia and Hypokinesia 1  Total 13  Imaging MRI reports 08/07/2013 - no images available 1. no acute intracranial pathology follow space although pain mass, acute ischemia, or pathological enhancement is demonstrated. 2. age related more in loss with asymmetrical left colpocephaly likely secondary to periventricular gliosis/volume loss.  EMG 11/06/2013 Conclusion: This is an abnormal study. 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  Lab Review 12/07/2013 - WBC 8.5, hemoglobin 15.5, RBC 5.29, platelet 165. CSF  glucose 90 protein 75 , tube 1 WBC 0 RBC 22, tube 4 WBC 2 RBC 0   Assessment and Plan:   In summary, Troy [name] is a 73 y.o. 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 She has parkinsonism for the last 5 years, currently on Sinemet ER 50/200 3 times a day with Comtan 200mg  3 times a day. Patient and the wife stated the Sinemet really helps for the gait, but also makes him sleepy during the day. Comtan seems to not have any at the defect, will gradually taper off. His UPDRS was 13 today. Patient and wife has been followed up movement disorder center [facility] in the past so they wanted to continue followup with the moment disorder specialist in our clinic.  He also had REM behavior disorder, and the wife is quite bothered about this disorder at night. We'll start her last and low dose clonazepam trial at night.  He also had mild cognitive impairment, a smoker score 17/30 today. We'll continue Aricept.  - taper off comtan gradually  as schedule listed below - clonazepam 0.5mg  trial at night for REM behavior - continue PT/OT - will refer to Dr. Rexene Alberts, the movement disorder specialist for continued care. - continue to follow up with Dr. Maudry Mayhew for botox for hemifacial spasm. - continue to follow up with cardiologist for afib, HTN and CAD. Continue coumadin and crestor for stroke prevention - avoid fall  Thank you very much for the opportunity to participate in the care of this patient.  Please do not hesitate to call if any questions or concerns arise.  Patient Instructions  1. Gradually taper off comtan - 200mg  twice a day for a week and then 100mg  (half tab) twice a day for a week and then 100mg  once a day for a week and then off 2. Try  Clonazepam 0.5mg  at night for REM behavior during sleep. If drowsy or difficulty wake up in the morning, then discontinue. 3. Continue PT/OT is key for mobility training 4. Continue to follow up with botox injection and cardiologist.  5. Also will refer to Dr. Rexene Alberts for appointment next available for parkinsonian syndrome 6. Continue all other medications. 7. Avoid fall.   Rosalin Hawking, MD PhD [facility] [address], [place], [place] [address] [phone]  Agree with above.  Star Age, MD, PhD [facility])

## 2014-07-12 ENCOUNTER — Ambulatory Visit (INDEPENDENT_AMBULATORY_CARE_PROVIDER_SITE_OTHER): Payer: Medicare Other | Admitting: Pharmacist

## 2014-07-12 DIAGNOSIS — Z5181 Encounter for therapeutic drug level monitoring: Secondary | ICD-10-CM

## 2014-07-12 DIAGNOSIS — I4891 Unspecified atrial fibrillation: Secondary | ICD-10-CM

## 2014-07-12 LAB — POCT INR: INR: 2.4

## 2014-07-19 DIAGNOSIS — G518 Other disorders of facial nerve: Secondary | ICD-10-CM | POA: Diagnosis not present

## 2014-07-19 DIAGNOSIS — R6889 Other general symptoms and signs: Secondary | ICD-10-CM | POA: Diagnosis not present

## 2014-07-28 ENCOUNTER — Other Ambulatory Visit: Payer: Self-pay | Admitting: Neurology

## 2014-07-29 ENCOUNTER — Other Ambulatory Visit: Payer: Self-pay | Admitting: *Deleted

## 2014-07-29 MED ORDER — WARFARIN SODIUM 5 MG PO TABS: 5.0000 mg | ORAL_TABLET | ORAL | Status: DC

## 2014-08-03 ENCOUNTER — Telehealth: Payer: Self-pay | Admitting: Neurology

## 2014-08-03 NOTE — Telephone Encounter (Signed)
Patient spouse stated prior authorization is needed for clonazePAM (KLONOPIN) 0.5 MG tablet.  Please call anytime and may leave detailed message on voicemail if not available.

## 2014-08-03 NOTE — Telephone Encounter (Signed)
I called the pharmacy.  Spoke with April who transferred me to [place].  She said no prior auth was needed.  States Rx was filled on 08/14, so it is refill too soon.  I called the patient back.  Explained Rx was a bit too soon.  They verbalized understanding, and indicate nothing further is needed at this time.

## 2014-08-06 ENCOUNTER — Other Ambulatory Visit: Payer: Self-pay | Admitting: Neurology

## 2014-08-06 ENCOUNTER — Other Ambulatory Visit: Payer: Self-pay

## 2014-08-06 DIAGNOSIS — G20 Parkinson's disease: Secondary | ICD-10-CM

## 2014-08-08 MED ORDER — CLONAZEPAM 0.5 MG PO TABS: 0.5000 mg | ORAL_TABLET | Freq: Every day | ORAL | Status: DC

## 2014-08-09 ENCOUNTER — Ambulatory Visit (INDEPENDENT_AMBULATORY_CARE_PROVIDER_SITE_OTHER): Payer: Medicare Other | Admitting: Pharmacist

## 2014-08-09 DIAGNOSIS — Z5181 Encounter for therapeutic drug level monitoring: Secondary | ICD-10-CM

## 2014-08-09 DIAGNOSIS — I4891 Unspecified atrial fibrillation: Secondary | ICD-10-CM

## 2014-08-09 LAB — POCT INR: INR: 2.6

## 2014-08-11 DIAGNOSIS — Z23 Encounter for immunization: Secondary | ICD-10-CM | POA: Diagnosis not present

## 2014-08-13 DIAGNOSIS — H26499 Other secondary cataract, unspecified eye: Secondary | ICD-10-CM | POA: Diagnosis not present

## 2014-08-13 DIAGNOSIS — H40019 Open angle with borderline findings, low risk, unspecified eye: Secondary | ICD-10-CM | POA: Diagnosis not present

## 2014-08-13 DIAGNOSIS — H353190 Nonexudative age-related macular degeneration, unspecified eye, stage unspecified: Secondary | ICD-10-CM | POA: Diagnosis not present

## 2014-08-13 DIAGNOSIS — Z961 Presence of intraocular lens: Secondary | ICD-10-CM | POA: Diagnosis not present

## 2014-08-18 ENCOUNTER — Other Ambulatory Visit: Payer: Self-pay

## 2014-08-18 ENCOUNTER — Other Ambulatory Visit: Payer: Self-pay | Admitting: Cardiology

## 2014-08-18 ENCOUNTER — Other Ambulatory Visit: Payer: Self-pay | Admitting: *Deleted

## 2014-08-18 MED ORDER — LISINOPRIL 40 MG PO TABS: 40.0000 mg | ORAL_TABLET | Freq: Every day | ORAL | Status: DC

## 2014-08-18 MED ORDER — AMLODIPINE BESYLATE 10 MG PO TABS: ORAL_TABLET | ORAL | Status: DC

## 2014-08-18 NOTE — Telephone Encounter (Signed)
Did you refill the 10 MG?

## 2014-08-18 NOTE — Telephone Encounter (Signed)
We have pt on 10 MG

## 2014-08-18 NOTE — Telephone Encounter (Signed)
Should this be 5mg  or 10mg ? Please advise. Thanks, MI

## 2014-08-23 ENCOUNTER — Ambulatory Visit (INDEPENDENT_AMBULATORY_CARE_PROVIDER_SITE_OTHER): Payer: Medicare Other | Admitting: Neurology

## 2014-08-23 ENCOUNTER — Encounter: Payer: Self-pay | Admitting: Neurology

## 2014-08-23 VITALS — BP 113/74 | HR 54 | Temp 98.0°F | Ht 67.5 in | Wt 198.0 lb

## 2014-08-23 DIAGNOSIS — G20 Parkinson's disease: Secondary | ICD-10-CM | POA: Diagnosis not present

## 2014-08-23 DIAGNOSIS — G20C Parkinsonism, unspecified: Secondary | ICD-10-CM

## 2014-08-23 DIAGNOSIS — G4752 REM sleep behavior disorder: Secondary | ICD-10-CM

## 2014-08-23 MED ORDER — CLONAZEPAM 0.5 MG PO TABS: 0.5000 mg | ORAL_TABLET | Freq: Every day | ORAL | Status: DC

## 2014-08-23 MED ORDER — ENTACAPONE 200 MG PO TABS: 200.0000 mg | ORAL_TABLET | Freq: Three times a day (TID) | ORAL | Status: DC

## 2014-08-23 MED ORDER — CARBIDOPA-LEVODOPA ER 50-200 MG PO TBCR: 1.0000 | EXTENDED_RELEASE_TABLET | Freq: Three times a day (TID) | ORAL | Status: DC

## 2014-08-23 NOTE — Patient Instructions (Signed)
I think your Parkinson's disease has remained fairly stable, which is reassuring. Nevertheless, as you know, this disease does progress with time. It can affect your balance, your memory, your mood, your bowel and bladder function, your posture, balance and walking. Overall you are doing fairly well but I do want to suggest a few things today:  Remember to drink plenty of fluid, eat healthy meals and do not skip any meals. Try to eat protein with a every meal and eat a healthy snack such as fruit or nuts in between meals. Try to keep a regular sleep-wake schedule and try to exercise daily, particularly in the form of walking, 20-30 minutes a day, if you can.   Taking your medication on schedule is key.   Try to stay active physically and mentally. Engage in social activities in your community and with your family and try to keep up with current events by reading the newspaper or watching the news. Try to do word puzzles and you may like to do word puzzles and brain games on the computer such as on [email or web address]   As far as your medications are concerned, I would like to suggest that you take your current medication with the following additional changes: no changes.    As far as diagnostic testing, I will order: no new test needed.   I would like to see you back in 6 months, sooner if we need to. Please call us with any interim questions, concerns, problems, updates or refill requests.  Our phone number is [phone]. We also have an after hours call service for urgent matters and there is a physician on-call for urgent questions, that cannot wait till the next work day. For any emergencies you know to call 911 or go to the nearest emergency room.

## 2014-08-23 NOTE — Progress Notes (Signed)
Subjective:    Patient ID: Troy [name] is a 73 y.o. male.  HPI    HPI:   Dear Cornelius Moras,   I saw your patient, Troy [name], upon your kind request in my clinic today for initial consultation of his parkinsonism. The patient is accompanied by his wife today. 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 Maudry Mayhew, who was diagnosed with parkinsonism about 5 years ago. He was placed on Sinemet and continues to take Sinemet ER 50-200 mg strength 3 times a day. Comtan was added recently about 4 months ago. 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 Maudry Mayhew for hemifacial spasm. Previous workup included MRI brain last year showing brain atrophy and small vessel white matter changes. 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 He also had LP done in January 2015 which did not show evidence of NPH, and the CSF was clean.  He noted more difficulty with his gait after stopping the comtan, so he re-started it. His sleep is better. He has had some memory loss. He has been on Aricept for about 2 years and memory has been stable. He has no VH, or AH. He has no mood issues.    His Past Medical History Is Significant For: Past Medical History  Diagnosis Date  . Constipation   . Diabetes mellitus    . Facial nerve spasm     L eye, tx'd with botox  . Fecal impaction   . Hypertension   . Hypothyroidism   . Hyperlipidemia   . Diverticulosis   . Gout   . CKD (chronic kidney disease) stage 2, GFR 60-89 ml/min   . OSA (obstructive sleep apnea)     intolerant to CPAP followed by Dr. Lamonte Sakai  . AAA (abdominal aortic aneurysm)     with mural thrombus  . Hepatic steatosis   . Coronary artery disease 07/2009    severe 3 vessel ASCAD s/p CABG with LIMA to LAD, SVG to diag, SVG to PL  . PAF (paroxysmal atrial fibrillation)   . Chronic anticoagulation     His Past Surgical History Is Significant For: Past Surgical History  Procedure Laterality Date  . Coronary artery bypass graft  07-2009  . Spine surgery  1998  . Eye surgery Left May  2013    Cataract  . Eye surgery Right June 2013    Cataract  . Abdominal aortic aneurysm repair  01-2010    aorto-bi-iliac Gore Excluder stent graft    His Family History Is Significant For: Family History  Problem Relation Age of Onset  . Heart disease Mother     CHF  . Alzheimer's disease Mother   . Alzheimer's disease Father   . Alzheimer's disease Brother   . Alzheimer's disease Paternal Uncle     His Social History Is Significant For: History   Social History  . Marital Status: Married    Spouse Name: N/A    Number  of Children: 3  . Years of Education: BS   Occupational History  . retired    Social History Main Topics  . Smoking status: Former Smoker -- 15 years    Types: Cigarettes    Quit date: 10/07/2008  . Smokeless tobacco: Never Used  . Alcohol Use: 1.2 oz/week    2 Glasses of wine per week     Comment: OCC.  . Drug Use: No  . Sexual Activity: No   Other Topics Concern  . None   Social History Narrative   Patient lives at home with his wife   Patient is right handed   Patient drinks coffee    His Allergies Are:  Allergies  Allergen Reactions  . Penicillins Rash  . Tetanus Toxoids Rash  :   His Current  Medications Are:  Outpatient Encounter Prescriptions as of 08/23/2014  Medication Sig  . amLODipine (NORVASC) 10 MG tablet take 1 tablet by mouth once daily  . aspirin EC 81 MG tablet Take 81 mg by mouth daily.  Marland Kitchen lisinopril (PRINIVIL,ZESTRIL) 40 MG tablet Take 1 tablet (40 mg total) by mouth daily.  . carbidopa-levodopa (SINEMET CR) 50-200 MG per tablet Take 1 tablet by mouth 3 (three) times daily.  . carboxymethylcellulose (REFRESH PLUS) 0.5 % SOLN Place 1 drop into both eyes 3 (three) times daily as needed (dry eyes).  . Cholecalciferol (VITAMIN D-3) 1000 UNITS CAPS Take 1,000 Units by mouth daily.  . Cinnamon 500 MG capsule Take 500 mg by mouth daily.   . clindamycin (CLEOCIN) 150 MG capsule Take 150 mg by mouth as needed.  . clobetasol (TEMOVATE) 0.05 % external solution Apply 1 application topically 2 (two) times daily as needed (itching).   . clonazePAM (KLONOPIN) 0.5 MG tablet Take 1 tablet (0.5 mg total) by mouth at bedtime.  . DHA-EPA-Vitamin E 200-300-5 MG-MG-UNIT CAPS Take 1 capsule by mouth 2 (two) times daily as needed (dry eyes).   Marland Kitchen donepezil (ARICEPT) 10 MG tablet Take 10 mg by mouth at bedtime.  . entacapone (COMTAN) 200 MG tablet Take 200 mg by mouth 3 (three) times daily.  Marland Kitchen EPIPEN 2-PAK 0.3 MG/0.3ML SOAJ Inject 0.3 mg into the muscle once as needed. Allergic reaction  . fluticasone (FLONASE) 50 MCG/ACT nasal spray Place 2 sprays into the nose daily as needed for allergies.   Marland Kitchen GRAPE SEED EXTRACT PO Take 100 mg by mouth daily. OTC  . ketoconazole (NIZORAL) 2 % shampoo Apply 1 application topically 2 (two) times daily as needed for irritation.   Marland Kitchen levothyroxine (SYNTHROID, LEVOTHROID) 125 MCG tablet Take 125 mcg by mouth daily.    . nebivolol (BYSTOLIC) 5 MG tablet take 1 tablet by mouth once daily  . OVER THE COUNTER MEDICATION Take 1 tablet by mouth 2 (two) times daily. Macular degeneration supplement  . OVER THE COUNTER MEDICATION Take 1 capsule by mouth daily.  Homocysteine supplement  . rosuvastatin (CRESTOR) 5 MG tablet Take 1 tablet (5 mg total) by mouth daily.  Marland Kitchen warfarin (COUMADIN) 5 MG tablet Take 1 tablet (5 mg total) by mouth as directed. Take As Directed  :  Review of Systems:  Out of a complete 14 point review of systems, all are reviewed and negative with the exception of these symptoms as listed below:   Review of Systems  HENT: Positive for drooling.        Running nose  Musculoskeletal: Positive for gait problem.  Neurological:       Facial drooping, speech  difficulty, memory loss, restless leg, insomnia, apnea, daytime sleepiness, snoring, sleep talking, acting out dreams  Hematological: Bruises/bleeds easily.    Objective:  Neurologic Exam  Physical Exam Physical Examination:   Filed Vitals:   08/23/14 1111  BP: 113/74  Pulse: 54  Temp: 98 F (36.7 C)    General Examination: The patient is a very pleasant 73 y.o. male in no acute distress.  HEENT: Normocephalic, atraumatic, pupils are equal, round and reactive to light and accommodation. Funduscopic exam is normal with sharp disc margins noted. Extraocular tracking shows very mild saccadic breakdown without nystagmus noted. There is limitation to upper gaze. There is a very mild decrease in eye blink rate. Hearing is impaired mildly. Tympanic membranes are clear bilaterally. 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 There is no lip, neck or jaw tremor. Neck is minimally rigid with intact passive ROM. There are no carotid bruits on auscultation. Oropharynx exam reveals moderate mouth dryness. No significant airway crowding is noted. Mallampati is class II. Tongue protrudes centrally and palate elevates symmetrically.   There is mild drooling.   Chest: is clear to auscultation without wheezing, rhonchi or crackles noted.  Heart: sounds are regular and normal without  murmurs, rubs or gallops noted.   Abdomen: is soft, non-tender and non-distended with normal bowel sounds appreciated on auscultation.  Extremities: There is trace pitting edema in the distal lower extremities bilaterally. Pedal pulses are intact. Chronic stasis-like changes are noted in the distal legs bilaterally. There are no varicose veins.  Skin: is warm and dry with no trophic changes noted. Age-related changes are noted on the skin.   Musculoskeletal: exam reveals no obvious joint deformities, tenderness, joint swelling or erythema.  Neurologically:  Mental status: The patient is awake and alert, paying good  attention. He is able to partially provide the history. His wife provides most of the history. He is oriented to: person, place, situation, day of week, month of year and year. His memory, attention, language and knowledge are impaired mildly. There is no aphasia, agnosia, apraxia or anomia. There is a mild degree of bradyphrenia. Speech is mildly hypophonic with minimal dysarthria noted. Mood is congruent and affect is normal.  Cranial nerves are as described above under HEENT exam. In addition, shoulder shrug is normal with equal shoulder height noted.  Motor exam: Normal bulk, and strength for age is noted. There are no dyskinesias noted. Tone is mildly rigid with absence of cogwheeling in the extremities. There is overall mild bradykinesia. There is no drift or rebound.  There is no tremor.   Romberg is negative.  Reflexes are 1+ in the upper extremities and 1+ in the lower extremities. Toes are downgoing bilaterally.  Fine motor skills exam: Finger taps are mildly impaired on the right and mildly impaired on the left. Hand movements are mildly impaired on the right and mildly impaired on the left. RAP (rapid alternating patting) is mildly impaired on the right and mildly impaired on the left. Foot taps are mildly impaired on the right and mildly impaired on the left. Foot agility  (in the form of heel stomping) is moderately impaired on the right and moderately impaired on the left.    Cerebellar testing shows no dysmetria or intention tremor on finger to nose testing. Heel to shin is unremarkable bilaterally. There is no truncal or gait ataxia.   Sensory exam is intact to light  touch, pinprick, vibration, temperature sense and proprioception in the upper and lower extremities.   Gait, station and balance: He stands up from the seated position with mild difficulty and does need to push up with His hands. He needs no assistance. No veering to one side is noted. He is not noted to lean to the side. Posture is moderately stooped. No freezing is noted. Stance is narrow-based. He walks with decrease in stride length and pace and decreased arm swing on both sides. He has trouble turning. Balance is mildly impaired.   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 Maudry Mayhew, who was diagnosed with parkinsonism about 5 years ago. His history and exam is in keeping with parkinsonism, but the lack of obvious lateralization makes it less likely to be idiopathic PD as opposed to atypical parkinsonism. Nevertheless, he and his wife report improvement with levodopa therapy. He also noted increased freezing and problems with his walking after stopping Comtan. At this juncture, I've advised him to continue with the current medication regimen. He felt benefit with clonazepam. I  renewed his prescriptions today and suggested a six-month followup. He's had extensive workup at [facility] in the past.  I answered all their questions today and the patient and his wife were in agreement with the above outlined plan.  Thank you very much for allowing me to participate in the care of this nice patient. If I can be of any further assistance to you please do not hesitate to call me at [phone].  Sincerely,   Star Age, MD, PhD

## 2014-09-02 ENCOUNTER — Encounter: Payer: Self-pay | Admitting: Cardiology

## 2014-09-08 ENCOUNTER — Other Ambulatory Visit: Payer: Self-pay | Admitting: Cardiology

## 2014-09-21 ENCOUNTER — Ambulatory Visit (INDEPENDENT_AMBULATORY_CARE_PROVIDER_SITE_OTHER): Payer: Medicare Other | Admitting: Pharmacist Clinician (PhC)/ Clinical Pharmacy Specialist

## 2014-09-21 DIAGNOSIS — I4891 Unspecified atrial fibrillation: Secondary | ICD-10-CM | POA: Diagnosis not present

## 2014-09-21 DIAGNOSIS — Z5181 Encounter for therapeutic drug level monitoring: Secondary | ICD-10-CM | POA: Diagnosis not present

## 2014-09-21 LAB — POCT INR: INR: 3.3

## 2014-10-11 DIAGNOSIS — G518 Other disorders of facial nerve: Secondary | ICD-10-CM | POA: Diagnosis not present

## 2014-10-11 DIAGNOSIS — G513 Clonic hemifacial spasm: Secondary | ICD-10-CM | POA: Diagnosis not present

## 2014-10-11 DIAGNOSIS — R6889 Other general symptoms and signs: Secondary | ICD-10-CM | POA: Diagnosis not present

## 2014-10-12 ENCOUNTER — Ambulatory Visit (INDEPENDENT_AMBULATORY_CARE_PROVIDER_SITE_OTHER): Payer: Medicare Other

## 2014-10-12 DIAGNOSIS — I4891 Unspecified atrial fibrillation: Secondary | ICD-10-CM

## 2014-10-12 DIAGNOSIS — Z5181 Encounter for therapeutic drug level monitoring: Secondary | ICD-10-CM

## 2014-10-12 LAB — POCT INR: INR: 3

## 2014-10-19 ENCOUNTER — Ambulatory Visit: Payer: Medicare Other

## 2014-10-19 ENCOUNTER — Ambulatory Visit: Payer: Medicare Other | Attending: Psychiatry | Admitting: Physical Therapy

## 2014-10-19 ENCOUNTER — Ambulatory Visit: Payer: Medicare Other | Admitting: Occupational Therapy

## 2014-10-19 DIAGNOSIS — R269 Unspecified abnormalities of gait and mobility: Secondary | ICD-10-CM

## 2014-10-19 DIAGNOSIS — R258 Other abnormal involuntary movements: Secondary | ICD-10-CM

## 2014-10-19 DIAGNOSIS — R279 Unspecified lack of coordination: Secondary | ICD-10-CM

## 2014-10-19 DIAGNOSIS — R471 Dysarthria and anarthria: Secondary | ICD-10-CM

## 2014-10-19 NOTE — Therapy (Deleted)
Occupational Therapy Treatment  Patient Details  Name: Troy [name] MRN: [id number] Date of Birth: 1941-07-22  Encounter Date: 10/19/2014    Past Medical History  Diagnosis Date  . Constipation   . Diabetes mellitus   . Facial nerve spasm     L eye, tx'd with botox  . Fecal impaction   . Hypertension   . Hypothyroidism   . Hyperlipidemia   . Diverticulosis   . Gout   . CKD (chronic kidney disease) stage 2, GFR 60-89 ml/min   . OSA (obstructive sleep apnea)     intolerant to CPAP followed by Dr. Lamonte Sakai  . AAA (abdominal aortic aneurysm)     with mural thrombus  . Hepatic steatosis   . Coronary artery disease 07/2009    severe 3 vessel ASCAD s/p CABG with LIMA to LAD, SVG to diag, SVG to PL  . PAF (paroxysmal atrial fibrillation)   . Chronic anticoagulation     Past Surgical History  Procedure Laterality Date  . Coronary artery bypass graft  07-2009  . Spine surgery  1998  . Eye surgery Left May  2013    Cataract  . Eye surgery Right June 2013    Cataract  . Abdominal aortic aneurysm repair  01-2010    aorto-bi-iliac Gore Excluder stent graft    There were no vitals taken for this visit.  Visit Diagnosis:  No diagnosis found.                  Problem List Patient Active Problem List   Diagnosis Date Noted  . Parkinsonism 07/09/2014  . Mild cognitive impairment 07/09/2014  . Encounter for therapeutic drug monitoring 12/17/2013  . 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 Hypertension   . Hyperlipidemia   . CKD (chronic kidney disease) stage 2, GFR 60-89 ml/min   . OSA (obstructive sleep apnea)   . Hepatic steatosis   . Chronic anticoagulation   . Atrial fibrillation 08/28/2013  . AAA (abdominal aortic aneurysm) without rupture 04/14/2013  . Aftercare following surgery of the circulatory system, [place] 04/14/2013  . Coronary artery disease 07/27/2009                      Occupational Therapy Parkinson's  Disease Screen  Physical Performance Test item #2 (simulated eating):  12.53sec   Physical Performance Test item #4 (donning/doffing jacket):  21.34sec  9-hole peg test:    RUE  29.97sec        LUE  38.56sec  Box & Blocks Test:   RUE  35blocks        LUE  36blocks  Change in ability to perform ADLs/IADLs:  ***  Other Comments:  Pt reports difficulty with writing with decreased legibility.  Pt/wife report cognitive changes.  Pt would benefit from occupational therapy evaluation due to  ***  Pt does not require occupation therapy services at this time.  Recommended occupational therapy screen in   ***                   [facility] 10/19/2014, 1:52 PM

## 2014-10-19 NOTE — Therapy (Signed)
  Patient Details  Name: Troy [name] MRN: [id number] Date of Birth: Jul 03, 1941  Encounter Date: 10/19/2014  Physical Therapy Parkinson's Disease Screen   Timed Up and Go test:  15.96 sec  10 meter walk test:  2.79 f/tsec 5 time sit to stand test:  12.77 sec   Patient and family report pt is aware of his walking, appears safe/has had no falls.  He is working out with trainer several times per week.  Patient does not require Physical Therapy services at this time.  Recommend Physical Therapy screen in 6-9 months.    MARRIOTT,AMY W. 10/19/2014, 1:34 PM   [place], PT 10/19/2014 1:36 PM Phone: [phone] Fax: [phone]

## 2014-10-19 NOTE — Therapy (Signed)
  Patient Details  Name: Troy [name] MRN: [id number] Date of Birth: 07/13/1941  Encounter Date: 10/19/2014  Speech Therapy Parkinson's Disease Screen  Date: 10-19-14    Decibel Level today: 66-67dB   Pt's volume is lower than normal (70-72dB)  Pt's wife indicated pt's volume is especially difficult to understand in background noise.   He would benefit from Speech-language eval for dysarthria. If agreed please prescribe via EPIC.   Garald Balding, [place], [place] Phone: [phone] Fax: [phone]  10/19/2014, 2:00 PM

## 2014-10-19 NOTE — Therapy (Signed)
  Patient Details  Name: Troy [name] MRN: [id number] Date of Birth: 1941-02-27  Encounter Date: 10/19/2014   Occupational Therapy Parkinson's Disease Screen  Physical Performance Test item #2 (simulated eating):  12.53sec   Physical Performance Test item #4 (donning/doffing jacket):  21.34sec  9-hole peg test:    RUE  29.97sec        LUE  38.56sec  Box & Blocks Test:   RUE  35blocks        LUE  36blocks  Change in ability to perform ADLs/IADLs:  Decreased writing ability.  Other Comments:  Pt reports difficulty with writing with decreased legibility.  Pt/wife report cognitive changes.  Pt would benefit from occupational therapy evaluation due to coordination deficits, bradykinesia affecting ADLs.    Vianne Bulls, OTR/L 10/19/2014 2:11 PM Phone: [phone] Fax: [phone]     [facility] 10/19/2014, 2:09 PM

## 2014-11-02 ENCOUNTER — Other Ambulatory Visit (INDEPENDENT_AMBULATORY_CARE_PROVIDER_SITE_OTHER): Payer: Medicare Other | Admitting: *Deleted

## 2014-11-02 DIAGNOSIS — E785 Hyperlipidemia, unspecified: Secondary | ICD-10-CM

## 2014-11-02 LAB — LIPID PANEL
CHOL/HDL RATIO: 4
Cholesterol: 109 mg/dL (ref 0–200)
HDL: 30.5 mg/dL — ABNORMAL LOW (ref >39.00)
LDL CALC: 53 mg/dL (ref 0–99)
NonHDL: 78.5
Triglycerides: 126 mg/dL (ref 0.0–149.0)
VLDL: 25.2 mg/dL (ref 0.0–40.0)

## 2014-11-02 LAB — HEPATIC FUNCTION PANEL
ALT: 8 U/L (ref 0–53)
AST: 22 U/L (ref 0–37)
Albumin: 3.9 g/dL (ref 3.5–5.2)
Alkaline Phosphatase: 46 U/L (ref 39–117)
BILIRUBIN TOTAL: 0.8 mg/dL (ref 0.2–1.2)
Bilirubin, Direct: 0.1 mg/dL (ref 0.0–0.3)
Total Protein: 6.7 g/dL (ref 6.0–8.3)

## 2014-11-03 DIAGNOSIS — Z85828 Personal history of other malignant neoplasm of skin: Secondary | ICD-10-CM | POA: Diagnosis not present

## 2014-11-03 DIAGNOSIS — L821 Other seborrheic keratosis: Secondary | ICD-10-CM | POA: Diagnosis not present

## 2014-11-03 DIAGNOSIS — D1801 Hemangioma of skin and subcutaneous tissue: Secondary | ICD-10-CM | POA: Diagnosis not present

## 2014-11-04 ENCOUNTER — Encounter: Payer: Self-pay | Admitting: Cardiology

## 2014-11-04 ENCOUNTER — Other Ambulatory Visit: Payer: Medicare Other

## 2014-11-04 ENCOUNTER — Ambulatory Visit (INDEPENDENT_AMBULATORY_CARE_PROVIDER_SITE_OTHER): Payer: Medicare Other | Admitting: Pharmacist

## 2014-11-04 ENCOUNTER — Ambulatory Visit (INDEPENDENT_AMBULATORY_CARE_PROVIDER_SITE_OTHER): Payer: Medicare Other | Admitting: Cardiology

## 2014-11-04 VITALS — BP 134/82 | HR 50 | Ht 67.0 in | Wt 199.4 lb

## 2014-11-04 DIAGNOSIS — I10 Essential (primary) hypertension: Secondary | ICD-10-CM

## 2014-11-04 DIAGNOSIS — Z7901 Long term (current) use of anticoagulants: Secondary | ICD-10-CM | POA: Diagnosis not present

## 2014-11-04 DIAGNOSIS — I480 Paroxysmal atrial fibrillation: Secondary | ICD-10-CM | POA: Diagnosis not present

## 2014-11-04 DIAGNOSIS — I2583 Coronary atherosclerosis due to lipid rich plaque: Principal | ICD-10-CM

## 2014-11-04 DIAGNOSIS — I4891 Unspecified atrial fibrillation: Secondary | ICD-10-CM

## 2014-11-04 DIAGNOSIS — I2510 Atherosclerotic heart disease of native coronary artery without angina pectoris: Secondary | ICD-10-CM

## 2014-11-04 DIAGNOSIS — Z5181 Encounter for therapeutic drug level monitoring: Secondary | ICD-10-CM

## 2014-11-04 DIAGNOSIS — G4733 Obstructive sleep apnea (adult) (pediatric): Secondary | ICD-10-CM | POA: Diagnosis not present

## 2014-11-04 LAB — POCT INR: INR: 2.5

## 2014-11-04 NOTE — Progress Notes (Signed)
[address], [place], [place]  [address] Phone: [phone] Fax:  [phone]  Date:  11/04/2014   ID:  Troy [name], DOB July 03, 1941, MRN [id number]  PCP:  Lilian Coma, MD  Cardiologist:  Troy Him, MD    History of Present Illness: Troy [name] is a 73 y.o. male with a history of ASCAD s/p CABG, HTN, PAF, dyslipidemia, OSA now using oral device, dyslipidemia and atrial fibrillation who presents today for followup. He denies any chest pain, SOB, DOE, LE edema, dizziness, palpitations or syncope. He tolerates the oral device for OSA. He feels rested when he gets up and has no daytime sleepiness if he sleeps well the night before. His main complaint is problems sleeping with his Parkinson's meds.   Wt Readings from Last 3 Encounters:  11/04/14 199 lb 6.4 oz (90.447 kg)  08/23/14 198 lb (89.812 kg)  07/09/14 200 lb 12.8 oz (91.082 kg)     Past Medical History  Diagnosis Date  . Constipation   . Diabetes mellitus   . Facial nerve spasm     L eye, tx'd with botox  . Fecal impaction   . Hypertension   . Hypothyroidism   . Hyperlipidemia   . Diverticulosis   . Gout   . CKD (chronic kidney disease) stage 2, GFR 60-89 ml/min   . OSA (obstructive sleep apnea)     intolerant to CPAP followed by Dr. Lamonte Sakai  . AAA (abdominal aortic aneurysm)     with mural thrombus  . Hepatic steatosis   . Coronary artery disease 07/2009    severe 3 vessel ASCAD s/p CABG with LIMA to LAD, SVG to diag, SVG to PL  . PAF (paroxysmal atrial fibrillation)   . Chronic anticoagulation     Current Outpatient Prescriptions  Medication Sig Dispense Refill  . amLODipine (NORVASC) 10 MG tablet TAKE ONE TABLET BY MOUTH ONCE DAILY *NEEDS  APPOINTMENT* 30 tablet 3  . aspirin EC 81 MG tablet Take 81 mg by mouth daily.    . carbidopa-levodopa (SINEMET CR) 50-200 MG per tablet Take 1 tablet by mouth 3 (three) times daily. 90 tablet 5  . carboxymethylcellulose (REFRESH PLUS) 0.5 % SOLN  Place 1 drop into both eyes 3 (three) times daily as needed (dry eyes).    . Cholecalciferol (VITAMIN D-3) 1000 UNITS CAPS Take 1,000 Units by mouth daily.    . Cinnamon 500 MG capsule Take 500 mg by mouth daily.     . clindamycin (CLEOCIN) 150 MG capsule Take 150 mg by mouth as needed.    . clonazePAM (KLONOPIN) 0.5 MG tablet Take 1 tablet (0.5 mg total) by mouth at bedtime. 30 tablet 5  . DHA-EPA-Vitamin E 300-923-3 MG-MG-UNIT CAPS Take 1 capsule by mouth 2 (two) times daily as needed (dry eyes).     Marland Kitchen donepezil (ARICEPT) 10 MG tablet Take 10 mg by mouth at bedtime.    . entacapone (COMTAN) 200 MG tablet Take 1 tablet (200 mg total) by mouth 3 (three) times daily. 90 tablet 5  . EPIPEN 2-PAK 0.3 MG/0.3ML SOAJ Inject 0.3 mg into the muscle once as needed. Allergic reaction    . fluticasone (FLONASE) 50 MCG/ACT nasal spray Place 2 sprays into the nose daily as needed for allergies.     Marland Kitchen GRAPE SEED EXTRACT PO Take 100 mg by mouth daily. OTC    . levothyroxine (SYNTHROID, LEVOTHROID) 125 MCG tablet Take 125 mcg by mouth daily.      Marland Kitchen  lisinopril (PRINIVIL,ZESTRIL) 40 MG tablet Take 1 tablet (40 mg total) by mouth daily. 30 tablet 2  . nebivolol (BYSTOLIC) 5 MG tablet take 1 tablet by mouth once daily 30 tablet 5  . OVER THE COUNTER MEDICATION Take 1 tablet by mouth 2 (two) times daily. Macular degeneration supplement    . OVER THE COUNTER MEDICATION Take 1 capsule by mouth daily. Homocysteine supplement    . rosuvastatin (CRESTOR) 5 MG tablet Take 1 tablet (5 mg total) by mouth daily. 30 tablet 6  . warfarin (COUMADIN) 5 MG tablet Take 1 tablet (5 mg total) by mouth as directed. Take As Directed 40 tablet 3  . clobetasol (TEMOVATE) 0.05 % external solution Apply 1 application topically 2 (two) times daily as needed (itching).     Marland Kitchen ketoconazole (NIZORAL) 2 % shampoo Apply 1 application topically 2 (two) times daily as needed for irritation.      No current facility-administered medications for  this visit.    Allergies:    Allergies  Allergen Reactions  . Penicillins Rash  . Tetanus Toxoids Rash    Social History:  The patient  reports that he quit smoking about 6 years ago. His smoking use included Cigarettes. He smoked 0.00 packs per day for 15 years. He has never used smokeless tobacco. He reports that he drinks about 1.2 oz of alcohol per week. He reports that he does not use illicit drugs.   Family History:  The patient's family history includes Alzheimer's disease in his brother, father, mother, and paternal uncle; Heart disease in his mother.   ROS:  Please see the history of present illness.      All other systems reviewed and negative.   PHYSICAL EXAM: VS:  BP 134/82 mmHg  Pulse 50  Ht 5\' 7"  (1.702 m)  Wt 199 lb 6.4 oz (90.447 kg)  BMI 31.22 kg/m2 Well nourished, well developed, in no acute distress HEENT: normal Neck: no JVD Cardiac:  normal S1, S2; RRR; no murmur Lungs:  clear to auscultation bilaterally, no wheezing, rhonchi or rales Abd: soft, nontender, no hepatomegaly Ext: trace edemaR>L Skin: warm and dry Neuro:  CNs 2-12 intact, no focal abnormalities noted  EKG:  Sinus bradycardia with nonspecific ST abnormality, LAFB, LVH by voltage  ASSESSMENT AND PLAN:  1. ASCAD s/p CABG  with no angina - continue ASA  2. OSA using mouth appliance 3. Dyslipidemia - lipids are at goal - continue Crestor/fish oil  4. HTN - controlled - continue Lisinopril/Bystolic/amlodipine  5. Atrial fibrillation - maintaining sinus bradycardia - continue Bystolic/warfarin   Followup with me in 6 months     Signed, Troy Him, MD [facility] 11/04/2014 9:39 AM

## 2014-11-04 NOTE — Patient Instructions (Addendum)
Your physician recommends that you continue on your current medications as directed. Please refer to the Current Medication list given to you today.  Your physician wants you to follow-up in: 6 months with Dr Turner You will receive a reminder letter in the mail two months in advance. If you don't receive a letter, please call our office to schedule the follow-up appointment.  

## 2014-11-06 ENCOUNTER — Encounter: Payer: Self-pay | Admitting: Neurology

## 2014-11-10 ENCOUNTER — Other Ambulatory Visit: Payer: Self-pay | Admitting: Cardiology

## 2014-11-11 ENCOUNTER — Other Ambulatory Visit: Payer: Self-pay

## 2014-11-12 ENCOUNTER — Other Ambulatory Visit: Payer: Self-pay | Admitting: Vascular Surgery

## 2014-11-12 ENCOUNTER — Encounter: Payer: Self-pay | Admitting: *Deleted

## 2014-11-12 DIAGNOSIS — Z79899 Other long term (current) drug therapy: Secondary | ICD-10-CM | POA: Diagnosis not present

## 2014-11-12 DIAGNOSIS — I714 Abdominal aortic aneurysm, without rupture: Secondary | ICD-10-CM | POA: Diagnosis not present

## 2014-11-12 LAB — BUN: BUN: 14 mg/dL (ref 6–23)

## 2014-11-12 LAB — CREATININE, SERUM: Creat: 0.9 mg/dL (ref 0.50–1.35)

## 2014-11-15 ENCOUNTER — Telehealth: Payer: Self-pay | Admitting: Neurology

## 2014-11-15 ENCOUNTER — Encounter: Payer: Self-pay | Admitting: Vascular Surgery

## 2014-11-15 NOTE — Telephone Encounter (Signed)
Called and informed wife that letter is ready for pick up from front desk, verbalized understanding

## 2014-11-16 ENCOUNTER — Other Ambulatory Visit: Payer: Self-pay

## 2014-11-16 ENCOUNTER — Ambulatory Visit (INDEPENDENT_AMBULATORY_CARE_PROVIDER_SITE_OTHER): Payer: Medicare Other | Admitting: Vascular Surgery

## 2014-11-16 ENCOUNTER — Ambulatory Visit
Admission: RE | Admit: 2014-11-16 | Discharge: 2014-11-16 | Disposition: A | Discharge status: Home / Self Care | Payer: Medicare Other | Source: Ambulatory Visit | Attending: Vascular Surgery | Admitting: Vascular Surgery

## 2014-11-16 ENCOUNTER — Encounter: Payer: Self-pay | Admitting: Vascular Surgery

## 2014-11-16 VITALS — BP 135/76 | HR 50 | Ht 67.0 in | Wt 200.2 lb

## 2014-11-16 DIAGNOSIS — I2510 Atherosclerotic heart disease of native coronary artery without angina pectoris: Secondary | ICD-10-CM

## 2014-11-16 DIAGNOSIS — K7689 Other specified diseases of liver: Secondary | ICD-10-CM | POA: Diagnosis not present

## 2014-11-16 DIAGNOSIS — I714 Abdominal aortic aneurysm, without rupture, unspecified: Secondary | ICD-10-CM

## 2014-11-16 DIAGNOSIS — T82330D Leakage of aortic (bifurcation) graft (replacement), subsequent encounter: Secondary | ICD-10-CM

## 2014-11-16 DIAGNOSIS — Z48812 Encounter for surgical aftercare following surgery on the circulatory system: Secondary | ICD-10-CM

## 2014-11-16 DIAGNOSIS — N281 Cyst of kidney, acquired: Secondary | ICD-10-CM | POA: Diagnosis not present

## 2014-11-16 DIAGNOSIS — D175 Benign lipomatous neoplasm of intra-abdominal organs: Secondary | ICD-10-CM | POA: Diagnosis not present

## 2014-11-16 DIAGNOSIS — IMO0001 Reserved for inherently not codable concepts without codable children: Secondary | ICD-10-CM

## 2014-11-16 MED ORDER — IOHEXOL 350 MG/ML SOLN
75.0000 mL | Freq: Once | INTRAVENOUS | Status: AC | PRN
  Administered 2014-11-16: 75 mL via INTRAVENOUS

## 2014-11-16 NOTE — Progress Notes (Signed)
Subjective:     Patient ID: Troy [name], male   DOB: [birth year], 73 y.o.   MRN: [id number]  HPI this 73 year old male returns for continued follow-up regarding his abdominal aortic stent graft which was placed in 2011 for an abdominal aortic aneurysm. He has done well since that time from a vascular standpoint. He has been noted to have a small type II endoleak which we have been following. The sac size has continued to slightly decrease at 5.2 cm previous study one year ago. Patient does have diagnosis of Parkinson's disease which affects his ambulation to some degree and he is on new medications. Also has chronic atrial fibrillation and is on chronic Coumadin therapy. Has had no change in his bowel habits and is able to ambulate reasonably well.  Past Medical History  Diagnosis Date  . Constipation   . Diabetes mellitus   . Facial nerve spasm     L eye, tx'd with botox  . Fecal impaction   . Hypertension   . Hypothyroidism   . Hyperlipidemia   . Diverticulosis   . Gout   . CKD (chronic kidney disease) stage 2, GFR 60-89 ml/min   . OSA (obstructive sleep apnea)     intolerant to CPAP followed by Dr. Lamonte Sakai  . AAA (abdominal aortic aneurysm)     with mural thrombus  . Hepatic steatosis   . Coronary artery disease 07/2009    severe 3 vessel ASCAD s/p CABG with LIMA to LAD, SVG to diag, SVG to PL  . PAF (paroxysmal atrial fibrillation)   . Chronic anticoagulation     History  Substance Use Topics  . Smoking status: Former Smoker -- 15 years    Types: Cigarettes    Quit date: 10/07/2008  . Smokeless tobacco: Never Used  . Alcohol Use: 1.2 - 1.8 oz/week    2-3 Glasses of wine per week     Comment: OCC.    Family History  Problem Relation Age of Onset  . Heart disease Mother     CHF  . Alzheimer's disease Mother   . Alzheimer's disease Father   . Alzheimer's disease Brother   . Alzheimer's disease Paternal Uncle     Allergies  Allergen Reactions  . Penicillins Rash  .  Tetanus Toxoids Rash    Current outpatient prescriptions: amLODipine (NORVASC) 10 MG tablet, TAKE ONE TABLET BY MOUTH ONCE DAILY *NEEDS  APPOINTMENT*, Disp: 30 tablet, Rfl: 3;  aspirin EC 81 MG tablet, Take 81 mg by mouth daily., Disp: , Rfl: ;  carbidopa-levodopa (SINEMET CR) 50-200 MG per tablet, Take 1 tablet by mouth 3 (three) times daily., Disp: 90 tablet, Rfl: 5 carboxymethylcellulose (REFRESH PLUS) 0.5 % SOLN, Place 1 drop into both eyes 3 (three) times daily as needed (dry eyes)., Disp: , Rfl: ;  Cholecalciferol (VITAMIN D-3) 1000 UNITS CAPS, Take 1,000 Units by mouth daily., Disp: , Rfl: ;  Cinnamon 500 MG capsule, Take 500 mg by mouth daily. , Disp: , Rfl: ;  clindamycin (CLEOCIN) 150 MG capsule, Take 150 mg by mouth as needed., Disp: , Rfl:  clobetasol (TEMOVATE) 0.05 % external solution, Apply 1 application topically 2 (two) times daily as needed (itching). , Disp: , Rfl: ;  clonazePAM (KLONOPIN) 0.5 MG tablet, Take 1 tablet (0.5 mg total) by mouth at bedtime., Disp: 30 tablet, Rfl: 5;  DHA-EPA-Vitamin E 102-585-2 MG-MG-UNIT CAPS, Take 1 capsule by mouth 2 (two) times daily as needed (dry eyes). , Disp: , Rfl:  donepezil (  ARICEPT) 10 MG tablet, Take 10 mg by mouth at bedtime., Disp: , Rfl: ;  entacapone (COMTAN) 200 MG tablet, Take 1 tablet (200 mg total) by mouth 3 (three) times daily., Disp: 90 tablet, Rfl: 5;  EPIPEN 2-PAK 0.3 MG/0.3ML SOAJ, Inject 0.3 mg into the muscle once as needed. Allergic reaction, Disp: , Rfl: ;  fluticasone (FLONASE) 50 MCG/ACT nasal spray, Place 2 sprays into the nose daily as needed for allergies. , Disp: , Rfl:  GRAPE SEED EXTRACT PO, Take 100 mg by mouth daily. OTC, Disp: , Rfl: ;  ketoconazole (NIZORAL) 2 % shampoo, Apply 1 application topically 2 (two) times daily as needed for irritation. , Disp: , Rfl: ;  levothyroxine (SYNTHROID, LEVOTHROID) 125 MCG tablet, Take 125 mcg by mouth daily.  , Disp: , Rfl: ;  lisinopril (PRINIVIL,ZESTRIL) 40 MG tablet, TAKE ONE  TABLET BY MOUTH ONCE DAILY, Disp: 30 tablet, Rfl: 0 nebivolol (BYSTOLIC) 5 MG tablet, take 1 tablet by mouth once daily, Disp: 30 tablet, Rfl: 5;  OVER THE COUNTER MEDICATION, Take 1 tablet by mouth 2 (two) times daily. Macular degeneration supplement, Disp: , Rfl: ;  OVER THE COUNTER MEDICATION, Take 1 capsule by mouth daily. Homocysteine supplement, Disp: , Rfl: ;  rosuvastatin (CRESTOR) 5 MG tablet, Take 1 tablet (5 mg total) by mouth daily., Disp: 30 tablet, Rfl: 6 warfarin (COUMADIN) 5 MG tablet, Take 1 tablet (5 mg total) by mouth as directed. Take As Directed, Disp: 40 tablet, Rfl: 3  BP 135/76 mmHg  Pulse 50  Ht 5\' 7"  (1.702 m)  Wt 200 lb 3.2 oz (90.81 kg)  BMI 31.35 kg/m2  SpO2 97%  Body mass index is 31.35 kg/(m^2).           Review of Systems denies chest pain, dyspnea on exertion, PND, orthopnea. Does have some instability of gait due to his Parkinson's. See history of present illness for cardiac symptoms. Other systems negative and a complete review of systems     Objective:   Physical Exam BP 135/76 mmHg  Pulse 50  Ht 5\' 7"  (1.702 m)  Wt 200 lb 3.2 oz (90.81 kg)  BMI 31.35 kg/m2  SpO2 97%  Gen.-alert and oriented x3 in no apparent distress HEENT normal for age except left eye Lungs no rhonchi or wheezing Cardiovascular regular rhythm no murmurs carotid pulses 3+ palpable no bruits audible Abdomen soft nontender no palpable masses Musculoskeletal free of  major deformities Skin clear -no rashes Neurologic normal Lower extremities 3+ femoral and dorsalis pedis pulses palpable bilaterally with 1+ edema bilaterally.  Today I ordered CT angiogram of the abdomen and pelvis which I reviewed by computer. The maximum diameter of the aneurysm sac continues to be 51-52 mm. They're to be a small type II endoleak which appears unchanged from last year. Graft has not migrated. There are no fractured stents.       Assessment:     Doing well 4-1/2 years post  endovascular stent graft repair of abdominal aortic aneurysm with small type II endoleak and no change in sac diameter Parkinson's disease History coronary artery bypass grafting    Plan:     Return in one year with CT angiogram of abdomen and pelvis

## 2014-12-02 ENCOUNTER — Ambulatory Visit (INDEPENDENT_AMBULATORY_CARE_PROVIDER_SITE_OTHER): Payer: Medicare Other | Admitting: *Deleted

## 2014-12-02 DIAGNOSIS — I4891 Unspecified atrial fibrillation: Secondary | ICD-10-CM | POA: Diagnosis not present

## 2014-12-02 DIAGNOSIS — Z5181 Encounter for therapeutic drug level monitoring: Secondary | ICD-10-CM | POA: Diagnosis not present

## 2014-12-02 LAB — POCT INR: INR: 2.7

## 2014-12-06 IMAGING — US US BIOPSY
1 series · 9 of 9 positions shown · non-contrast
Comparison: none

CLINICAL DATA: History of left sided facial twitching, now with an
indeterminate left-sided cystic parotid gland mass.

[Series 1: us biopsy · 0.06mm/px · 9 acquisitions, 9 frames shown]
[im 1/9]
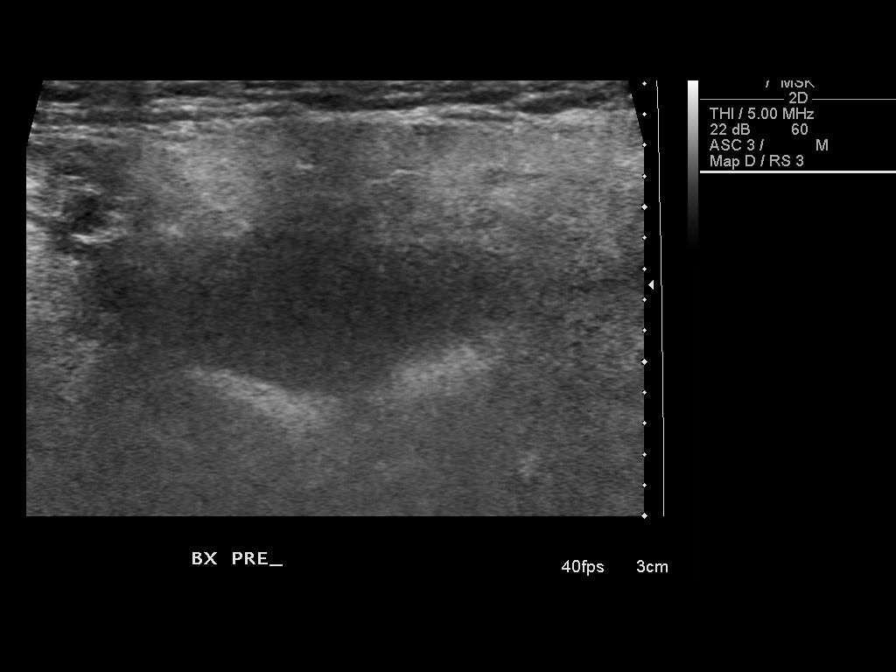
[im 2/9]
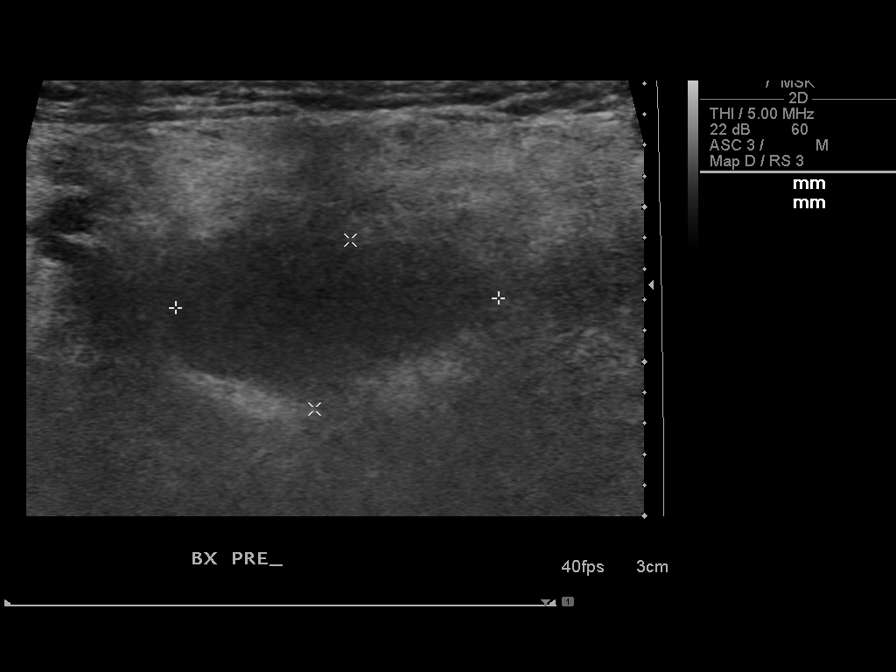
[im 3/9]
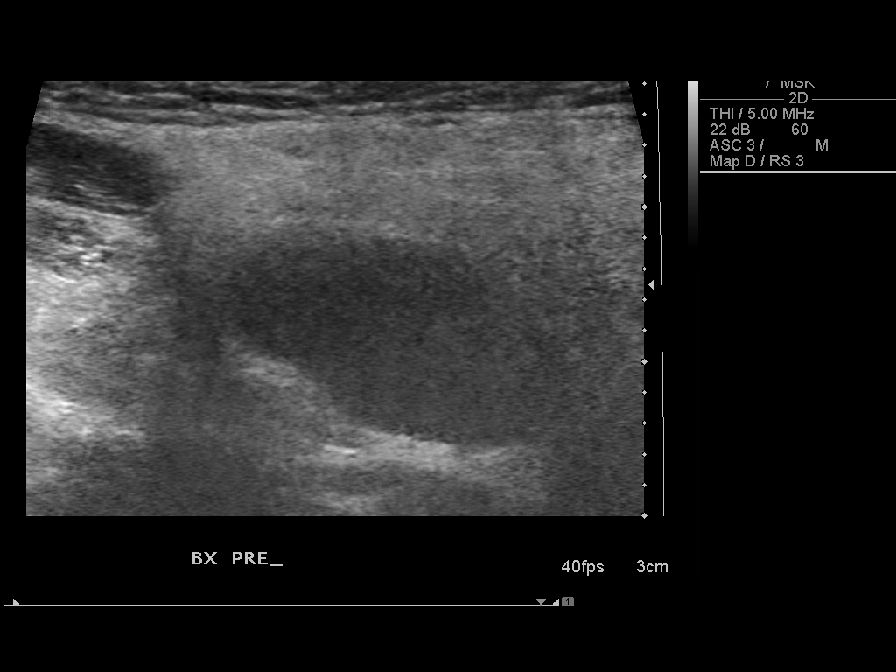
[im 4/9]
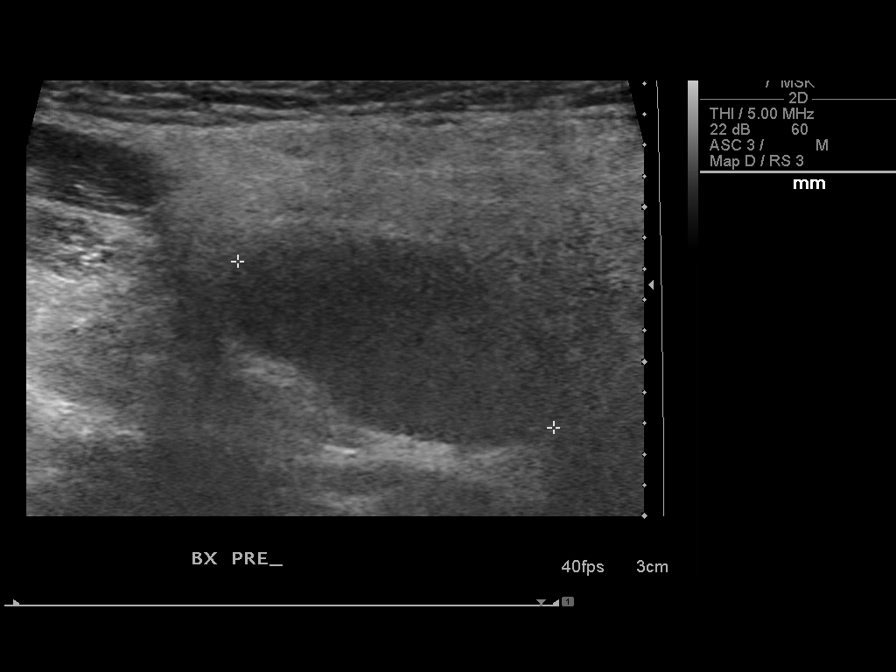
[im 5/9]
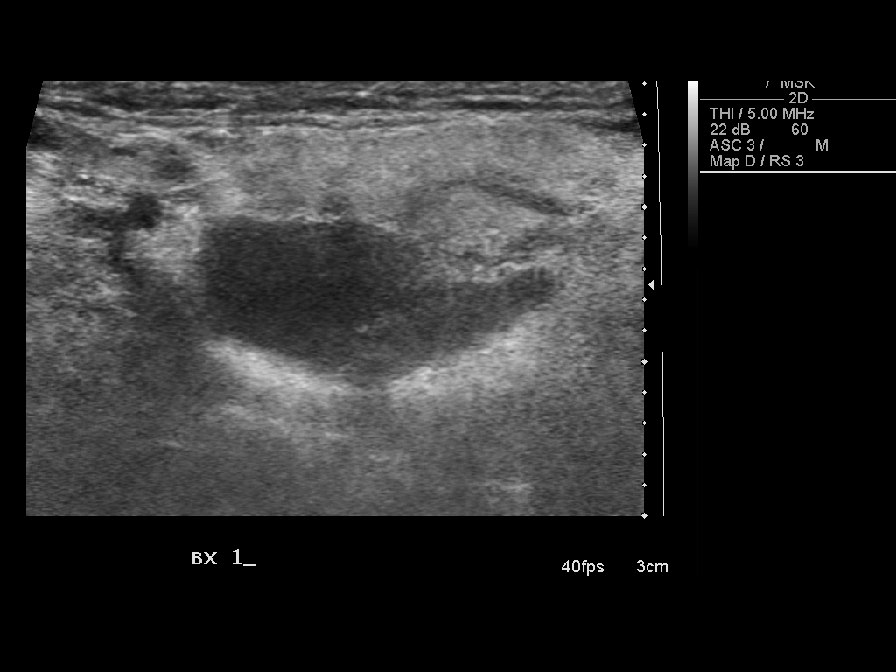
[im 6/9]
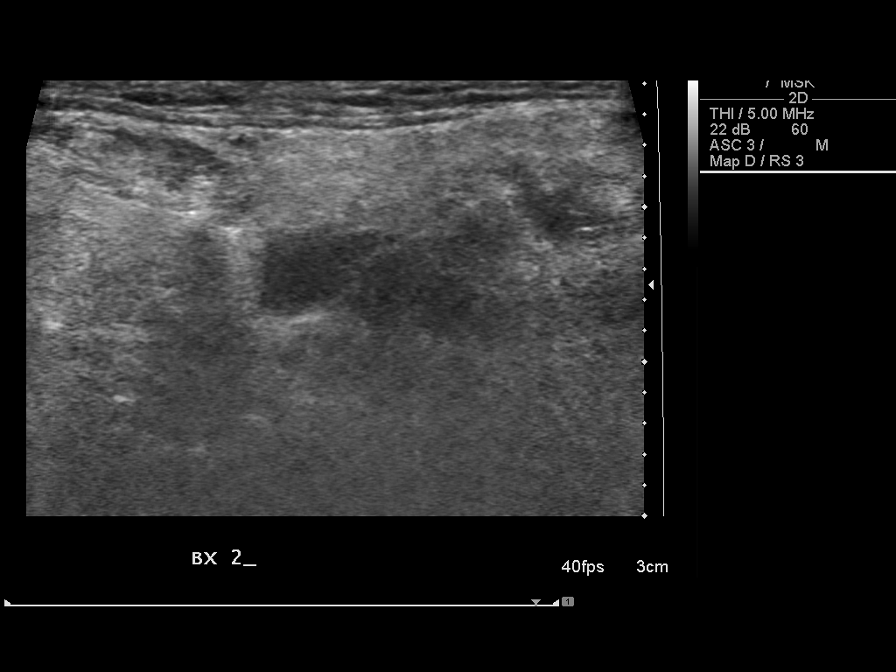
[im 7/9]
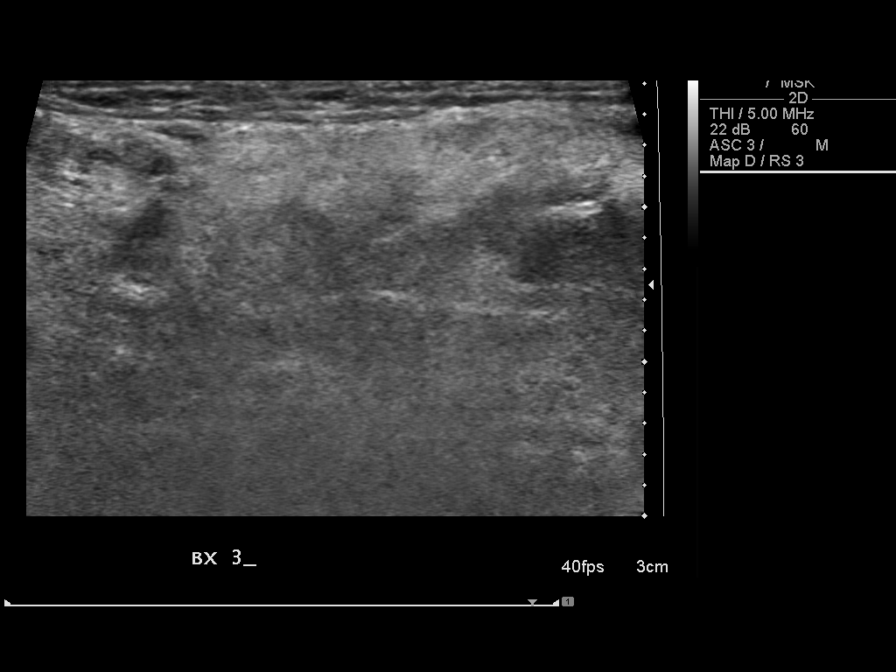
[im 8/9]
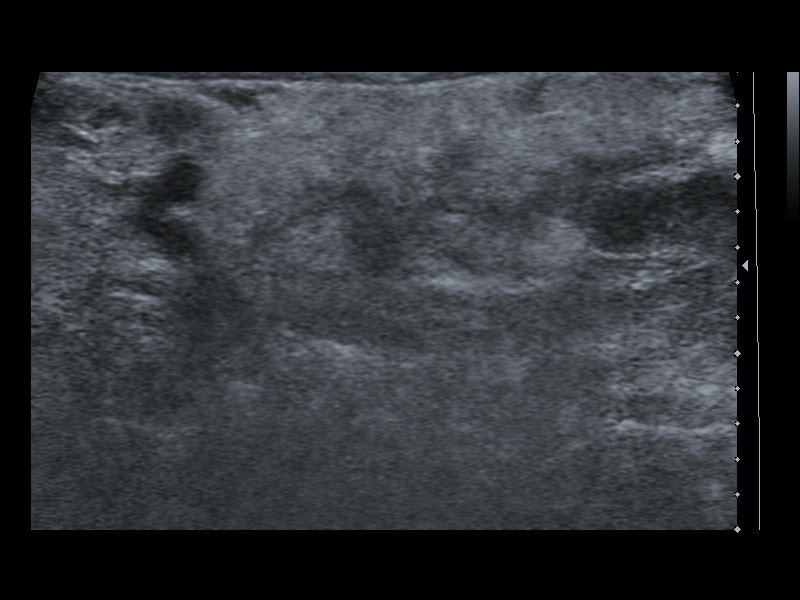
[im 9/9]
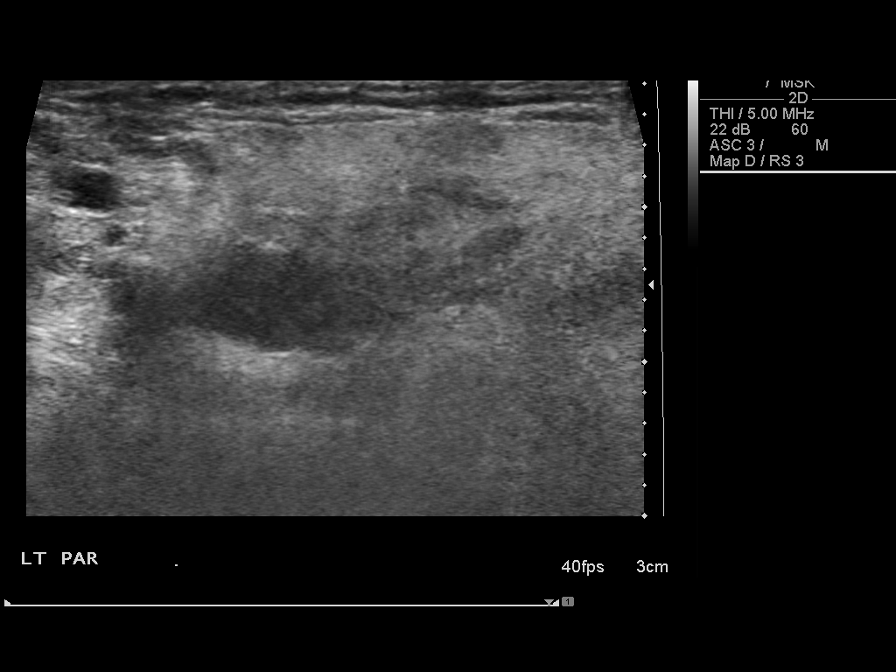

[9 of 9 positions shown; findings below may reference images not displayed]

ULTRASOUND GUIDED LEFT SIDED PARTOID GLAND MASS ASPRIATION AND FINE
NEEDLE ASPIRATION

Comparisons:  Neck CT - 03/02/2013; brain MRI - 11/13/2012

Intravenous Medications: None

Complications: None immediate

Technique / Findings:

Informed written consent was obtained from the patient after a
discussion of the risks, benefits and alternatives to treatment.
Questions regarding the procedure were encouraged and answered.  A
timeout was performed prior to the initiation of the procedure.

Pre-procedural ultrasound scanning demonstrated an approximately
2.1 x 1.2 x 2.3 cm anechoic lesion deep to the inferior aspect of
the left parotid gland correlating with the cystic lesions seen on
preprocedural neck CT and brain MRI.  The procedure was planned.
The neck was prepped in the usual sterile fashion, and a sterile
drape was applied covering the operative field.  A timeout was
performed prior to the initiation of the procedure.  Local
anesthesia was provided with 1% lidocaine.

Under direct ultrasound guidance, a fine-needle aspiration was
obtained with a 25 gauge needle.  An ultrasound image was saved for
documentation purposes.  Given the return of a small amount of
fluid, an 18 gauge needle was utilized to access the fluid
collection yielding approximately 2.5 ml of milky colored fluid.
The fluid was placed in the cytology wash.  This was followed by an
additional fine-needle aspiration w of the residual cystic lesion
wall with a 25 gauge needle.  The samples were prepared and
submitted to pathology.  Limited post procedural scanning was
negative for hematoma or additional complication.  A dressing was
placed.  The patient tolerated procedure well without immediate
postprocedural complication.
IMPRESSION: Technically successful ultrasound guided fine needle aspiration of
cystic lesion inferior to the left lobe of the parotid.  Note
approximately 2.5 ml of milky fluid was aspirated from the lesion
and sent to the laboratory for analysis.  Also of note, the patient
reported marked improvement of his left sided facial twitching and
left eye droop following the aspiration of the fluid.

## 2014-12-08 ENCOUNTER — Other Ambulatory Visit: Payer: Self-pay | Admitting: Cardiology

## 2014-12-28 ENCOUNTER — Other Ambulatory Visit: Payer: Self-pay | Admitting: Cardiology

## 2015-01-06 ENCOUNTER — Other Ambulatory Visit: Payer: Self-pay | Admitting: Cardiology

## 2015-01-11 ENCOUNTER — Other Ambulatory Visit: Payer: Self-pay | Admitting: Cardiology

## 2015-01-13 ENCOUNTER — Ambulatory Visit (INDEPENDENT_AMBULATORY_CARE_PROVIDER_SITE_OTHER): Payer: Medicare Other | Admitting: *Deleted

## 2015-01-13 DIAGNOSIS — I4891 Unspecified atrial fibrillation: Secondary | ICD-10-CM

## 2015-01-13 DIAGNOSIS — Z5181 Encounter for therapeutic drug level monitoring: Secondary | ICD-10-CM

## 2015-01-13 LAB — POCT INR: INR: 2.4

## 2015-01-17 DIAGNOSIS — G518 Other disorders of facial nerve: Secondary | ICD-10-CM | POA: Diagnosis not present

## 2015-01-17 DIAGNOSIS — G513 Clonic hemifacial spasm: Secondary | ICD-10-CM | POA: Diagnosis not present

## 2015-01-27 ENCOUNTER — Telehealth: Payer: Self-pay | Admitting: Neurology

## 2015-01-27 NOTE — Telephone Encounter (Signed)
Left Vm message for patient to call back to reschedule appointment, physician is on vacation on that day.

## 2015-02-01 ENCOUNTER — Other Ambulatory Visit: Payer: Self-pay | Admitting: Cardiology

## 2015-02-03 ENCOUNTER — Encounter: Payer: Self-pay | Admitting: Cardiology

## 2015-02-11 DIAGNOSIS — H5319 Other subjective visual disturbances: Secondary | ICD-10-CM | POA: Diagnosis not present

## 2015-02-11 DIAGNOSIS — H40013 Open angle with borderline findings, low risk, bilateral: Secondary | ICD-10-CM | POA: Diagnosis not present

## 2015-02-14 ENCOUNTER — Other Ambulatory Visit: Payer: Self-pay | Admitting: Neurology

## 2015-02-14 NOTE — Telephone Encounter (Signed)
Patient has appt scheduled

## 2015-02-21 ENCOUNTER — Ambulatory Visit: Payer: Medicare Other | Admitting: Neurology

## 2015-02-21 ENCOUNTER — Ambulatory Visit (INDEPENDENT_AMBULATORY_CARE_PROVIDER_SITE_OTHER): Payer: Medicare Other

## 2015-02-21 DIAGNOSIS — Z5181 Encounter for therapeutic drug level monitoring: Secondary | ICD-10-CM

## 2015-02-21 DIAGNOSIS — I4891 Unspecified atrial fibrillation: Secondary | ICD-10-CM

## 2015-02-21 LAB — POCT INR: INR: 2.5

## 2015-02-28 ENCOUNTER — Other Ambulatory Visit: Payer: Self-pay | Admitting: Neurology

## 2015-03-01 ENCOUNTER — Telehealth: Payer: Self-pay

## 2015-03-01 NOTE — Telephone Encounter (Signed)
Talked to patient and wife and they are aware that his clonazepam Rx is ready to pick up at the front desk.

## 2015-03-01 NOTE — Telephone Encounter (Signed)
Patient has appt scheduled this month  

## 2015-03-07 ENCOUNTER — Encounter: Payer: Self-pay | Admitting: Neurology

## 2015-03-07 ENCOUNTER — Ambulatory Visit: Payer: Medicare Other | Admitting: Neurology

## 2015-03-07 ENCOUNTER — Ambulatory Visit (INDEPENDENT_AMBULATORY_CARE_PROVIDER_SITE_OTHER): Payer: Medicare Other | Admitting: Neurology

## 2015-03-07 VITALS — BP 118/75 | HR 53 | Temp 97.0°F | Resp 16 | Ht 67.0 in | Wt 196.0 lb

## 2015-03-07 DIAGNOSIS — G518 Other disorders of facial nerve: Secondary | ICD-10-CM | POA: Diagnosis not present

## 2015-03-07 DIAGNOSIS — G4710 Hypersomnia, unspecified: Secondary | ICD-10-CM | POA: Diagnosis not present

## 2015-03-07 DIAGNOSIS — G4752 REM sleep behavior disorder: Secondary | ICD-10-CM | POA: Diagnosis not present

## 2015-03-07 DIAGNOSIS — G3184 Mild cognitive impairment, so stated: Secondary | ICD-10-CM | POA: Diagnosis not present

## 2015-03-07 DIAGNOSIS — M7989 Other specified soft tissue disorders: Secondary | ICD-10-CM | POA: Diagnosis not present

## 2015-03-07 DIAGNOSIS — G20C Parkinsonism, unspecified: Secondary | ICD-10-CM

## 2015-03-07 DIAGNOSIS — G20 Parkinson's disease: Secondary | ICD-10-CM | POA: Diagnosis not present

## 2015-03-07 DIAGNOSIS — G5139 Clonic hemifacial spasm, unspecified: Secondary | ICD-10-CM

## 2015-03-07 MED ORDER — CARBIDOPA-LEVODOPA 25-100 MG PO TABS
1.0000 | ORAL_TABLET | Freq: Three times a day (TID) | ORAL | Status: DC
Start: 2015-03-07 — End: 2015-03-31

## 2015-03-07 MED ORDER — DONEPEZIL HCL 10 MG PO TABS: 10.0000 mg | ORAL_TABLET | Freq: Every day | ORAL | Status: DC

## 2015-03-07 NOTE — Patient Instructions (Addendum)
We will change your sinemet CR to Sinemet IR (immediate release): 25/100 mg take 1 pill 4 times a day, at 6, 10, 2 PM and 6 PM.  We will continue Aricept 10 mg daily.  Use compression stockings to your lower legs.  Drink more water.  Continue Comtan 3 times a day.

## 2015-03-07 NOTE — Progress Notes (Signed)
Subjective:    Patient ID: Troy [name] is a 74 y.o. male.  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 Maudry Mayhew, who presents for follow-up consultation of his parkinsonism. The patient is accompanied by his wife again today. I first met him on 08/23/2014 at the request of Dr. Erlinda Hong, at which time the patient reported 5 year history of parkinsonism. I kept him on the current medication regimen. 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   Today, 03/07/2015: He is able to provide his history, but details are provided by his wife. He was wondering if he can drive. She was previously told that he should no longer drive. She is having knee replacement surgery in July and he would have difficulty getting to and from his exercise program. He is working with a Physiological scientist 2 times a week. Physical therapy and occupational and speech therapy in the past has helped tremendously. His walking has come along way. Previously he had fallen repeatedly. his memory is about the same. He has had more sleepiness and this happens usually half an hour after he has taken his Sinemet. He takes a long-acting 3 times a day, 50-200 mg strength. He also takes Comtan 3 times a day. This medication regimen has not changed in a while. Thankfully he has not fallen recently. He has not been driving. His sleep is better since he was placed on clonazepam. He gets his eye exam regularly. 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  Previously:   He was diagnosed with parkinsonism about 5 years ago. He was placed on Sinemet and continues to take Sinemet ER 50-200 mg strength 3 times a day. Comtan was added recently about 4 months ago. 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 Maudry Mayhew for hemifacial spasm. Previous workup included MRI brain last year showing brain atrophy and small vessel white matter changes. 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 He also had LP done in January 2015 which did not show evidence of NPH, and the CSF was clean.   He noted more difficulty with his gait after stopping the comtan, so he re-started it. His sleep is better. He has had some memory loss. He has been on Aricept for about 2 years and memory has been stable. He has no VH, or AH. He has no mood issues.    His Past Medical History Is Significant For: Past Medical History  Diagnosis Date  . Constipation   . Diabetes mellitus   . Facial nerve spasm     L eye, tx'd with botox  . Fecal impaction   . Hypertension   . Hypothyroidism   . Hyperlipidemia   . Diverticulosis   . Gout   . CKD (chronic kidney disease) stage 2, GFR 60-89 ml/min   . OSA (obstructive sleep apnea)     intolerant to CPAP followed by Dr. Lamonte Sakai  .  AAA (abdominal aortic aneurysm)     with mural thrombus  . Hepatic steatosis   . Coronary artery disease 07/2009    severe 3 vessel ASCAD s/p CABG with LIMA to LAD, SVG to diag, SVG to PL  . PAF (paroxysmal atrial fibrillation)   . Chronic anticoagulation     His Past Surgical History Is Significant For: Past Surgical History  Procedure Laterality Date  . Coronary artery bypass graft  07-2009  . Spine surgery  1998  . Eye surgery Left May  2013    Cataract  . Eye surgery  Right June 2013    Cataract  . Abdominal aortic aneurysm repair  01-2010    aorto-bi-iliac Gore Excluder stent graft    His Family History Is Significant For: Family History  Problem Relation Age of Onset  . Heart disease Mother     CHF  . Alzheimer's disease Mother   . Alzheimer's disease Father   . Alzheimer's disease Brother   . Alzheimer's disease Paternal Uncle     His Social History Is Significant For: History   Social History  . Marital Status: Married    Spouse Name: N/A  . Number of Children: 3  . Years of Education: BS   Occupational History  . retired    Social History Main Topics  . Smoking status: Former Smoker -- 15 years    Types: Cigarettes    Quit date: 10/07/2008  . Smokeless tobacco: Never Used  . Alcohol Use: 1.2 - 1.8 oz/week    2-3 Glasses of wine per week     Comment: OCC.  . Drug Use: No  . Sexual Activity: No   Other Topics Concern  . None   Social History Narrative   Patient lives at home with his wife   Patient is right handed   Patient drinks coffee    His Allergies Are:  Allergies  Allergen Reactions  . Penicillins Rash  . Tetanus Toxoids Rash  :   His Current Medications Are:  Outpatient Encounter Prescriptions as of 03/07/2015  Medication Sig  . amLODipine (NORVASC) 10 MG tablet Take 1 tablet (10 mg total) by mouth daily.  Marland Kitchen aspirin EC 81 MG tablet Take 81 mg by mouth daily.  Marland Kitchen BYSTOLIC 5 MG tablet TAKE ONE TABLET BY MOUTH ONCE DAILY  . carbidopa-levodopa (SINEMET CR) 50-200 MG per tablet Take 1 tablet by mouth 3 (three) times daily.  . carboxymethylcellulose (REFRESH PLUS) 0.5 % SOLN Place 1 drop into both eyes 3 (three) times daily as needed (dry eyes).  . Cholecalciferol (VITAMIN D-3) 1000 UNITS CAPS Take 1,000 Units by mouth daily.  . Cinnamon 500 MG capsule Take 500 mg by mouth daily.   . clindamycin (CLEOCIN) 150 MG capsule Take 150 mg by mouth as needed.  . clobetasol (TEMOVATE) 0.05 % external solution Apply 1  application topically 2 (two) times daily as needed (itching).   . clonazePAM (KLONOPIN) 0.5 MG tablet TAKE ONE TABLET BY MOUTH AT BEDTIME  . DHA-EPA-Vitamin E 923-300-7 MG-MG-UNIT CAPS Take 1 capsule by mouth 2 (two) times daily as needed (dry eyes).   Marland Kitchen donepezil (ARICEPT) 10 MG tablet Take 10 mg by mouth at bedtime.  . entacapone (COMTAN) 200 MG tablet TAKE ONE TABLET BY MOUTH THREE TIMES DAILY  . EPIPEN 2-PAK 0.3 MG/0.3ML SOAJ Inject 0.3 mg into the muscle once as needed. Allergic reaction  . fluticasone (FLONASE) 50 MCG/ACT nasal spray Place 2 sprays into the nose daily as needed  for allergies.   Marland Kitchen GRAPE SEED EXTRACT PO Take 100 mg by mouth daily. OTC  . ketoconazole (NIZORAL) 2 % shampoo Apply 1 application topically 2 (two) times daily as needed for irritation.   Marland Kitchen levothyroxine (SYNTHROID, LEVOTHROID) 125 MCG tablet Take 125 mcg by mouth daily.    Marland Kitchen lisinopril (PRINIVIL,ZESTRIL) 40 MG tablet TAKE ONE TABLET BY MOUTH ONCE DAILY  . OVER THE COUNTER MEDICATION Take 1 tablet by mouth 2 (two) times daily. Macular degeneration supplement  . OVER THE COUNTER MEDICATION Take 1 capsule by mouth daily. Homocysteine supplement  . rosuvastatin (CRESTOR) 5 MG tablet Take 1 tablet (5 mg total) by mouth daily.  Marland Kitchen warfarin (COUMADIN) 5 MG tablet TAKE ONE TABLET BY MOUTH AS DIRECTED.  . [DISCONTINUED] CRESTOR 5 MG tablet TAKE ONE TABLET BY MOUTH ONCE DAILY  :  Review of Systems:  Out of a complete 14 point review of systems, all are reviewed and negative with the exception of these symptoms as listed below:   Review of Systems  Neurological:       Patient would like to discuss speaking with Dr. Rexene Alberts about taking over his Donepezil prescription. Patient would also like to discuss driving.     Objective:  Neurologic Exam  Physical Exam Physical Examination:   Filed Vitals:   03/07/15 0814  BP: 118/75  Pulse: 53  Temp: 97 F (36.1 C)  Resp: 16    General Examination: The patient is a  very pleasant 74 y.o. male in no acute distress.  HEENT: Normocephalic, atraumatic, pupils are equal, round and reactive to light and accommodation. Funduscopic exam is normal with sharp disc margins noted. Extraocular tracking shows very mild saccadic breakdown without nystagmus noted. He is s/p cataract repairs. There is limitation to upper gaze. There is a very mild decrease in eye blink rate. L eyelid is droopy. Hearing is impaired mildly. 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 There is no lip, neck or jaw tremor. Neck is minimally rigid with intact passive ROM. There are no carotid bruits on auscultation. Oropharynx exam reveals moderate mouth dryness. No significant airway crowding is noted. Mallampati is class II. Tongue protrudes centrally and palate elevates symmetrically. There is mild drooling.   Chest: is clear to auscultation without wheezing, rhonchi or crackles noted.  Heart: sounds are regular and normal without murmurs, rubs or gallops noted.   Abdomen: is soft, non-tender and non-distended with normal bowel sounds appreciated on auscultation.  Extremities: There is 1+ pitting edema in the distal lower extremities bilaterally. Pedal pulses are intact. Chronic stasis-like changes are noted in the distal legs bilaterally. There are no varicose veins.  Skin: is warm and dry with no trophic changes noted. Age-related changes are noted on the skin.   Musculoskeletal: exam reveals no obvious joint deformities, tenderness, joint swelling or erythema.  Neurologically:  Mental status: The patient is awake and alert, paying good  attention. He is able to partially provide the history. His wife provides most of the history. He is oriented to: person, place, situation, day of week, month of year and year. His memory, attention, language and knowledge are impaired mildly. There is no  aphasia, agnosia, apraxia or anomia. There is a mild degree of bradyphrenia. Speech is mildly hypophonic with minimal dysarthria noted. Mood is congruent and affect is normal.  Cranial nerves are as described above under HEENT exam. In addition, shoulder  shrug is normal with equal shoulder height noted.  Motor exam: Normal bulk, and strength for age is noted. There are no dyskinesias noted. Tone is mildly rigid with absence of cogwheeling in the extremities. There is overall mild bradykinesia. There is no drift or rebound. There is no tremor.   Romberg is negative.  Reflexes are 1+ in the upper extremities and 1+ in the lower extremities. Toes are downgoing bilaterally.  Fine motor skills exam: Finger taps are mildly impaired on the right and mildly impaired on the left. Hand movements are mildly impaired on the right and mildly impaired on the left. RAP (rapid alternating patting) is mildly impaired on the right and mildly impaired on the left. Foot taps are mildly impaired on the right and mildly impaired on the left. Foot agility (in the form of heel stomping) is moderately impaired on the right and moderately impaired on the left.    Cerebellar testing shows no dysmetria or intention tremor on finger to nose testing. Heel to shin is unremarkable bilaterally. There is no truncal or gait ataxia.   Sensory exam is intact to light touch, pinprick, vibration, temperature sense in the upper and lower extremities.   Gait, station and balance: He stands up from the seated position with mild difficulty and does need to push up with His hands. He needs no assistance. No veering to one side is noted. He is not noted to lean to the side. Posture is moderately stooped. No freezing is noted. Stance is narrow-based. He walks with decrease in stride length and pace and decreased arm swing on both sides. He has trouble turning. Balance is mildly impaired.   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 Maudry Mayhew, who was diagnosed with parkinsonism about 5 1/2 years ago.  he has been on Comtan and long-acting Sinemet. For mild memory loss he has been on Aricept. He has mild parkinsonism but no significant lateralization. He has had some response to Sinemet. He reports sleepiness from the medication. I would like to suggest a change from CR to immediate release Sinemet. To that end, he will with his next refill start Sinemet 25-100 milligrams strength one pill 4 times a day at 6, 10, 2 PM and 6 PM. We will keep the Aricept that same and monitor his symptoms. Comtan will continue at 3 times a day.  He is encouraged to drink more water. He is encouraged to start using compression stockings to his lower extremities which may help his lower blood pressure values and also the swelling.  We may have to readdress his blood pressure medication regimen down the [place].  He felt benefit with clonazepam. I  provided him with a new prescription for Sinemet 25-100 milligrams strength. I renewed his prescription for Aricept generic. He did not need any other refills today.   I will see him back routinely in 4-5 months or sooner if the need arises. We talked about his driving today and I discouraged him from driving. I do not feel comfortable with him driving. I answered all their questions today  and the patient and his wife were in agreement with the above outlined plan. I spent 25 minutes in total face-to-face time  with the patient, more than 50% of which was spent in counseling and coordination of care, reviewing test results, reviewing medication and discussing or reviewing the diagnosis of Parkinsonism, memory loss, the prognosis and treatment options.

## 2015-03-14 ENCOUNTER — Encounter: Payer: Self-pay | Admitting: Neurology

## 2015-03-16 DIAGNOSIS — I10 Essential (primary) hypertension: Secondary | ICD-10-CM | POA: Diagnosis not present

## 2015-03-16 DIAGNOSIS — H02831 Dermatochalasis of right upper eyelid: Secondary | ICD-10-CM | POA: Diagnosis not present

## 2015-03-16 DIAGNOSIS — Z887 Allergy status to serum and vaccine status: Secondary | ICD-10-CM | POA: Diagnosis not present

## 2015-03-16 DIAGNOSIS — H02422 Myogenic ptosis of left eyelid: Secondary | ICD-10-CM | POA: Diagnosis not present

## 2015-03-16 DIAGNOSIS — Z87891 Personal history of nicotine dependence: Secondary | ICD-10-CM | POA: Diagnosis not present

## 2015-03-16 DIAGNOSIS — E119 Type 2 diabetes mellitus without complications: Secondary | ICD-10-CM | POA: Diagnosis not present

## 2015-03-16 DIAGNOSIS — S0452XS Injury of facial nerve, left side, sequela: Secondary | ICD-10-CM | POA: Diagnosis not present

## 2015-03-16 DIAGNOSIS — Z7982 Long term (current) use of aspirin: Secondary | ICD-10-CM | POA: Diagnosis not present

## 2015-03-16 DIAGNOSIS — Z888 Allergy status to other drugs, medicaments and biological substances status: Secondary | ICD-10-CM | POA: Diagnosis not present

## 2015-03-16 DIAGNOSIS — L908 Other atrophic disorders of skin: Secondary | ICD-10-CM | POA: Diagnosis not present

## 2015-03-16 DIAGNOSIS — Z91030 Bee allergy status: Secondary | ICD-10-CM | POA: Diagnosis not present

## 2015-03-16 DIAGNOSIS — Z7901 Long term (current) use of anticoagulants: Secondary | ICD-10-CM | POA: Diagnosis not present

## 2015-03-16 DIAGNOSIS — Z880 Allergy status to penicillin: Secondary | ICD-10-CM | POA: Diagnosis not present

## 2015-03-16 DIAGNOSIS — H02834 Dermatochalasis of left upper eyelid: Secondary | ICD-10-CM | POA: Diagnosis not present

## 2015-03-17 ENCOUNTER — Other Ambulatory Visit: Payer: Self-pay | Admitting: Neurology

## 2015-03-18 ENCOUNTER — Telehealth: Payer: Self-pay | Admitting: Neurology

## 2015-03-18 NOTE — Telephone Encounter (Signed)
Troy [name], pt's wife called wanting to confirm the dosage for  entacapone (COMTAN) 200 MG tablet. Please call and confirm. C/b [phone]

## 2015-03-18 NOTE — Telephone Encounter (Signed)
OV note from 04/11 says: Continue Comtan 3 times a day. I called back.  Spoke with Ms Obey.  She verbalized understanding.  She will continue to give him the Comtan doses at the same time he was taking them previously, three times per day and understand he will take Sinemet four times per day.  They will call us back if anything further is needed.

## 2015-03-25 DIAGNOSIS — R001 Bradycardia, unspecified: Secondary | ICD-10-CM | POA: Diagnosis not present

## 2015-03-25 DIAGNOSIS — Z01810 Encounter for preprocedural cardiovascular examination: Secondary | ICD-10-CM | POA: Diagnosis not present

## 2015-03-25 DIAGNOSIS — I440 Atrioventricular block, first degree: Secondary | ICD-10-CM | POA: Diagnosis not present

## 2015-03-25 DIAGNOSIS — R9431 Abnormal electrocardiogram [ECG] [EKG]: Secondary | ICD-10-CM | POA: Diagnosis not present

## 2015-03-29 DIAGNOSIS — G4733 Obstructive sleep apnea (adult) (pediatric): Secondary | ICD-10-CM | POA: Diagnosis not present

## 2015-03-29 DIAGNOSIS — I2510 Atherosclerotic heart disease of native coronary artery without angina pectoris: Secondary | ICD-10-CM | POA: Diagnosis not present

## 2015-03-29 DIAGNOSIS — S0452XA Injury of facial nerve, left side, initial encounter: Secondary | ICD-10-CM | POA: Diagnosis not present

## 2015-03-29 DIAGNOSIS — I4891 Unspecified atrial fibrillation: Secondary | ICD-10-CM | POA: Diagnosis not present

## 2015-03-29 DIAGNOSIS — H02402 Unspecified ptosis of left eyelid: Secondary | ICD-10-CM | POA: Diagnosis not present

## 2015-03-29 DIAGNOSIS — Z7982 Long term (current) use of aspirin: Secondary | ICD-10-CM | POA: Diagnosis not present

## 2015-03-29 DIAGNOSIS — I10 Essential (primary) hypertension: Secondary | ICD-10-CM | POA: Diagnosis not present

## 2015-03-29 DIAGNOSIS — G20 Parkinson's disease: Secondary | ICD-10-CM | POA: Diagnosis not present

## 2015-03-29 DIAGNOSIS — E119 Type 2 diabetes mellitus without complications: Secondary | ICD-10-CM | POA: Diagnosis not present

## 2015-03-29 DIAGNOSIS — Z87891 Personal history of nicotine dependence: Secondary | ICD-10-CM | POA: Diagnosis not present

## 2015-03-29 DIAGNOSIS — H02834 Dermatochalasis of left upper eyelid: Secondary | ICD-10-CM | POA: Diagnosis not present

## 2015-03-30 ENCOUNTER — Other Ambulatory Visit: Payer: Self-pay | Admitting: Neurology

## 2015-03-31 ENCOUNTER — Telehealth: Payer: Self-pay | Admitting: Neurology

## 2015-03-31 ENCOUNTER — Other Ambulatory Visit: Payer: Self-pay | Admitting: Neurology

## 2015-03-31 NOTE — Telephone Encounter (Signed)
Pls call wife, he can go back to Sinemet CR 50/200 mg 1 pill tid as before and stop the Sinemet IR 4 times a day.  Pls place Rx for Sinement CR if needed. thx

## 2015-03-31 NOTE — Telephone Encounter (Signed)
Patients wife called and wanted to speak with someone regarding his dosage on his Rx. carbidopa-levodopa (SINEMET IR) 25-100 MG per tablet. Dr. Rexene Alberts changed the dosage and the patients walking has declined since then. She is concerned that he may need to be changed back to the previous dosage. He is still having the sleep attacks also. (The sleep attacks were the original reason for the change in dosage according to the patient). Please call and advise.

## 2015-03-31 NOTE — Telephone Encounter (Signed)
I called back.  Spoke with Troy [name].  Relayed providers message.  They are agreeable to this plan.  Says they still have refills on file, and did not require a new Rx at this time.  I have updated med list to reflect these changes.

## 2015-04-04 ENCOUNTER — Ambulatory Visit (INDEPENDENT_AMBULATORY_CARE_PROVIDER_SITE_OTHER): Payer: Medicare Other | Admitting: *Deleted

## 2015-04-04 DIAGNOSIS — I4891 Unspecified atrial fibrillation: Secondary | ICD-10-CM | POA: Diagnosis not present

## 2015-04-04 DIAGNOSIS — Z5181 Encounter for therapeutic drug level monitoring: Secondary | ICD-10-CM | POA: Diagnosis not present

## 2015-04-04 LAB — POCT INR: INR: 1.6

## 2015-04-06 DIAGNOSIS — Z7901 Long term (current) use of anticoagulants: Secondary | ICD-10-CM | POA: Diagnosis not present

## 2015-04-06 DIAGNOSIS — H02422 Myogenic ptosis of left eyelid: Secondary | ICD-10-CM | POA: Diagnosis not present

## 2015-04-06 DIAGNOSIS — Z887 Allergy status to serum and vaccine status: Secondary | ICD-10-CM | POA: Diagnosis not present

## 2015-04-06 DIAGNOSIS — I10 Essential (primary) hypertension: Secondary | ICD-10-CM | POA: Diagnosis not present

## 2015-04-06 DIAGNOSIS — G20 Parkinson's disease: Secondary | ICD-10-CM | POA: Diagnosis not present

## 2015-04-06 DIAGNOSIS — G4733 Obstructive sleep apnea (adult) (pediatric): Secondary | ICD-10-CM | POA: Diagnosis not present

## 2015-04-06 DIAGNOSIS — E119 Type 2 diabetes mellitus without complications: Secondary | ICD-10-CM | POA: Diagnosis not present

## 2015-04-06 DIAGNOSIS — I2510 Atherosclerotic heart disease of native coronary artery without angina pectoris: Secondary | ICD-10-CM | POA: Diagnosis not present

## 2015-04-06 DIAGNOSIS — Z951 Presence of aortocoronary bypass graft: Secondary | ICD-10-CM | POA: Diagnosis not present

## 2015-04-06 DIAGNOSIS — Z87891 Personal history of nicotine dependence: Secondary | ICD-10-CM | POA: Diagnosis not present

## 2015-04-06 DIAGNOSIS — Z880 Allergy status to penicillin: Secondary | ICD-10-CM | POA: Diagnosis not present

## 2015-04-06 DIAGNOSIS — H532 Diplopia: Secondary | ICD-10-CM | POA: Diagnosis not present

## 2015-04-06 DIAGNOSIS — S0452XS Injury of facial nerve, left side, sequela: Secondary | ICD-10-CM | POA: Diagnosis not present

## 2015-04-06 DIAGNOSIS — Z7982 Long term (current) use of aspirin: Secondary | ICD-10-CM | POA: Diagnosis not present

## 2015-04-18 DIAGNOSIS — G20 Parkinson's disease: Secondary | ICD-10-CM | POA: Diagnosis not present

## 2015-04-18 DIAGNOSIS — G518 Other disorders of facial nerve: Secondary | ICD-10-CM | POA: Diagnosis not present

## 2015-04-27 DIAGNOSIS — M109 Gout, unspecified: Secondary | ICD-10-CM | POA: Diagnosis not present

## 2015-04-27 DIAGNOSIS — I2510 Atherosclerotic heart disease of native coronary artery without angina pectoris: Secondary | ICD-10-CM | POA: Diagnosis not present

## 2015-04-27 DIAGNOSIS — G4700 Insomnia, unspecified: Secondary | ICD-10-CM | POA: Diagnosis not present

## 2015-04-27 DIAGNOSIS — G4733 Obstructive sleep apnea (adult) (pediatric): Secondary | ICD-10-CM | POA: Diagnosis not present

## 2015-04-27 DIAGNOSIS — Z0000 Encounter for general adult medical examination without abnormal findings: Secondary | ICD-10-CM | POA: Diagnosis not present

## 2015-04-27 DIAGNOSIS — E780 Pure hypercholesterolemia: Secondary | ICD-10-CM | POA: Diagnosis not present

## 2015-04-27 DIAGNOSIS — E039 Hypothyroidism, unspecified: Secondary | ICD-10-CM | POA: Diagnosis not present

## 2015-04-27 DIAGNOSIS — Z79899 Other long term (current) drug therapy: Secondary | ICD-10-CM | POA: Diagnosis not present

## 2015-04-27 DIAGNOSIS — E1122 Type 2 diabetes mellitus with diabetic chronic kidney disease: Secondary | ICD-10-CM | POA: Diagnosis not present

## 2015-04-27 DIAGNOSIS — E1140 Type 2 diabetes mellitus with diabetic neuropathy, unspecified: Secondary | ICD-10-CM | POA: Diagnosis not present

## 2015-04-27 DIAGNOSIS — Z23 Encounter for immunization: Secondary | ICD-10-CM | POA: Diagnosis not present

## 2015-04-27 DIAGNOSIS — E559 Vitamin D deficiency, unspecified: Secondary | ICD-10-CM | POA: Diagnosis not present

## 2015-04-27 NOTE — Progress Notes (Signed)
Cardiology Office Note   Date:  04/28/2015   ID:  Troy [name], DOB [birth year], MRN [id number]  PCP:  Lilian Coma, MD    Chief Complaint  Patient presents with  . Follow-up    OSA      History of Present Illness: Troy [name] is a 74 y.o. male with a history of ASCAD s/p CABG, HTN, PAF, dyslipidemia, OSA now using oral device, dyslipidemia and atrial fibrillation who presents today for followup. He denies any chest pain, SOB, DOE,  dizziness, palpitations or syncope. He occasionally has some RLE edema.  He tolerates the oral device for OSA. He feels rested when he gets up and has no daytime sleepiness if he sleeps well the night before. His main complaint is problems falling asleep with his Parkinson's meds.    Past Medical History  Diagnosis Date  . Constipation   . Diabetes mellitus   . Facial nerve spasm     L eye, tx'd with botox  . Fecal impaction   . Hypertension   . Hypothyroidism   . Hyperlipidemia   . Diverticulosis   . Gout   . CKD (chronic kidney disease) stage 2, GFR 60-89 ml/min   . OSA (obstructive sleep apnea)     intolerant to CPAP followed by Dr. Lamonte Sakai  . AAA (abdominal aortic aneurysm)     with mural thrombus  . Hepatic steatosis   . Coronary artery disease 07/2009    severe 3 vessel ASCAD s/p CABG with LIMA to LAD, SVG to diag, SVG to PL  . PAF (paroxysmal atrial fibrillation)   . Chronic anticoagulation     Past Surgical History  Procedure Laterality Date  . Coronary artery bypass graft  07-2009  . Spine surgery  1998  . Eye surgery Left May  2013    Cataract  . Eye surgery Right June 2013    Cataract  . Abdominal aortic aneurysm repair  01-2010    aorto-bi-iliac Gore Excluder stent graft     Current Outpatient Prescriptions  Medication Sig Dispense Refill  . amLODipine (NORVASC) 10 MG tablet Take 1 tablet (10 mg total) by mouth daily. 30 tablet 3  . aspirin EC 81 MG tablet Take 81 mg by mouth daily.    Marland Kitchen  BYSTOLIC 5 MG tablet TAKE ONE TABLET BY MOUTH ONCE DAILY 30 tablet 6  . carbidopa-levodopa (SINEMET CR) 50-200 MG per tablet TAKE ONE TABLET BY MOUTH THREE TIMES DAILY 90 tablet 3  . carboxymethylcellulose (REFRESH PLUS) 0.5 % SOLN Place 1 drop into both eyes 3 (three) times daily as needed (dry eyes).    . Cholecalciferol (VITAMIN D-3) 1000 UNITS CAPS Take 1,000 Units by mouth daily.    . Cinnamon 500 MG capsule Take 500 mg by mouth daily.     . clindamycin (CLEOCIN) 150 MG capsule Take 150 mg by mouth as needed.    . clobetasol (TEMOVATE) 0.05 % external solution Apply 1 application topically 2 (two) times daily as needed (itching).     . clonazePAM (KLONOPIN) 0.5 MG tablet TAKE ONE TABLET BY MOUTH ONCE DAILY AT BEDTIME 30 tablet 5  . DHA-EPA-Vitamin E 099-833-8 MG-MG-UNIT CAPS Take 1 capsule by mouth 2 (two) times daily as needed (dry eyes).     Marland Kitchen donepezil (ARICEPT) 10 MG tablet Take 1 tablet (10 mg total) by mouth at bedtime. 90 tablet 3  . entacapone (COMTAN)  200 MG tablet TAKE ONE TABLET BY MOUTH THREE TIMES DAILY 90 tablet 5  . EPIPEN 2-PAK 0.3 MG/0.3ML SOAJ Inject 0.3 mg into the muscle once as needed. Allergic reaction    . fluticasone (FLONASE) 50 MCG/ACT nasal spray Place 2 sprays into the nose daily as needed for allergies.     Marland Kitchen GRAPE SEED EXTRACT PO Take 100 mg by mouth daily. OTC    . ketoconazole (NIZORAL) 2 % shampoo Apply 1 application topically 2 (two) times daily as needed for irritation.     Marland Kitchen levothyroxine (SYNTHROID, LEVOTHROID) 125 MCG tablet Take 125 mcg by mouth daily.      Marland Kitchen lisinopril (PRINIVIL,ZESTRIL) 40 MG tablet TAKE ONE TABLET BY MOUTH ONCE DAILY 30 tablet 5  . OVER THE COUNTER MEDICATION Take 1 tablet by mouth 2 (two) times daily. Macular degeneration supplement    . OVER THE COUNTER MEDICATION Take 1 capsule by mouth daily. Homocysteine supplement    . rosuvastatin (CRESTOR) 5 MG tablet Take 1 tablet (5 mg total) by mouth daily. 30 tablet 6  . warfarin  (COUMADIN) 5 MG tablet TAKE ONE TABLET BY MOUTH AS DIRECTED. 40 tablet 3   No current facility-administered medications for this visit.    Allergies:   Penicillins and Tetanus toxoids    Social History:  The patient  reports that he quit smoking about 6 years ago. His smoking use included Cigarettes. He quit after 15 years of use. He has never used smokeless tobacco. He reports that he drinks about 1.2 - 1.8 oz of alcohol per week. He reports that he does not use illicit drugs.   Family History:  The patient's family history includes Alzheimer's disease in his brother, father, mother, and paternal uncle; Heart disease in his mother.    ROS:  Please see the history of present illness.   Otherwise, review of systems are positive for balance problems from parkinsons.   All other systems are reviewed and negative.    PHYSICAL EXAM: VS:  BP 120/64 mmHg  Pulse 51  Ht 5\' 7"  (1.702 m)  Wt 197 lb 12.8 oz (89.721 kg)  BMI 30.97 kg/m2  SpO2 95% , BMI Body mass index is 30.97 kg/(m^2). GEN: Well nourished, well developed, in no acute distress HEENT: normal Neck: no JVD, carotid bruits, or masses Cardiac: RRR; no murmurs, rubs, or gallops.  Chronic RLE edema 1+ Respiratory:  clear to auscultation bilaterally, normal work of breathing GI: soft, nontender, nondistended, + BS MS: no deformity or atrophy Skin: warm and dry, no rash Neuro:  Strength and sensation are intact Psych: euthymic mood, full affect   EKG:  EKG is not ordered today.    Recent Labs: 11/02/2014: ALT 8 11/12/2014: BUN 14; Creatinine 0.90    Lipid Panel    Component Value Date/Time   CHOL 109 11/02/2014 0846   TRIG 126.0 11/02/2014 0846   HDL 30.50* 11/02/2014 0846   CHOLHDL 4 11/02/2014 0846   VLDL 25.2 11/02/2014 0846   LDLCALC 53 11/02/2014 0846      Wt Readings from Last 3 Encounters:  04/28/15 197 lb 12.8 oz (89.721 kg)  03/07/15 196 lb (88.905 kg)  11/16/14 200 lb 3.2 oz (90.81 kg)    ASSESSMENT  AND PLAN:  1. ASCAD s/p CABG with no angina - I have told him it is ok to stop his ASA since he is having a lot of bruising and is on warfarin. 2. OSA using mouth appliance 3. Dyslipidemia - lipids are  at goal - continue Crestor/fish oil  - recheck FLP and ALT 4. HTN - controlled - continue Lisinopril/Bystolic/amlodipine  5. Atrial fibrillation - maintaining sinus bradycardia - continue Bystolic/warfarin    Current medicines are reviewed at length with the patient today.  The patient does not have concerns regarding medicines.  The following changes have been made:  no change  Labs/ tests ordered today: See above Assessment and Plan No orders of the defined types were placed in this encounter.     Disposition:   FU with me in 6 months  Signed, Sueanne Margarita, MD  04/28/2015 10:11 AM    [facility] [place], [place],   [address] Phone: [phone]; Fax: [phone]

## 2015-04-28 ENCOUNTER — Ambulatory Visit (INDEPENDENT_AMBULATORY_CARE_PROVIDER_SITE_OTHER): Payer: Medicare Other

## 2015-04-28 ENCOUNTER — Ambulatory Visit (INDEPENDENT_AMBULATORY_CARE_PROVIDER_SITE_OTHER): Payer: Medicare Other | Admitting: Cardiology

## 2015-04-28 ENCOUNTER — Encounter: Payer: Self-pay | Admitting: Cardiology

## 2015-04-28 VITALS — BP 120/64 | HR 51 | Ht 67.0 in | Wt 197.8 lb

## 2015-04-28 DIAGNOSIS — I2510 Atherosclerotic heart disease of native coronary artery without angina pectoris: Secondary | ICD-10-CM

## 2015-04-28 DIAGNOSIS — I2583 Coronary atherosclerosis due to lipid rich plaque: Principal | ICD-10-CM

## 2015-04-28 DIAGNOSIS — I10 Essential (primary) hypertension: Secondary | ICD-10-CM

## 2015-04-28 DIAGNOSIS — I4891 Unspecified atrial fibrillation: Secondary | ICD-10-CM | POA: Diagnosis not present

## 2015-04-28 DIAGNOSIS — Z5181 Encounter for therapeutic drug level monitoring: Secondary | ICD-10-CM

## 2015-04-28 DIAGNOSIS — G4733 Obstructive sleep apnea (adult) (pediatric): Secondary | ICD-10-CM | POA: Diagnosis not present

## 2015-04-28 DIAGNOSIS — E785 Hyperlipidemia, unspecified: Secondary | ICD-10-CM

## 2015-04-28 LAB — POCT INR: INR: 3.2

## 2015-04-28 NOTE — Addendum Note (Signed)
Addended by: Harland German A on: 04/28/2015 01:39 PM   Modules accepted: Orders, Medications

## 2015-04-28 NOTE — Patient Instructions (Addendum)
Medication Instructions:  Your physician has recommended you make the following change in your medication:  1) STOP ASPIRIN  Labwork: None  Testing/Procedures: None  Follow-Up: Your physician wants you to follow-up in: 6 months with Dr. Radford Pax. You will receive a reminder letter in the mail two months in advance. If you don't receive a letter, please call our office to schedule the follow-up appointment.   Any Other Special Instructions Will Be Listed Below (If Applicable).

## 2015-04-29 ENCOUNTER — Encounter: Payer: Self-pay | Admitting: Cardiology

## 2015-04-30 DIAGNOSIS — J069 Acute upper respiratory infection, unspecified: Secondary | ICD-10-CM | POA: Diagnosis not present

## 2015-05-05 ENCOUNTER — Ambulatory Visit: Payer: Medicare Other | Admitting: Physical Therapy

## 2015-05-05 ENCOUNTER — Ambulatory Visit: Payer: Medicare Other | Attending: Neurology | Admitting: Occupational Therapy

## 2015-05-05 ENCOUNTER — Ambulatory Visit: Payer: Medicare Other | Admitting: Speech Pathology

## 2015-05-05 DIAGNOSIS — R29898 Other symptoms and signs involving the musculoskeletal system: Secondary | ICD-10-CM | POA: Insufficient documentation

## 2015-05-05 DIAGNOSIS — D485 Neoplasm of uncertain behavior of skin: Secondary | ICD-10-CM | POA: Diagnosis not present

## 2015-05-05 DIAGNOSIS — R269 Unspecified abnormalities of gait and mobility: Secondary | ICD-10-CM | POA: Insufficient documentation

## 2015-05-05 DIAGNOSIS — R471 Dysarthria and anarthria: Secondary | ICD-10-CM | POA: Insufficient documentation

## 2015-05-05 DIAGNOSIS — M25611 Stiffness of right shoulder, not elsewhere classified: Secondary | ICD-10-CM | POA: Insufficient documentation

## 2015-05-05 DIAGNOSIS — D1801 Hemangioma of skin and subcutaneous tissue: Secondary | ICD-10-CM | POA: Diagnosis not present

## 2015-05-05 DIAGNOSIS — R279 Unspecified lack of coordination: Secondary | ICD-10-CM | POA: Insufficient documentation

## 2015-05-05 DIAGNOSIS — C44311 Basal cell carcinoma of skin of nose: Secondary | ICD-10-CM | POA: Diagnosis not present

## 2015-05-05 DIAGNOSIS — L821 Other seborrheic keratosis: Secondary | ICD-10-CM | POA: Diagnosis not present

## 2015-05-05 DIAGNOSIS — Z85828 Personal history of other malignant neoplasm of skin: Secondary | ICD-10-CM | POA: Diagnosis not present

## 2015-05-05 DIAGNOSIS — R258 Other abnormal involuntary movements: Secondary | ICD-10-CM | POA: Insufficient documentation

## 2015-05-05 DIAGNOSIS — R4701 Aphasia: Secondary | ICD-10-CM | POA: Insufficient documentation

## 2015-05-05 NOTE — Therapy (Signed)
[facility] [address] [place], [place], [address] Phone: [phone]   Fax:  [phone]  Speech Language Pathology Treatment  Patient Details  Name: Troy [name] MRN: [id number] Date of Birth: 05-23-1941 Referring Provider:  Jonathon Jordan, MD  Encounter Date: 05/05/2015    Past Medical History  Diagnosis Date  . Constipation   . Diabetes mellitus   . Facial nerve spasm     L eye, tx'd with botox  . Fecal impaction   . Hypertension   . Hypothyroidism   . Hyperlipidemia   . Diverticulosis   . Gout   . CKD (chronic kidney disease) stage 2, GFR 60-89 ml/min   . OSA (obstructive sleep apnea)     intolerant to CPAP followed by Dr. Lamonte Sakai  . AAA (abdominal aortic aneurysm)     with mural thrombus  . Hepatic steatosis   . Coronary artery disease 07/2009    severe 3 vessel ASCAD s/p CABG with LIMA to LAD, SVG to diag, SVG to PL  . PAF (paroxysmal atrial fibrillation)   . Chronic anticoagulation     Past Surgical History  Procedure Laterality Date  . Coronary artery bypass graft  07-2009  . Spine surgery  1998  . Eye surgery Left May  2013    Cataract  . Eye surgery Right June 2013    Cataract  . Abdominal aortic aneurysm repair  01-2010    aorto-bi-iliac Gore Excluder stent graft    There were no vitals filed for this visit.  Visit Diagnosis: Dysarthria                     Problem List Patient Active Problem List   Diagnosis Date Noted  . Parkinsonism 07/09/2014  . Mild cognitive impairment 07/09/2014  . Encounter for therapeutic drug monitoring 12/17/2013  . 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 Hypertension   . Hyperlipidemia   . CKD (chronic kidney disease) stage 2, GFR 60-89 ml/min   . OSA (obstructive sleep apnea)   . Hepatic steatosis   . Chronic anticoagulation   . Atrial fibrillation 08/28/2013  . AAA (abdominal aortic aneurysm) without  rupture 04/14/2013  . Aftercare following surgery of the circulatory system, [place] 04/14/2013  . Coronary artery disease 07/27/2009   Speech Therapy Parkinson's Disease Screen  Date: 05/05/15    Decibel Level today:65 dB  (WNL=70-72 dB) with sound level meter 30cm away from pt's mouth Pt's conversational volume has decreased since last treatment course  Pt has not experienced difficulty in swallowing warranting objective evaluation  Pt would benefit from the following speech therapy recommendations:  Speech-language eval for dysarthria    Please order ST evaluation    Lovvorn, Annye Rusk MS, CCC-SLP 05/05/2015, 8:31 AM  [facility] [address] [place], [place], [address] Phone: [phone]   Fax:  [phone]

## 2015-05-05 NOTE — Therapy (Signed)
[facility] [address] [place], [place], [address] Phone: [phone]   Fax:  [phone]  Patient Details  Name: Troy [name] MRN: [id number] Date of Birth: 08/24/1941 Referring Provider:  Star Age, MD  Encounter Date: 05/05/2015  Physical Therapy Parkinson's Disease Screen   Timed Up and Go test:13.68 sec  10 meter walk test: 2.65 ft/sec  5 time sit to stand test:12.84 sec  Patient would benefit from Physical Therapy evaluation due to decline in functional mobility and several falls since previous bout of therapy.  Pt agrees to request for physical therapy order; however, pt's wife is due to have TKR in the next month, so they will likely need to wait for her healing in order to schedule actual therapy visits.      Stedman [place] W. 05/05/2015, 8:01 AM  [facility]raz[facility] Butt., PT [facility] [address] [place], [place], [address] Phone: [phone]   Fax:  [phone]

## 2015-05-05 NOTE — Patient Instructions (Signed)
Tips for Talking with People who have Aphasia  . Say one thing at a time . Don't  rush - slow down, be patient . Reduce background noise . Relax - be natural . Use pen and paper . Write down key words . Draw diagrams or pictures . Don't pretend you understand . Ask what helps . Recap - check you both understand   Describing words  What group does it belong to?  What do I use it for?  Where can I find it?  What does it LOOK like?  What other words go with it?  What is the 1st sound of the word?  Many Ways to Communicate  Describe it Write it Draw it Gesture it Use related words  There's an App for that: Family Feud, Heads up, Stop-fun categories, What if, Conversation TherAPPy, Mix 2,    Cognitive Activities you can do at home:   - [place] (easy level)  - [place]  On your computer, tablet or phone: Insurance [email or web address] Merchandiser, retail Hour Chocolate Fix Sort it out Environmental consultant App Photo Quiz App MixTwo App What's the Word?App   Provided by: Durward Parcel, [phone]

## 2015-05-05 NOTE — Therapy (Signed)
[facility] [address] [place], [place], [address] Phone: [phone]   Fax:  [phone]  Patient Details  Name: Troy [name] MRN: [id number] Date of Birth: 1941/06/20 Referring Provider:  Jonathon Jordan, MD  Encounter Date: 05/05/2015 Occupational Therapy Parkinson's Disease Screen  Physical Performance Test item #2 (simulated eating):  20.53 secs  Physical Performance Test item #4 (donning/doffing jacket):  10.97  9-hole peg test:    RUE  29.88        LUE  40.94   Pt would benefit from occupational therapy evaluation due to changes in coordination  and ADLs. Therapist to request orders from M.D.  05/05/2015, 8:27 AM Theone Murdoch, OTR/L [phone] Phone: [phone] 11:27 AM 05/05/2015 [name] [address] [place], [place], [address] Phone: [phone]   Fax:  [phone]

## 2015-05-09 ENCOUNTER — Other Ambulatory Visit: Payer: Self-pay | Admitting: Neurology

## 2015-05-09 DIAGNOSIS — G20 Parkinson's disease: Secondary | ICD-10-CM

## 2015-05-09 NOTE — Progress Notes (Signed)
Referrals to OT, PT, ST placed.

## 2015-05-10 ENCOUNTER — Ambulatory Visit: Payer: Medicare Other

## 2015-05-10 DIAGNOSIS — R269 Unspecified abnormalities of gait and mobility: Secondary | ICD-10-CM | POA: Diagnosis not present

## 2015-05-10 DIAGNOSIS — R4701 Aphasia: Secondary | ICD-10-CM | POA: Diagnosis not present

## 2015-05-10 DIAGNOSIS — R29898 Other symptoms and signs involving the musculoskeletal system: Secondary | ICD-10-CM | POA: Diagnosis not present

## 2015-05-10 DIAGNOSIS — R258 Other abnormal involuntary movements: Secondary | ICD-10-CM | POA: Diagnosis not present

## 2015-05-10 DIAGNOSIS — R471 Dysarthria and anarthria: Secondary | ICD-10-CM | POA: Diagnosis not present

## 2015-05-10 DIAGNOSIS — R279 Unspecified lack of coordination: Secondary | ICD-10-CM | POA: Diagnosis not present

## 2015-05-10 DIAGNOSIS — M25611 Stiffness of right shoulder, not elsewhere classified: Secondary | ICD-10-CM | POA: Diagnosis not present

## 2015-05-11 ENCOUNTER — Other Ambulatory Visit: Payer: Self-pay | Admitting: Cardiology

## 2015-05-11 NOTE — Therapy (Signed)
[facility] [address] [place], [place], [address] Phone: [phone]   Fax:  [phone]  Speech Language Pathology Evaluation  Patient Details  Name: Troy [name] MRN: [id number] Date of Birth: Sep 19, 1941 Referring Provider:  Jonathon Jordan, MD  Encounter Date: 05/10/2015      End of Session - 05/11/15 1657    Visit Number 1   Number of Visits 16   Date for SLP Re-Evaluation 07/09/15   SLP Start Time 50   SLP Stop Time  1146   SLP Time Calculation (min) 41 min   Activity Tolerance Patient tolerated treatment well      Past Medical History  Diagnosis Date  . Constipation   . Diabetes mellitus   . Facial nerve spasm     L eye, tx'd with botox  . Fecal impaction   . Hypertension   . Hypothyroidism   . Hyperlipidemia   . Diverticulosis   . Gout   . CKD (chronic kidney disease) stage 2, GFR 60-89 ml/min   . OSA (obstructive sleep apnea)     intolerant to CPAP followed by Dr. Lamonte Sakai  . AAA (abdominal aortic aneurysm)     with mural thrombus  . Hepatic steatosis   . Coronary artery disease 07/2009    severe 3 vessel ASCAD s/p CABG with LIMA to LAD, SVG to diag, SVG to PL  . PAF (paroxysmal atrial fibrillation)   . Chronic anticoagulation     Past Surgical History  Procedure Laterality Date  . Coronary artery bypass graft  07-2009  . Spine surgery  1998  . Eye surgery Left May  2013    Cataract  . Eye surgery Right June 2013    Cataract  . Abdominal aortic aneurysm repair  01-2010    aorto-bi-iliac Gore Excluder stent graft    There were no vitals filed for this visit.  Visit Diagnosis: Dysarthria      Subjective Assessment - 05/10/15 1115    Subjective "People do ask him to repeat." (wife)   Patient is accompained by: Family member  wife            SLP Evaluation [facility] - 05/10/15 1116    SLP Visit Information   SLP Received On 05/10/15   Onset Date 2013   Medical Diagnosis  Parkinsonism   Prior Functional Status   Cognitive/Linguistic Baseline Baseline deficits  memory   Baseline deficit details Anomia noted after CABG x4 surgery 2010    Lives With Spouse   Cognition   Overall Cognitive Status Impaired/Different from baseline   Area of Impairment Memory   Auditory Comprehension   Overall Auditory Comprehension Appears within functional limits for tasks assessed   Expression   Primary Mode of Expression Verbal   Verbal Expression   Overall Verbal Expression Impaired   Level of Generative/Spontaneous Verbalization Conversation   Naming Impairment   Responsive --  average 6.3 items in simple categories   Interfering Components --  Parkinsonism   Effective Techniques Other (Comment)  reduced rate   Oral Motor/Sensory Function   Labial ROM Reduced left  due to Botox for hemifacial spasm   Labial Strength Within Functional Limits   Labial Coordination Reduced   Lingual Symmetry Within Functional Limits  very slight   Lingual Coordination Reduced   Facial ROM Reduced left   Facial Symmetry Left droop   Facial Strength Within Functional Limits   Motor Speech   Overall Motor Speech Impaired   Respiration  Within functional limits   Articulation Impaired   Level of Impairment Conversation  some decr due to reduced lt labial movement   Intelligibility Intelligible   Effective Techniques Increased vocal intensity     Seven minutes of conversational speech was reduced today, at average 67dB (WNL= average 70-72dB), when a sound level meter was placed 30 cm away from pt's mouth. Overall intelligibility for this listener in a quiet environment was approx 90%. Production of loud /a/ challenged the patient, and SLP attempted to shape this but was unsuccessful during this evaluation period and instead used loud "Hey!" as an alternative for pt to begin to feel abdominal push without laryngeal strain. Pt rated effort level for loud "HEY!" at 6/10 (10=maximal  effort).   After evaluation tasks, in simple conversation, pt was then asked to use the same amount of effort as with "hey!". Loudness average with this increased effort was 70dB (range of 65 to 72) with rare verbal cues for loudness. After approx three minutes, loudness was reduced to average 67dB. Pt would benefit from skilled ST in order to improve speech intelligibility and pt's QOL.          SLP Education - 05/10/15 1135    Education provided Yes   Education Details loud /a/ for homework   Person(s) Educated Patient;Spouse   Methods Explanation;Demonstration;Verbal cues   Comprehension Verbalized understanding;Returned demonstration;Tactile cues required          SLP Short Term Goals - 05/11/15 1707    SLP SHORT TERM GOAL #1   Title pt will demo 18/20 sentence responses with loudness >69dB   Time 4   Period Weeks   Status New   SLP SHORT TERM GOAL #2   Title pt will demo 5 minutes simple conversation at or above 70dB with rare min verbal cues   Time 4   Period Weeks   Status New   SLP SHORT TERM GOAL #3   Title pt will tell SLP 4 memory strategies   Time 4   Period Weeks   Status New          SLP Long Term Goals - 05/11/15 1709    SLP LONG TERM GOAL #1   Title pt will participate in 8 minutes mod complex conversation with rare nonverbal cues for loudness   Time 8   Period Weeks   Status New   SLP LONG TERM GOAL #2   Title when nonverbal cues for incr'e loudness are used by the listener, pt will increase loudness to average 69dB for remainder of message in 90% of opportunities   Time 8   Period Weeks   Status New   SLP LONG TERM GOAL #3   Title pt will demo incr'd utilization of memory strategies per pt/family report   Time 8   Period Weeks   Status New          Plan - 05/11/15 1658    Clinical Impression Statement 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 Skilled ST is needed to increase volume to improve speech intelligibility and thus pt's QOL.    Speech Therapy Frequency 2x / week   Duration --  8 weeks   Treatment/Interventions SLP instruction and feedback;Compensatory strategies;Internal/external aids;Patient/family education;Functional tasks;Cueing hierarchy   Potential to Achieve Goals Good   Potential Considerations Ability to learn/carryover information          G-Codes -  05/11/15 1718    Functional Assessment Tool Used noms-5   Functional Limitations Motor speech   Motor Speech Current Status [phone]) At least 20 percent but less than 40 percent impaired, limited or restricted   Motor Speech Goal Status ([id number]) At least 1 percent but less than 20 percent impaired, limited or restricted      Problem List Patient Active Problem List   Diagnosis Date Noted  . Parkinsonism 07/09/2014  . Mild cognitive impairment 07/09/2014  . Encounter for therapeutic drug monitoring 12/17/2013  . 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 Hypertension   . Hyperlipidemia   . CKD (chronic kidney disease) stage 2, GFR 60-89 ml/min   . OSA (obstructive sleep apnea)   . Hepatic steatosis   . Chronic anticoagulation   . Atrial fibrillation 08/28/2013  . AAA (abdominal aortic aneurysm) without rupture 04/14/2013  . Aftercare following surgery of the circulatory system, [place] 04/14/2013  . Coronary artery disease 07/27/2009    [facility] , MS, CCC-SLP  05/11/2015, 5:37 PM  [facility] [address] [place], [place], [address] Phone: [phone]   Fax:  [phone]

## 2015-05-16 DIAGNOSIS — H40013 Open angle with borderline findings, low risk, bilateral: Secondary | ICD-10-CM | POA: Diagnosis not present

## 2015-05-17 ENCOUNTER — Ambulatory Visit: Payer: Medicare Other

## 2015-05-17 DIAGNOSIS — M25611 Stiffness of right shoulder, not elsewhere classified: Secondary | ICD-10-CM | POA: Diagnosis not present

## 2015-05-17 DIAGNOSIS — R471 Dysarthria and anarthria: Secondary | ICD-10-CM

## 2015-05-17 DIAGNOSIS — R279 Unspecified lack of coordination: Secondary | ICD-10-CM | POA: Diagnosis not present

## 2015-05-17 DIAGNOSIS — R4701 Aphasia: Secondary | ICD-10-CM | POA: Diagnosis not present

## 2015-05-17 DIAGNOSIS — R29898 Other symptoms and signs involving the musculoskeletal system: Secondary | ICD-10-CM | POA: Diagnosis not present

## 2015-05-17 DIAGNOSIS — R258 Other abnormal involuntary movements: Secondary | ICD-10-CM | POA: Diagnosis not present

## 2015-05-17 NOTE — Therapy (Signed)
[facility] [address] [place], [place], [address] Phone: [phone]   Fax:  [phone]  Speech Language Pathology Treatment  Patient Details  Name: Troy [name] MRN: [id number] Date of Birth: 01/26/1941 Referring Provider:  Jonathon Jordan, MD  Encounter Date: 05/17/2015      End of Session - 05/17/15 1015    Visit Number 2   Number of Visits 16   Date for SLP Re-Evaluation 07/09/15   SLP Start Time 3   SLP Stop Time  1015   SLP Time Calculation (min) 40 min   Activity Tolerance Patient tolerated treatment well      Past Medical History  Diagnosis Date  . Constipation   . Diabetes mellitus   . Facial nerve spasm     L eye, tx'd with botox  . Fecal impaction   . Hypertension   . Hypothyroidism   . Hyperlipidemia   . Diverticulosis   . Gout   . CKD (chronic kidney disease) stage 2, GFR 60-89 ml/min   . OSA (obstructive sleep apnea)     intolerant to CPAP followed by Dr. Lamonte Sakai  . AAA (abdominal aortic aneurysm)     with mural thrombus  . Hepatic steatosis   . Coronary artery disease 07/2009    severe 3 vessel ASCAD s/p CABG with LIMA to LAD, SVG to diag, SVG to PL  . PAF (paroxysmal atrial fibrillation)   . Chronic anticoagulation     Past Surgical History  Procedure Laterality Date  . Coronary artery bypass graft  07-2009  . Spine surgery  1998  . Eye surgery Left May  2013    Cataract  . Eye surgery Right June 2013    Cataract  . Abdominal aortic aneurysm repair  01-2010    aorto-bi-iliac Gore Excluder stent graft    There were no vitals filed for this visit.  Visit Diagnosis: Dysarthria  Aphasia      Subjective Assessment - 05/17/15 0936    Subjective :Where I  have trouble is remembering names."   Patient is accompained by: Family member  wife               ADULT SLP TREATMENT - 05/17/15 0938    General Information   Behavior/Cognition Alert;Cooperative;Pleasant  mood   Treatment Provided   Treatment provided Cognitive-Linquistic   Pain Assessment   Pain Assessment No/denies pain   Cognitive-Linquistic Treatment   Treatment focused on Dysarthria   Skilled Treatment Simple divergent naming tasks with usual mod-max assistance from SLP after average 3 responses needed.  Pt's speech was consistently at 70dB with these word responses.   Assessment / Recommendations / Plan   Plan Continue with current plan of care   Progression Toward Goals   Progression toward goals Progressing toward goals          SLP Education - 05/17/15 1014    Education Details loud /a/   Person(s) Educated Patient;Spouse   Methods Explanation;Demonstration   Comprehension Verbalized understanding;Verbal cues required;Need further instruction          SLP Short Term Goals - 05/17/15 1016    SLP [place] #1   Title pt will demo 18/20 sentence responses with loudness >69dB   Time 4   Period Weeks   Status New   SLP SHORT TERM GOAL #2   Title pt will demo 5 minutes simple conversation at or above 70dB with rare min verbal cues   Time 4  Period Weeks   Status New   SLP SHORT TERM GOAL #3   Title pt will tell SLP 4 memory strategies   Time 4   Period Weeks   Status New          SLP Long Term Goals - 05/17/15 1016    SLP LONG TERM GOAL #1   Title pt will participate in 8 minutes mod complex conversation with rare nonverbal cues for loudness   Time 8   Period Weeks   Status New   SLP LONG TERM GOAL #2   Title when nonverbal cues for incr'e loudness are used by the listener, pt will increase loudness to average 69dB for remainder of message in 90% of opportunities   Time 8   Period Weeks   Status New   SLP LONG TERM GOAL #3   Title pt will demo incr'd utilization of memory strategies per pt/family report   Time 8   Period Weeks   Status New          Plan - 05/17/15 1015    Clinical Impression Statement Pt requires cont cues for anomia.  Speech is softer with more ocmplex linguistic material. Skilled ST needed to incr word finding and efficacy of conversation with family and friends.    Speech Therapy Frequency 2x / week   Duration --  8 weeks   Treatment/Interventions SLP instruction and feedback;Compensatory strategies;Internal/external aids;Patient/family education;Functional tasks;Cueing hierarchy   Potential to Achieve Goals Good   Potential Considerations Ability to learn/carryover information        Problem List Patient Active Problem List   Diagnosis Date Noted  . Parkinsonism 07/09/2014  . Mild cognitive impairment 07/09/2014  . Encounter for therapeutic drug monitoring 12/17/2013  . 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 Hypertension   . Hyperlipidemia   . CKD (chronic kidney disease) stage 2, GFR 60-89 ml/min   . OSA (obstructive sleep apnea)   . Hepatic steatosis   . Chronic anticoagulation   . Atrial fibrillation 08/28/2013  . AAA (abdominal aortic aneurysm) without rupture 04/14/2013  . Aftercare following surgery of the circulatory system, [place] 04/14/2013  . Coronary artery disease 07/27/2009    [facility] , MS, CCC-SLP  05/17/2015, 10:17 AM  [facility] [address] [place], [place], [address] Phone: [phone]   Fax:  [phone]

## 2015-05-17 NOTE — Patient Instructions (Signed)
  Please complete the assigned speech therapy homework and return it to your next session.  

## 2015-05-19 ENCOUNTER — Ambulatory Visit: Payer: Medicare Other | Admitting: Physical Therapy

## 2015-05-19 ENCOUNTER — Ambulatory Visit (INDEPENDENT_AMBULATORY_CARE_PROVIDER_SITE_OTHER): Payer: Medicare Other

## 2015-05-19 DIAGNOSIS — Z5181 Encounter for therapeutic drug level monitoring: Secondary | ICD-10-CM | POA: Diagnosis not present

## 2015-05-19 DIAGNOSIS — R293 Abnormal posture: Secondary | ICD-10-CM

## 2015-05-19 DIAGNOSIS — I4891 Unspecified atrial fibrillation: Secondary | ICD-10-CM

## 2015-05-19 DIAGNOSIS — M25611 Stiffness of right shoulder, not elsewhere classified: Secondary | ICD-10-CM | POA: Diagnosis not present

## 2015-05-19 DIAGNOSIS — R471 Dysarthria and anarthria: Secondary | ICD-10-CM | POA: Diagnosis not present

## 2015-05-19 DIAGNOSIS — R258 Other abnormal involuntary movements: Secondary | ICD-10-CM

## 2015-05-19 DIAGNOSIS — R269 Unspecified abnormalities of gait and mobility: Secondary | ICD-10-CM

## 2015-05-19 DIAGNOSIS — R4701 Aphasia: Secondary | ICD-10-CM | POA: Diagnosis not present

## 2015-05-19 DIAGNOSIS — R29898 Other symptoms and signs involving the musculoskeletal system: Secondary | ICD-10-CM | POA: Diagnosis not present

## 2015-05-19 DIAGNOSIS — R279 Unspecified lack of coordination: Secondary | ICD-10-CM | POA: Diagnosis not present

## 2015-05-19 LAB — POCT INR: INR: 2.8

## 2015-05-19 NOTE — Therapy (Signed)
[facility] [address] [place], [place], [address] Phone: [phone]   Fax:  [phone]  Physical Therapy Evaluation  Patient Details  Name: Troy [name] MRN: [id number] Date of Birth: 28-Jan-1941 Referring Provider:  Jonathon Jordan, MD  Encounter Date: 05/19/2015      PT End of Session - 05/19/15 1420    Visit Number 1   Number of Visits 9   Date for PT Re-Evaluation 07/18/15   Authorization Type Medicare G-code every 10th visit   PT Start Time 1318   PT Stop Time 1405   PT Time Calculation (min) 47 min   Equipment Utilized During Treatment Gait belt   Activity Tolerance Patient tolerated treatment well   Behavior During Therapy [facility] for tasks assessed/performed      Past Medical History  Diagnosis Date  . Constipation   . Diabetes mellitus   . Facial nerve spasm     L eye, tx'd with botox  . Fecal impaction   . Hypertension   . Hypothyroidism   . Hyperlipidemia   . Diverticulosis   . Gout   . CKD (chronic kidney disease) stage 2, GFR 60-89 ml/min   . OSA (obstructive sleep apnea)     intolerant to CPAP followed by Dr. Lamonte Sakai  . AAA (abdominal aortic aneurysm)     with mural thrombus  . Hepatic steatosis   . Coronary artery disease 07/2009    severe 3 vessel ASCAD s/p CABG with LIMA to LAD, SVG to diag, SVG to PL  . PAF (paroxysmal atrial fibrillation)   . Chronic anticoagulation     Past Surgical History  Procedure Laterality Date  . Coronary artery bypass graft  07-2009  . Spine surgery  1998  . Eye surgery Left May  2013    Cataract  . Eye surgery Right June 2013    Cataract  . Abdominal aortic aneurysm repair  01-2010    aorto-bi-iliac Gore Excluder stent graft    There were no vitals filed for this visit.  Visit Diagnosis:  Abnormality of gait  Abnormal posture  Bradykinesia      Subjective Assessment - 05/19/15 1322    Subjective Pt is a 74 year old male who presents to OP  PT status post PD screen several weeks ago.  He has had one fall in the past 6 months.  He does not use assistive device.  He is currently working out 2x/wk at [employer], mostly for strengthening activities.  He reports overall  improvement in functional stregnth since beginning [employer].  With PD screen on 05/05/15, PT notes slight decline in functional mobility with several falls since prvious bout of therapy.   Patient is accompained by: Family member  wife   Patient Stated Goals Pt's goal is to try to help keep Parkinson's symptoms at [place].   Currently in Pain? No/denies            [facility] PT Assessment - 05/19/15 1328    Assessment   Medical Diagnosis Parkinsonism   Precautions   Precautions Fall   Balance Screen   Has the patient fallen in the past 6 months Yes   How many times? 1  trying to get something out of the closet   Has the patient had a decrease in activity level because of a fear of falling?  No   Is the patient reluctant to leave their home because of a fear of falling?  No   Home Environment  Living Environment Private residence   Living Arrangements Spouse/significant other  Wife to have TKR in July   Available Help at Discharge Family   Type of [place] to enter   Entrance Stairs-Number of Steps 3   Entrance Stairs-Rails None   Home Layout Multi-level;Able to live on main level with bedroom/bathroom   Prior Function   Level of Independence Independent with household mobility without device;Independent with basic ADLs  Wife needs to assist with meds   Leisure Works out with trainer at [employer] 2x/wk    ROM / Strength   AROM / PROM / Strength Strength   Strength   Overall Strength Comments grossly tested lower extremities at least 4+/5; no gross deficits noted   Transfers   Transfers Sit to Stand;Stand to Sit   Sit to Stand 6: Modified independent (Device/Increase time);Five times sit to stand;From chair/3-in-1  5x sit<>stand:  13.32 seconds   Stand  to Sit 5: Supervision;To chair/3-in-1  slight uncontrolled descent into sitting   Ambulation/Gait   Ambulation/Gait Yes   Ambulation/Gait Assistance 5: Supervision;4: Min guard   Ambulation/Gait Assistance Details pt has increased forward flexed posture, with increased incidence of decreased foot clearance bilaterally, as 6MWT progresses.  Pt has 2 episodes of bumping into objects on R side.   Ambulation Distance (Feet) 1050 Feet   Assistive device None   Gait Pattern Step-through pattern;Decreased step length - right;Decreased step length - left;Decreased dorsiflexion - left;Decreased dorsiflexion - right;Left foot flat;Right foot flat;Shuffle;Decreased trunk rotation;Poor foot clearance - right;Poor foot clearance - left  decreased arm swing   Ambulation Surface Level;Indoor   6 Minute Walk- Baseline   6 Minute Walk- Baseline yes   HR (bpm) 76   02 Sat (%RA) 95 %   6 minute walk test results    Aerobic Endurance Distance Walked 1044   Endurance additional comments Rates 5 of 10 exertion level   Standardized Balance Assessment   Standardized Balance Assessment Four Square Step Test;Timed Up and Go Test;Dynamic Gait Index   Four Square Step Test Trial One;Trial Two   Dynamic Gait Index   Level Surface Mild Impairment   Change in Gait Speed Mild Impairment   Gait with Horizontal Head Turns Moderate Impairment   Gait with Vertical Head Turns Moderate Impairment   Gait and Pivot Turn Normal   Step Over Obstacle Mild Impairment   Step Around Obstacles Normal   Steps Normal   Total Score 17   Timed Up and Go Test   TUG Normal TUG   Normal TUG (seconds) 13.56   Cognitive TUG (seconds) 17.91   Four Square Step Test    Trial One  30.31   Trial Two 22.73                                PT Long Term Goals - 05/19/15 1428    PT LONG TERM GOAL #1   Title Pt will perform HEP with family supervision, for improved balance and gait.  Target 06/18/15   Time 4    Period Weeks   Status New   PT LONG TERM GOAL #2   Title Pt will improve Dynamic Gait Index score to at least 20/24 for decreased fall risk.   Time 4   Period Weeks   Status New   PT LONG TERM GOAL #3   Title Pt will improve TUG cognitive score to less  than or equal to 15 seconds for improved dual tasking with gait.   Time 4   Period Weeks   Status New   PT LONG TERM GOAL #4   Title Pt will improve 4-square step test by at least 5 seconds for improved stepping in variable directions   Time 4   Period Weeks   Status New   PT LONG TERM GOAL #5   Title Pt/family will verbalize understanding of fall prevention within home environment.   Time 4   Period Weeks   Status New               Plan - Jun 06, 2015 1421    Clinical Impression Statement Pt is a 74 year old male who presents to OP PT with diagnosis of Parkinsonism.  He has been seen for therapy greater than a year ago.  He currently is participating in community fitness with [employer].  He notes having at least 1 fall in the past 6 months, and at PD screen on 05/05/15, pt/wife noted slight functional decline since last bout of therapy.  Pt is at fall risk on eval today per Dynamic Gait Index and 4-square step test.  Pt is at fall risk per Timed UP and Go cognitive score of 17.91 seconds.  With 6MWT, pt noted to have increasing forward lean and increased incidence of shuffling gait as test progresses.  Pt presents with decreased timing and coordination of gait, decreased balance, postural abnormality, with difficulty with dual tasks, as well as bradykinesia.  Pt would benefit from skilled PT to address balance and gait and functional mobility to decrease fall risk.   Pt will benefit from skilled therapeutic intervention in order to improve on the following deficits Abnormal gait;Decreased balance;Difficulty walking;Postural dysfunction   Rehab Potential Good   PT Frequency 2x / week   PT Duration 4 weeks  Pt's wife to have knee surgery  06/12/15, so pt trying to get in before that date   PT Treatment/Interventions ADLs/Self Care Home Management;Functional mobility training;Gait training;Patient/family education;Neuromuscular re-education;Balance training;Therapeutic exercise;Therapeutic activities   PT Next Visit Plan Postural exercises, standing balance activities-stepping, weightshifting, PWR! Moves in standing and modified quadruped   Consulted and Agree with Plan of Care Patient;Family member/caregiver   Family Member Consulted wife          G-Codes - Jun 06, 2015 1431    Functional Assessment Tool Used TUG cognitive 17.91 seconds, DGI 17/24, 4-square step test 22.73 seconds   Functional Limitation Mobility: Walking and moving around   Mobility: Walking and Moving Around Current Status [phone]) At least 20 percent but less than 40 percent impaired, limited or restricted   Mobility: Walking and Moving Around Goal Status [phone]) At least 1 percent but less than 20 percent impaired, limited or restricted       Problem List Patient Active Problem List   Diagnosis Date Noted  . Parkinsonism 07/09/2014  . Mild cognitive impairment 07/09/2014  . Encounter for therapeutic drug monitoring 12/17/2013  . 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 Hypertension   . Hyperlipidemia   . CKD (chronic kidney disease) stage 2, GFR 60-89 ml/min   . OSA (obstructive sleep apnea)   . Hepatic steatosis   . Chronic anticoagulation   . Atrial fibrillation 08/28/2013  . AAA (abdominal aortic aneurysm) without rupture 04/14/2013  . Aftercare following surgery of the circulatory system, [place] 04/14/2013  . Coronary artery disease 07/27/2009    MARRIOTT,AMY W. 06-06-2015, 2:32 PM  MARRIOTT,AMY W.,  [facility] [address] [place], [place], [address] Phone: [phone]   Fax:  [phone]

## 2015-05-20 ENCOUNTER — Ambulatory Visit: Payer: Medicare Other

## 2015-05-20 DIAGNOSIS — R471 Dysarthria and anarthria: Secondary | ICD-10-CM

## 2015-05-20 DIAGNOSIS — R258 Other abnormal involuntary movements: Secondary | ICD-10-CM | POA: Diagnosis not present

## 2015-05-20 DIAGNOSIS — R4701 Aphasia: Secondary | ICD-10-CM

## 2015-05-20 DIAGNOSIS — M25611 Stiffness of right shoulder, not elsewhere classified: Secondary | ICD-10-CM | POA: Diagnosis not present

## 2015-05-20 DIAGNOSIS — R279 Unspecified lack of coordination: Secondary | ICD-10-CM | POA: Diagnosis not present

## 2015-05-20 DIAGNOSIS — R29898 Other symptoms and signs involving the musculoskeletal system: Secondary | ICD-10-CM | POA: Diagnosis not present

## 2015-05-20 NOTE — Therapy (Signed)
[facility] [address] [place], [place], [address] Phone: [phone]   Fax:  [phone]  Speech Language Pathology Treatment  Patient Details  Name: Troy [name] MRN: [id number] Date of Birth: 12-30-1940 Referring Provider:  Jonathon Jordan, MD  Encounter Date: 05/20/2015      End of Session - 05/20/15 1018    Visit Number 3   Number of Visits 16   Date for SLP Re-Evaluation 07/09/15   SLP Start Time 0933   SLP Stop Time  1015   SLP Time Calculation (min) 42 min   Activity Tolerance Patient tolerated treatment well      Past Medical History  Diagnosis Date  . Constipation   . Diabetes mellitus   . Facial nerve spasm     L eye, tx'd with botox  . Fecal impaction   . Hypertension   . Hypothyroidism   . Hyperlipidemia   . Diverticulosis   . Gout   . CKD (chronic kidney disease) stage 2, GFR 60-89 ml/min   . OSA (obstructive sleep apnea)     intolerant to CPAP followed by Dr. Lamonte Sakai  . AAA (abdominal aortic aneurysm)     with mural thrombus  . Hepatic steatosis   . Coronary artery disease 07/2009    severe 3 vessel ASCAD s/p CABG with LIMA to LAD, SVG to diag, SVG to PL  . PAF (paroxysmal atrial fibrillation)   . Chronic anticoagulation     Past Surgical History  Procedure Laterality Date  . Coronary artery bypass graft  07-2009  . Spine surgery  1998  . Eye surgery Left May  2013    Cataract  . Eye surgery Right June 2013    Cataract  . Abdominal aortic aneurysm repair  01-2010    aorto-bi-iliac Gore Excluder stent graft    There were no vitals filed for this visit.  Visit Diagnosis: Dysarthria  Aphasia      Subjective Assessment - 05/20/15 0935    Subjective "My sinuses are cleared up!"   Patient is accompained by: Family member  wife               ADULT SLP TREATMENT - 05/20/15 0938    General Information   Behavior/Cognition Alert;Cooperative;Pleasant mood   Treatment  Provided   Treatment provided Cognitive-Linquistic   Pain Assessment   Pain Assessment No/denies pain   Cognitive-Linquistic Treatment   Treatment focused on Dysarthria;Aphasia   Skilled Treatment Loud "Hey!" with average 81dB loudness.Told SLP descriptions of pictures in 6-step sequences with average loudness 70dB, adn self-corrected appropriately with lower than normal loudness. SLP engaged pt to work on anomia by convergent naming tasks with usual mod verbal cues necessary (mod complex). SLP encouraged pt to complete loud "Hey!" x10, twice a day.   Assessment / Recommendations / Plan   Plan Continue with current plan of care   Progression Toward Goals   Progression toward goals Progressing toward goals          SLP Education - 05/20/15 1017    Education provided Yes   Education Details loud HEY!   Person(s) Educated Patient;Spouse   Methods Explanation;Demonstration   Comprehension Verbalized understanding;Returned demonstration          SLP Short Term Goals - 05/20/15 1018    SLP SHORT TERM GOAL #1   Title pt will demo 18/20 sentence responses with loudness >69dB   Time 4   Period Weeks   Status On-going  SLP SHORT TERM GOAL #2   Title pt will demo 5 minutes simple conversation at or above 70dB with rare min verbal cues   Time 4   Period Weeks   Status On-going   SLP SHORT TERM GOAL #3   Title pt will tell SLP 4 memory strategies   Time 4   Period Weeks   Status On-going          SLP Long Term Goals - 05/20/15 1018    SLP LONG TERM GOAL #1   Title pt will participate in 8 minutes mod complex conversation with rare nonverbal cues for loudness   Time 8   Period Weeks   Status On-going   SLP LONG TERM GOAL #2   Title when nonverbal cues for incr'e loudness are used by the listener, pt will increase loudness to average 69dB for remainder of message in 90% of opportunities   Time 8   Period Weeks   Status On-going   SLP LONG TERM GOAL #3   Title pt will  demo incr'd utilization of memory strategies per pt/family report   Time 8   Period Weeks   Status On-going          Plan - 05/20/15 1018    Clinical Impression Statement Pt requires cont cues for anomia. Speech is softer with more ocmplex linguistic material. Skilled ST needed to incr word finding and efficacy of conversation with family and friends.    Speech Therapy Frequency 2x / week   Duration --  8 weeks   Treatment/Interventions SLP instruction and feedback;Compensatory strategies;Internal/external aids;Patient/family education;Functional tasks;Cueing hierarchy   Potential to Achieve Goals Good   Potential Considerations Ability to learn/carryover information        Problem List Patient Active Problem List   Diagnosis Date Noted  . Parkinsonism 07/09/2014  . Mild cognitive impairment 07/09/2014  . Encounter for therapeutic drug monitoring 12/17/2013  . 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 Hypertension   . Hyperlipidemia   . CKD (chronic kidney disease) stage 2, GFR 60-89 ml/min   . OSA (obstructive sleep apnea)   . Hepatic steatosis   . Chronic anticoagulation   . Atrial fibrillation 08/28/2013  . AAA (abdominal aortic aneurysm) without rupture 04/14/2013  . Aftercare following surgery of the circulatory system, [place] 04/14/2013  . Coronary artery disease 07/27/2009    [facility] , MS, CCC-SLP  05/20/2015, 10:19 AM  [facility] [address] [place], [place], [address] Phone: [phone]   Fax:  [phone]

## 2015-05-20 NOTE — Patient Instructions (Signed)
Complete loud "Hey!" 10 times, twice a day  Please complete the assigned speech therapy homework prior to your next session.

## 2015-05-22 ENCOUNTER — Encounter: Payer: Self-pay | Admitting: Pharmacist

## 2015-05-23 ENCOUNTER — Ambulatory Visit: Payer: Medicare Other | Admitting: Occupational Therapy

## 2015-05-23 ENCOUNTER — Encounter: Payer: Self-pay | Admitting: Occupational Therapy

## 2015-05-23 ENCOUNTER — Ambulatory Visit: Payer: Medicare Other

## 2015-05-23 DIAGNOSIS — M25611 Stiffness of right shoulder, not elsewhere classified: Secondary | ICD-10-CM | POA: Diagnosis not present

## 2015-05-23 DIAGNOSIS — R279 Unspecified lack of coordination: Secondary | ICD-10-CM | POA: Diagnosis not present

## 2015-05-23 DIAGNOSIS — R258 Other abnormal involuntary movements: Secondary | ICD-10-CM | POA: Diagnosis not present

## 2015-05-23 DIAGNOSIS — R471 Dysarthria and anarthria: Secondary | ICD-10-CM

## 2015-05-23 DIAGNOSIS — R29898 Other symptoms and signs involving the musculoskeletal system: Secondary | ICD-10-CM

## 2015-05-23 DIAGNOSIS — R4701 Aphasia: Secondary | ICD-10-CM | POA: Diagnosis not present

## 2015-05-23 NOTE — Therapy (Signed)
[facility] [address] [place], [place], [address] Phone: [phone]   Fax:  [phone]  Occupational Therapy Evaluation  Patient Details  Name: Troy [name] MRN: [id number] Date of Birth: November 12, 1941 Referring Provider:  Jonathon Jordan, MD  Encounter Date: 05/23/2015      OT End of Session - 05/23/15 1739    Visit Number 1   Number of Visits 17   Date for OT Re-Evaluation 07/22/15   Authorization Type Medicare/AARP--needs G code   Authorization - Visit Number 1   Authorization - Number of Visits 10   OT Start Time 0845   OT Stop Time 0930   OT Time Calculation (min) 45 min   Activity Tolerance Patient tolerated treatment well   Behavior During Therapy [facility] for tasks assessed/performed      Past Medical History  Diagnosis Date  . Constipation   . Diabetes mellitus   . Facial nerve spasm     L eye, tx'd with botox  . Fecal impaction   . Hypertension   . Hypothyroidism   . Hyperlipidemia   . Diverticulosis   . Gout   . CKD (chronic kidney disease) stage 2, GFR 60-89 ml/min   . OSA (obstructive sleep apnea)     intolerant to CPAP followed by Dr. Lamonte Sakai  . AAA (abdominal aortic aneurysm)     with mural thrombus  . Hepatic steatosis   . Coronary artery disease 07/2009    severe 3 vessel ASCAD s/p CABG with LIMA to LAD, SVG to diag, SVG to PL  . PAF (paroxysmal atrial fibrillation)   . Chronic anticoagulation     Past Surgical History  Procedure Laterality Date  . Coronary artery bypass graft  07-2009  . Spine surgery  1998  . Eye surgery Left May  2013    Cataract  . Eye surgery Right June 2013    Cataract  . Abdominal aortic aneurysm repair  01-2010    aorto-bi-iliac Gore Excluder stent graft    There were no vitals filed for this visit.  Visit Diagnosis:  Lack of coordination  Bradykinesia  Rigidity  Shoulder joint stiffness, right      Subjective Assessment - 05/23/15 0851    Subjective  "I can't remember how to tie a tie anymore"   Patient is accompained by: Family member  wife   Pertinent History Parkinson's disease   Limitations memory/cognitive impairment, Wife having upcoming surgery in mid-July (who is transportation)   Patient Stated Goals improve dexterity, writing   Currently in Pain? No/denies           [facility] OT Assessment - 05/23/15 0001    Assessment   Diagnosis Parkinsonism   Onset Date 05/05/15  therapy screens indicated that pt may need therapy   Prior Therapy PT in the past   Precautions   Precautions Fall   Balance Screen   Has the patient fallen in the past 6 months Yes   How many times? 1  in [place] expects to be discharged to: Private residence   Lives With Spouse   Prior Function   Level of Independence Independent with household mobility without device;Independent with basic ADLs   Vocation Retired  Chief Financial Officer   Leisure Works out with Clinical research associate at [employer] 2x/wk    ADL   Eating/Feeding --  multiple spills   Grooming Modified independent   [place] --  incr time   Lower  Body Bathing Increased time   Upper Body Dressing Increased time  unable to remember how to tie tie, difficulty reaching back   Lower Body Dressing Modified independent  min difficulty with belt   Toilet Tranfer Independent   Toileting - Clothing Manipulation Modified independent  rarely has difficulty with timing    Toileting -  Hygiene Modified Independent   IADL   Prior Level of Function Shopping typically performs together   Prior Level of Function Light Housekeeping wife performs   Light Housekeeping --  makes bed   Prior Level of Function Meal Prep wife prerformed previously   Education officer, environmental on family or friends for transportation  does not drive anymore   Medication Management Takes responsibility if medication is prepared in advance in seperate dosage   Prior Level of Function Financial  Management wife has always performs   Mobility   Mobility Status History of falls;Independent   Written Expression   Dominant Hand Right   Handwriting --  50-75%   Vision - History   Baseline Vision Bifocals  wears 2 difficulty reading glasses for computer/reading    Visual History Cataracts   Additional Comments also has hemi-facial spasm and gets botox with affects movement on L side of face   Vision Assessment   Comment denies diplopia, but difficulty with finding correct glasses to read, complicated by hemi-facial spasms and Botox can limit facial movement   Cognition   Overall Cognitive Status Impaired/Different from baseline  confusion/memory deficits   Memory Impaired  uses calendar, difficulty using w/ complex scheduling   Memory Impairment --  confusion regarding glasses, forgets names   Observation/Other Assessments   Standing Functional Reach Test R-13, L-9inches   Other Surveys  Select   Physical Performance Test   Yes   Simulated Eating Time (seconds) 14.97   Donning [place] Jacket Comments unable to perform today due to no jacket/gown available, timed 3 button fasten/unfasten in 51.47sec   Sensation   Additional Comments wife reports numbness in feet   Coordination   9 Hole Peg Test Right;Left   Right 9 Hole Peg Test 41.12   Left 9 Hole Peg Test 33.62   Box and Blocks R-39blocks, L-42blocks   Tone   Assessment Location Right Upper Extremity;Left Upper Extremity   ROM / Strength   AROM / PROM / Strength AROM   AROM   Overall AROM  Deficits   Overall AROM Comments decr ROM with RUE IR and soreness (approx 75%), mild decr RUE supination   RUE Tone   RUE Tone Mild   LUE Tone   LUE Tone --  no significant                         OT Education - 05/23/15 1352    Education provided Yes   Education Details use of alarms/timers for remembering to take medications, benefits/recommendation of 2 grab bars in shower   Person(s) Educated  Patient;Spouse   Methods Explanation   Comprehension Verbalized understanding          OT Short Term Goals - 05/23/15 1746    OT SHORT TERM GOAL #1   Title Pt will be indepndent be PD-specific HEP.--STGs 06/21/15   Time 4   Period Weeks   Status New   OT SHORT TERM GOAL #2   Title Pt will be able to write 2 simple sentences and signature with 75% legibility.   Baseline 50-75%   Time 4  Period Weeks   Status New   OT SHORT TERM GOAL #3   Title Pt will improve coordination for ADLs as shown by improving time on box and blocks test by at least 5 with RUE.   Baseline R-39, L-42 blocks   Time 4   Period Weeks   Status New   OT SHORT TERM GOAL #4   Title Pt will reports incr ease/no soreness with reaching back with RUE to assist with donning belt and adjusting clothing.   Time 4   Period Weeks   Status New   OT SHORT TERM GOAL #5   Title Pt/wife will verbalize understanding of appropriate community resources and ways to prevent future complications.   Time 4   Period Weeks   Status New           OT Long Term Goals - 05-29-15 1923    OT LONG TERM GOAL #1   Title Pt will verbalize understanding of strategies to incr ease, safety, and independence with ADLs prn.--check LTGs 07/22/15   Time 8   Period Weeks   Status New   OT LONG TERM GOAL #2   Title Pt will be able to write 2 simple sentences and signature with 95% legibility.   Baseline 50-75%   Time 8   Period Weeks   Status New   OT LONG TERM GOAL #3   Title Pt will coordination as shown as improving time on 9-hole peg test by at least 6sec with RUE.   Baseline R-41.12sec   Time 8   Period Weeks   Status New   OT LONG TERM GOAL #4   Title Pt will improve balance/functional reach for ADLs as shown by improving standing functional reach by at least 2 inches with LUE.   Baseline L-9 inches   Time 8   Period Weeks   Status New   OT LONG TERM GOAL #5   Title Pt will report only minimal spills when eating.    Time 8   Period Weeks   Status New   Long Term Additional Goals   Additional Long Term Goals Yes               Plan - 05/29/15 1739    Clinical Impression Statement Pt with Parkinsonism presents with rigidity, bradykinesia, decreased coordination, decreased R shoulder ROM, decreased balance for ADLs, cognitive deficits.  Pt would benefit from occupational therapy to address these deficits in order to improve safety, ease, and independence with ADLs as well as preventing future complications.   Pt will benefit from skilled therapeutic intervention in order to improve on the following deficits (Retired) Decreased balance;Impaired UE functional use;Impaired tone;Decreased range of motion;Decreased coordination;Decreased knowledge of use of DME;Decreased cognition;Decreased mobility   Rehab Potential Good   Clinical Impairments Affecting Rehab Potential memory/cognitive deficits   OT Frequency 2x / week   OT Duration 8 weeks  +eval   OT Treatment/Interventions Self-care/ADL training;Cryotherapy;Ultrasound;DME and/or AE instruction;Manual Therapy;Therapeutic activities;Passive range of motion;Patient/family education;Therapeutic exercises;Functional Mobility Training;Moist Heat;Neuromuscular education;Cognitive remediation/compensation;Therapeutic exercise   Plan initiate HEP, assess PPT#4   Recommended Other Services receiving PT, ST (ST addressing memory strategies)   Consulted and Agree with Plan of Care Patient          G-Codes - 29-May-2015 1934    Functional Assessment Tool Used 9-hole peg test:  R-41.12, box and blocks test:  R-39, spills with eating, 50-75% writing legibility   Functional Limitation Carrying, moving and handling objects   Carrying,  Moving and Handling Objects Current Status [id number]) At least 40 percent but less than 60 percent impaired, limited or restricted   Carrying, Moving and Handling Objects Goal Status (Z1245) At least 1 percent but less than 20 percent  impaired, limited or restricted      Problem List Patient Active Problem List   Diagnosis Date Noted  . Parkinsonism 07/09/2014  . Mild cognitive impairment 07/09/2014  . Encounter for therapeutic drug monitoring 12/17/2013  . 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 Hypertension   . Hyperlipidemia   . CKD (chronic kidney disease) stage 2, GFR 60-89 ml/min   . OSA (obstructive sleep apnea)   . Hepatic steatosis   . Chronic anticoagulation   . Atrial fibrillation 08/28/2013  . AAA (abdominal aortic aneurysm) without rupture 04/14/2013  . Aftercare following surgery of the circulatory system, [place] 04/14/2013  . Coronary artery disease 07/27/2009    [facility] 05/23/2015, 7:41 PM  [facility] [address] [place], [place], [address] Phone: [phone]   Fax:  [place], OTR/L 05/23/2015 7:41 PM

## 2015-05-23 NOTE — Therapy (Signed)
[facility] [address] [place], [place], [address] Phone: [phone]   Fax:  [phone]  Speech Language Pathology Treatment  Patient Details  Name: Troy [name] MRN: [id number] Date of Birth: 1941/05/05 Referring Provider:  Jonathon Jordan, MD  Encounter Date: 05/23/2015      End of Session - 05/23/15 0845    Visit Number 4   Number of Visits 16   Date for SLP Re-Evaluation 07/09/15   SLP Start Time 0801   SLP Stop Time  0846   SLP Time Calculation (min) 45 min   Activity Tolerance Patient tolerated treatment well      Past Medical History  Diagnosis Date  . Constipation   . Diabetes mellitus   . Facial nerve spasm     L eye, tx'd with botox  . Fecal impaction   . Hypertension   . Hypothyroidism   . Hyperlipidemia   . Diverticulosis   . Gout   . CKD (chronic kidney disease) stage 2, GFR 60-89 ml/min   . OSA (obstructive sleep apnea)     intolerant to CPAP followed by Dr. Lamonte Sakai  . AAA (abdominal aortic aneurysm)     with mural thrombus  . Hepatic steatosis   . Coronary artery disease 07/2009    severe 3 vessel ASCAD s/p CABG with LIMA to LAD, SVG to diag, SVG to PL  . PAF (paroxysmal atrial fibrillation)   . Chronic anticoagulation     Past Surgical History  Procedure Laterality Date  . Coronary artery bypass graft  07-2009  . Spine surgery  1998  . Eye surgery Left May  2013    Cataract  . Eye surgery Right June 2013    Cataract  . Abdominal aortic aneurysm repair  01-2010    aorto-bi-iliac Gore Excluder stent graft    There were no vitals filed for this visit.  Visit Diagnosis: Dysarthria  Aphasia      Subjective Assessment - 05/23/15 0805    Subjective "I didn't work on (the homework) this weekend."   Patient is accompained by: Family member  wife               ADULT SLP TREATMENT - 05/23/15 0807    General Information   Behavior/Cognition  Alert;Cooperative;Pleasant mood   Treatment Provided   Treatment provided Cognitive-Linquistic   Pain Assessment   Pain Assessment No/denies pain   Cognitive-Linquistic Treatment   Treatment focused on Dysarthria;Aphasia   Skilled Treatment Loud "HEY!" average 84dB loudness. Rare min A necessary for loudness x1. Convergent naming completed with rare min A. Diverent naming with abstract categories with consistent mod-max A for > average 2.5 itmes.     Assessment / Recommendations / Plan   Plan Continue with current plan of care   Progression Toward Goals   Progression toward goals Progressing toward goals            SLP Short Term Goals - 05/23/15 0846    SLP SHORT TERM GOAL #1   Title pt will demo 18/20 sentence responses with loudness >69dB   Time 3   Period Weeks   Status On-going   SLP SHORT TERM GOAL #2   Title pt will demo 5 minutes simple conversation at or above 70dB with rare min verbal cues   Time 3   Period Weeks   Status On-going   SLP SHORT TERM GOAL #3   Title pt will tell SLP 4 memory strategies   Time  3   Period Weeks   Status On-going          SLP Long Term Goals - 05/23/15 0846    SLP LONG TERM GOAL #1   Title pt will participate in 8 minutes mod complex conversation with rare nonverbal cues for loudness   Time 7   Period Weeks   Status On-going   SLP LONG TERM GOAL #2   Title when nonverbal cues for incr'e loudness are used by the listener, pt will increase loudness to average 69dB for remainder of message in 90% of opportunities   Time 7   Period Weeks   Status On-going   SLP LONG TERM GOAL #3   Title pt will demo incr'd utilization of memory strategies per pt/family report   Time 7   Period Weeks   Status On-going          Plan - 05/23/15 0845    Clinical Impression Statement Skilled ST remains necessary for pt's anomia and reduced volume, although volume appears slightlyimproved from evaluation.   Speech Therapy Frequency 2x / week    Duration --  7 weeks   Treatment/Interventions SLP instruction and feedback;Compensatory strategies;Internal/external aids;Patient/family education;Functional tasks;Cueing hierarchy   Potential to Achieve Goals Good   Potential Considerations Ability to learn/carryover information        Problem List Patient Active Problem List   Diagnosis Date Noted  . Parkinsonism 07/09/2014  . Mild cognitive impairment 07/09/2014  . Encounter for therapeutic drug monitoring 12/17/2013  . 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 Hypertension   . Hyperlipidemia   . CKD (chronic kidney disease) stage 2, GFR 60-89 ml/min   . OSA (obstructive sleep apnea)   . Hepatic steatosis   . Chronic anticoagulation   . Atrial fibrillation 08/28/2013  . AAA (abdominal aortic aneurysm) without rupture 04/14/2013  . Aftercare following surgery of the circulatory system, [place] 04/14/2013  . Coronary artery disease 07/27/2009    [facility] , MS, CCC-SLP  05/23/2015, 8:47 AM  [facility] [address] [place], [place], [address] Phone: [phone]   Fax:  [phone]

## 2015-05-25 ENCOUNTER — Ambulatory Visit: Payer: Medicare Other | Admitting: Occupational Therapy

## 2015-05-25 ENCOUNTER — Ambulatory Visit: Payer: Medicare Other

## 2015-05-25 ENCOUNTER — Ambulatory Visit: Payer: Medicare Other | Admitting: Physical Therapy

## 2015-05-25 DIAGNOSIS — R471 Dysarthria and anarthria: Secondary | ICD-10-CM

## 2015-05-25 DIAGNOSIS — R29898 Other symptoms and signs involving the musculoskeletal system: Secondary | ICD-10-CM | POA: Diagnosis not present

## 2015-05-25 DIAGNOSIS — R279 Unspecified lack of coordination: Secondary | ICD-10-CM | POA: Diagnosis not present

## 2015-05-25 DIAGNOSIS — R258 Other abnormal involuntary movements: Secondary | ICD-10-CM

## 2015-05-25 DIAGNOSIS — M25611 Stiffness of right shoulder, not elsewhere classified: Secondary | ICD-10-CM

## 2015-05-25 DIAGNOSIS — R4701 Aphasia: Secondary | ICD-10-CM

## 2015-05-25 DIAGNOSIS — R269 Unspecified abnormalities of gait and mobility: Secondary | ICD-10-CM

## 2015-05-25 NOTE — Therapy (Signed)
[facility] [address] [place], [place], [address] Phone: [phone]   Fax:  [phone]  Speech Language Pathology Treatment  Patient Details  Name: Troy [name] MRN: [id number] Date of Birth: [birth year] Referring Provider:  Jonathon Jordan, MD  Encounter Date: 05/25/2015      End of Session - 05/25/15 0929    Visit Number 5   Number of Visits 16   Date for SLP Re-Evaluation 07/09/15   SLP Start Time 0848   SLP Stop Time  0930   SLP Time Calculation (min) 42 min   Activity Tolerance Patient tolerated treatment well      Past Medical History  Diagnosis Date  . Constipation   . Diabetes mellitus   . Facial nerve spasm     L eye, tx'd with botox  . Fecal impaction   . Hypertension   . Hypothyroidism   . Hyperlipidemia   . Diverticulosis   . Gout   . CKD (chronic kidney disease) stage 2, GFR 60-89 ml/min   . OSA (obstructive sleep apnea)     intolerant to CPAP followed by Dr. Lamonte Sakai  . AAA (abdominal aortic aneurysm)     with mural thrombus  . Hepatic steatosis   . Coronary artery disease 07/2009    severe 3 vessel ASCAD s/p CABG with LIMA to LAD, SVG to diag, SVG to PL  . PAF (paroxysmal atrial fibrillation)   . Chronic anticoagulation     Past Surgical History  Procedure Laterality Date  . Coronary artery bypass graft  07-2009  . Spine surgery  1998  . Eye surgery Left May  2013    Cataract  . Eye surgery Right June 2013    Cataract  . Abdominal aortic aneurysm repair  01-2010    aorto-bi-iliac Gore Excluder stent graft    There were no vitals filed for this visit.  Visit Diagnosis: Aphasia  Dysarthria      Subjective Assessment - 05/25/15 0854    Subjective Pt reports working on homework in the evening yesterday adn had difficulty with word finding.   Patient is accompained by: Family member  wife               ADULT SLP TREATMENT - 05/25/15 0855    General Information    Behavior/Cognition Alert;Cooperative;Pleasant mood   Treatment Provided   Treatment provided Cognitive-Linquistic   Pain Assessment   Pain Assessment No/denies pain   Cognitive-Linquistic Treatment   Treatment focused on Dysarthria;Aphasia   Skilled Treatment Loud "Hey!" average 83dB. SLP facilitated shaping Hey into /a/, but pt cont to use vocal hyperfunction. In short "ah", pt without hyperfunction, so SLP told pt to continue practicing with loud (short) "ah!", in hopes of eventually getting patinet to loud, long /a/ without vocal hyperfunction. Reviewed compensations for anomia. In simple divergent naming tasks, SLP req'd to give mod-max cues after average 4 pt responses. In mod complex divergent naming (3 "parts" of an item), pt req'd mod-max A after average 1.5 items. Responses were at average 69dB.    Assessment / Recommendations / Plan   Plan Continue with current plan of care   Progression Toward Goals   Progression toward goals Progressing toward goals            SLP Short Term Goals - 05/25/15 0930    SLP SHORT TERM GOAL #1   Title pt will demo 18/20 sentence responses with loudness >69dB   Time 3   Period  Weeks   Status On-going   SLP SHORT TERM GOAL #2   Title pt will demo 5 minutes simple conversation at or above 70dB with rare min verbal cues   Time 3   Period Weeks   Status On-going   SLP SHORT TERM GOAL #3   Title pt will tell SLP 4 memory strategies   Time 3   Period Weeks   Status On-going          SLP Long Term Goals - 05/25/15 0930    SLP LONG TERM GOAL #1   Title pt will participate in 8 minutes mod complex conversation with rare nonverbal cues for loudness   Time 7   Period Weeks   Status On-going   SLP LONG TERM GOAL #2   Title when nonverbal cues for incr'e loudness are used by the listener, pt will increase loudness to average 69dB for remainder of message in 90% of opportunities   Time 7   Period Weeks   Status On-going   SLP LONG TERM  GOAL #3   Title pt will demo incr'd utilization of memory strategies per pt/family report   Time 7   Period Weeks   Status On-going          Plan - 05/25/15 0929    Clinical Impression Statement Skilled ST remains necessary for pt's anomia and reduced volume, although volume appears slightlyimproved from evaluation.    Speech Therapy Frequency 2x / week   Duration --  6 weeks   Treatment/Interventions SLP instruction and feedback;Compensatory strategies;Internal/external aids;Patient/family education;Functional tasks;Cueing hierarchy   Potential to Achieve Goals Good   Potential Considerations Ability to learn/carryover information        Problem List Patient Active Problem List   Diagnosis Date Noted  . Parkinsonism 07/09/2014  . Mild cognitive impairment 07/09/2014  . Encounter for therapeutic drug monitoring 12/17/2013  . 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 Hypertension   . Hyperlipidemia   . CKD (chronic kidney disease) stage 2, GFR 60-89 ml/min   . OSA (obstructive sleep apnea)   . Hepatic steatosis   . Chronic anticoagulation   . Atrial fibrillation 08/28/2013  . AAA (abdominal aortic aneurysm) without rupture 04/14/2013  . Aftercare following surgery of the circulatory system, [place] 04/14/2013  . Coronary artery disease 07/27/2009    [facility] , MS, CCC-SLP  05/25/2015, 9:30 AM  [facility] [address] [place], [place], [address] Phone: [phone]   Fax:  [phone]

## 2015-05-25 NOTE — Therapy (Signed)
Troy [name] [address] [place], [place], [address] Phone: [phone]   Fax:  [phone]  Physical Therapy Treatment  Patient Details  Name: Troy [name] MRN: [id number] Date of Birth: 1941/01/10 Referring Provider:  Jonathon Jordan, MD  Encounter Date: 05/25/2015      PT End of Session - 05/25/15 1056    Visit Number 2   Number of Visits 9   Date for PT Re-Evaluation 07/18/15   Authorization Type Medicare G-code every 10th visit   PT Start Time 0805   PT Stop Time 0848   PT Time Calculation (min) 43 min   Equipment Utilized During Treatment Gait belt   Activity Tolerance Patient tolerated treatment well   Behavior During Therapy [facility] for tasks assessed/performed      Past Medical History  Diagnosis Date  . Constipation   . Diabetes mellitus   . Facial nerve spasm     L eye, tx'd with botox  . Fecal impaction   . Hypertension   . Hypothyroidism   . Hyperlipidemia   . Diverticulosis   . Gout   . CKD (chronic kidney disease) stage 2, GFR 60-89 ml/min   . OSA (obstructive sleep apnea)     intolerant to CPAP followed by Dr. Lamonte Sakai  . AAA (abdominal aortic aneurysm)     with mural thrombus  . Hepatic steatosis   . Coronary artery disease 07/2009    severe 3 vessel ASCAD s/p CABG with LIMA to LAD, SVG to diag, SVG to PL  . PAF (paroxysmal atrial fibrillation)   . Chronic anticoagulation     Past Surgical History  Procedure Laterality Date  . Coronary artery bypass graft  07-2009  . Spine surgery  1998  . Eye surgery Left May  2013    Cataract  . Eye surgery Right June 2013    Cataract  . Abdominal aortic aneurysm repair  01-2010    aorto-bi-iliac Gore Excluder stent graft    There were no vitals filed for this visit.  Visit Diagnosis:  Bradykinesia  Rigidity  Abnormality of gait      Subjective Assessment - 05/25/15 0807    Subjective No falls, "nothing exciting" since eval last visit.    Patient is accompained by: Family member   Currently in Pain? No/denies      Neuro Re-education: Postural exercises in varied positions:  PWR! Up for posture in sitting, modified quadruped at chair, and in standing x 10 reps, each, with verbal and visual cues for technique and for intensity.  Standing weigthshifting exercises:  Step and weightshift in forward directions, side/diagonal direction and back direction with UE support at counter, x 10 reps each for improved stepping and weigthshifting activities.  Stagger stance rocking forward and back with 1 UE support and single arm swing, x 10 reps each side.  Trunk rotation x 5 reps on each side, with increased forward lean and unsteadiness.  Therapeutic Exercise: Seated SciFit stepper, level 2.2, 4 extremities, x 10 minutes, with cues for increased intensity of RPM >70.                           PT Education - 05/25/15 1055    Education provided Yes   Education Details HEP-PWR! UP in varied positions, standing HEP   Person(s) Educated Patient;Spouse   Methods Explanation;Demonstration;Handout   Comprehension Verbalized understanding;Returned demonstration  PT Long Term Goals - 05/19/15 1428    PT LONG TERM GOAL #1   Title Pt will perform HEP with family supervision, for improved balance and gait.  Target 06/18/15   Time 4   Period Weeks   Status New   PT LONG TERM GOAL #2   Title Pt will improve Dynamic Gait Index score to at least 20/24 for decreased fall risk.   Time 4   Period Weeks   Status New   PT LONG TERM GOAL #3   Title Pt will improve TUG cognitive score to less than or equal to 15 seconds for improved dual tasking with gait.   Time 4   Period Weeks   Status New   PT LONG TERM GOAL #4   Title Pt will improve 4-square step test by at least 5 seconds for improved stepping in variable directions   Time 4   Period Weeks   Status New   PT LONG TERM GOAL #5   Title Pt/family will  verbalize understanding of fall prevention within home environment.   Time 4   Period Weeks   Status New               Plan - 05/25/15 1056    Clinical Impression Statement Pt returns for therapy today and HEP initiated for posture and for standing balance activities.  Pt requires UE support for safety during standing exercises.  Pt does not currently have any additional appointments scheduled due to appt availabitlity/schedule conflicts, and pt's wife is due to have knee surgery in several weeks.  Will continue to monitor schedule to try to get patient in for at least HEP review.  Otherwise, will wait to here from patient regarding future scheduling.   Pt will benefit from skilled therapeutic intervention in order to improve on the following deficits Abnormal gait;Decreased balance;Difficulty walking;Postural dysfunction   Rehab Potential Good   PT Frequency 2x / week   PT Duration 4 weeks  Pt's wife to have knee surgery 06/12/15, so pt trying to get in before that date   PT Treatment/Interventions ADLs/Self Care Home Management;Functional mobility training;Gait training;Patient/family education;Neuromuscular re-education;Balance training;Therapeutic exercise;Therapeutic activities   PT Next Visit Plan Review HEP; continue balance, posture exercises; add trunk rotation to HEP if possible   Consulted and Agree with Plan of Care Patient;Family member/caregiver   Family Member Consulted wife        Problem List Patient Active Problem List   Diagnosis Date Noted  . Parkinsonism 07/09/2014  . Mild cognitive impairment 07/09/2014  . Encounter for therapeutic drug monitoring 12/17/2013  . 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 Hypertension   . Hyperlipidemia   . CKD (chronic kidney disease) stage 2, GFR 60-89 ml/min   . OSA (obstructive sleep apnea)   . Hepatic steatosis   . Chronic anticoagulation   . Atrial fibrillation 08/28/2013  . AAA (abdominal  aortic aneurysm) without rupture 04/14/2013  . Aftercare following surgery of the circulatory system, [place] 04/14/2013  . Coronary artery disease 07/27/2009    Troy [name] W. 05/25/2015, 11:00 AM  [facility]razier Butt., PT [place] [address] [place], [place], [address] Phone: [phone]   Fax:  [phone]

## 2015-05-25 NOTE — Therapy (Signed)
[facility] [address] [place], [place], [address] Phone: [phone]   Fax:  [phone]  Occupational Therapy Treatment  Patient Details  Name: Troy [name] MRN: [id number] Date of Birth: Feb 09, 1941 Referring Provider:  Jonathon Jordan, MD  Encounter Date: 05/25/2015      OT End of Session - 05/25/15 1421    Visit Number 2   Number of Visits 17   Date for OT Re-Evaluation 07/22/15   Authorization Type Medicare/AARP--needs G code   Authorization - Visit Number 2   Authorization - Number of Visits 10   OT Start Time 9675      Past Medical History  Diagnosis Date  . Constipation   . Diabetes mellitus   . Facial nerve spasm     L eye, tx'd with botox  . Fecal impaction   . Hypertension   . Hypothyroidism   . Hyperlipidemia   . Diverticulosis   . Gout   . CKD (chronic kidney disease) stage 2, GFR 60-89 ml/min   . OSA (obstructive sleep apnea)     intolerant to CPAP followed by Dr. Lamonte Sakai  . AAA (abdominal aortic aneurysm)     with mural thrombus  . Hepatic steatosis   . Coronary artery disease 07/2009    severe 3 vessel ASCAD s/p CABG with LIMA to LAD, SVG to diag, SVG to PL  . PAF (paroxysmal atrial fibrillation)   . Chronic anticoagulation     Past Surgical History  Procedure Laterality Date  . Coronary artery bypass graft  07-2009  . Spine surgery  1998  . Eye surgery Left May  2013    Cataract  . Eye surgery Right June 2013    Cataract  . Abdominal aortic aneurysm repair  01-2010    aorto-bi-iliac Gore Excluder stent graft    There were no vitals filed for this visit.  Visit Diagnosis:  Bradykinesia  Rigidity  Lack of coordination  Shoulder joint stiffness, right      Subjective Assessment - 05/25/15 1413    Pertinent History Parkinson's disease   Limitations memory/cognitive impairment, Wife having upcoming surgery in mid-July (who is transportation)   Currently in Pain?  No/denies            [facility] OT Assessment - 05/25/15 0001    Observation/Other Assessments   Observations PPT #4 (donning jacket)32.47 secs                  OT Treatments/Exercises (OP) - 05/25/15 0001    ADLs   ADL Comments Disucssion with pt/ wife regarding importance of overall fitness/ exercise. Encouraged pt to perform PWR! exercises at least 3-5 x week. Pt reports attnding [facility] for personal training 2x. Pt reports he has disontinued walking program currently due to recent fall.   Neurological Re-education Exercises   Reciprocal Movements Armbike x 6 mins, for conditioning, pt maintained 38-40 RPM           PWR ([place]) - 05/25/15 1433    PWR! exercises Moves in sitting;Moves in supine   PWR! Up 10   PWR! Rock 10   PWR! Twist 10   PWR! Step 10   Comments min v.c. demonstration   PWR! Up 20   PWR! Rock 20   PWR! Twist 20   PWR! Step 20   Comments min v.c./ demonstration               OT Short Term Goals - 05/23/15 1746  OT SHORT TERM GOAL #1   Title Pt will be indepndent be PD-specific HEP.--STGs 06/21/15   Time 4   Period Weeks   Status New   OT SHORT TERM GOAL #2   Title Pt will be able to write 2 simple sentences and signature with 75% legibility.   Baseline 50-75%   Time 4   Period Weeks   Status New   OT SHORT TERM GOAL #3   Title Pt will improve coordination for ADLs as shown by improving time on box and blocks test by at least 5 with RUE.   Baseline R-39, L-42 blocks   Time 4   Period Weeks   Status New   OT SHORT TERM GOAL #4   Title Pt will reports incr ease/no soreness with reaching back with RUE to assist with donning belt and adjusting clothing.   Time 4   Period Weeks   Status New   OT SHORT TERM GOAL #5   Title Pt/wife will verbalize understanding of appropriate community resources and ways to prevent future complications.   Time 4   Period Weeks   Status New           OT Long Term Goals - 05/23/15 1923    OT  LONG TERM GOAL #1   Title Pt will verbalize understanding of strategies to incr ease, safety, and independence with ADLs prn.--check LTGs 07/22/15   Time 8   Period Weeks   Status New   OT LONG TERM GOAL #2   Title Pt will be able to write 2 simple sentences and signature with 95% legibility.   Baseline 50-75%   Time 8   Period Weeks   Status New   OT LONG TERM GOAL #3   Title Pt will coordination as shown as improving time on 9-hole peg test by at least 6sec with RUE.   Baseline R-41.12sec   Time 8   Period Weeks   Status New   OT LONG TERM GOAL #4   Title Pt will improve balance/functional reach for ADLs as shown by improving standing functional reach by at least 2 inches with LUE.   Baseline L-9 inches   Time 8   Period Weeks   Status New   OT LONG TERM GOAL #5   Title Pt will report only minimal spills when eating.   Time 8   Period Weeks   Status New   Long Term Additional Goals   Additional Long Term Goals Yes               Plan - 05/25/15 1424    Clinical Impression Statement Pt is progressing towards goals. Pt/ wife verbalize understanding of supine and seated PWR! exercises.   Pt will benefit from skilled therapeutic intervention in order to improve on the following deficits (Retired) Decreased balance;Impaired UE functional use;Impaired tone;Decreased range of motion;Decreased coordination;Decreased knowledge of use of DME;Decreased cognition;Decreased mobility   Clinical Impairments Affecting Rehab Potential memory/cognitive deficits   OT Frequency 2x / week   OT Duration 8 weeks   OT Treatment/Interventions Self-care/ADL training;Cryotherapy;Ultrasound;DME and/or AE instruction;Manual Therapy;Therapeutic activities;Passive range of motion;Patient/family education;Therapeutic exercises;Functional Mobility Training;Moist Heat;Neuromuscular education;Cognitive remediation/compensation;Therapeutic exercise   Plan coordination/ handwriting   OT Home Exercise Plan  issued PWR! supine, seated 05/25/15   Recommended Other Services --   Consulted and Agree with Plan of Care Patient        Problem List Patient Active Problem List   Diagnosis Date Noted  . Parkinsonism  07/09/2014  . Mild cognitive impairment 07/09/2014  . Encounter for therapeutic drug monitoring 12/17/2013  . 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 Hypertension   . Hyperlipidemia   . CKD (chronic kidney disease) stage 2, GFR 60-89 ml/min   . OSA (obstructive sleep apnea)   . Hepatic steatosis   . Chronic anticoagulation   . Atrial fibrillation 08/28/2013  . AAA (abdominal aortic aneurysm) without rupture 04/14/2013  . Aftercare following surgery of the circulatory system, [place] 04/14/2013  . Coronary artery disease 07/27/2009    RINE,KATHRYN 05/25/2015, 3:19 PM Theone Murdoch, OTR/L [phone] Phone: [phone] 3:19 PM 05/25/2015 [facility] [address] [place], [place], [address] Phone: [phone]   Fax:  [phone]

## 2015-05-25 NOTE — Patient Instructions (Signed)
Provided the following handouts for pt's HEP: -PWR! Based handouts for PWR! Up in sitting, modified quadruped and in standing x 20 reps each -LSVT BIG based handouts for forward step and weightshift, side step and weightshift, back step and weightshift x 10 reps each, then standing rock and reach with UE support at one side, x 10 reps each position  To be performed once per day

## 2015-05-27 ENCOUNTER — Ambulatory Visit: Payer: Medicare Other | Attending: Neurology | Admitting: Physical Therapy

## 2015-05-27 DIAGNOSIS — R269 Unspecified abnormalities of gait and mobility: Secondary | ICD-10-CM | POA: Insufficient documentation

## 2015-05-27 DIAGNOSIS — M25611 Stiffness of right shoulder, not elsewhere classified: Secondary | ICD-10-CM | POA: Insufficient documentation

## 2015-05-27 DIAGNOSIS — R279 Unspecified lack of coordination: Secondary | ICD-10-CM | POA: Insufficient documentation

## 2015-05-27 DIAGNOSIS — R29898 Other symptoms and signs involving the musculoskeletal system: Secondary | ICD-10-CM | POA: Insufficient documentation

## 2015-05-27 DIAGNOSIS — R4701 Aphasia: Secondary | ICD-10-CM | POA: Diagnosis not present

## 2015-05-27 DIAGNOSIS — R471 Dysarthria and anarthria: Secondary | ICD-10-CM | POA: Insufficient documentation

## 2015-05-27 DIAGNOSIS — R258 Other abnormal involuntary movements: Secondary | ICD-10-CM | POA: Diagnosis not present

## 2015-05-27 DIAGNOSIS — R293 Abnormal posture: Secondary | ICD-10-CM | POA: Insufficient documentation

## 2015-05-27 NOTE — Patient Instructions (Signed)

## 2015-05-31 ENCOUNTER — Ambulatory Visit: Payer: Medicare Other | Admitting: Occupational Therapy

## 2015-05-31 ENCOUNTER — Ambulatory Visit: Payer: Medicare Other | Admitting: Speech Pathology

## 2015-05-31 DIAGNOSIS — R269 Unspecified abnormalities of gait and mobility: Secondary | ICD-10-CM | POA: Diagnosis not present

## 2015-05-31 DIAGNOSIS — R471 Dysarthria and anarthria: Secondary | ICD-10-CM

## 2015-05-31 DIAGNOSIS — R258 Other abnormal involuntary movements: Secondary | ICD-10-CM | POA: Diagnosis not present

## 2015-05-31 DIAGNOSIS — R279 Unspecified lack of coordination: Secondary | ICD-10-CM

## 2015-05-31 DIAGNOSIS — R293 Abnormal posture: Secondary | ICD-10-CM | POA: Diagnosis not present

## 2015-05-31 DIAGNOSIS — R29898 Other symptoms and signs involving the musculoskeletal system: Secondary | ICD-10-CM | POA: Diagnosis not present

## 2015-05-31 DIAGNOSIS — M25611 Stiffness of right shoulder, not elsewhere classified: Secondary | ICD-10-CM | POA: Diagnosis not present

## 2015-05-31 DIAGNOSIS — R4701 Aphasia: Secondary | ICD-10-CM

## 2015-05-31 NOTE — Patient Instructions (Signed)
PWR! Hands  With arms stretched out in front of you (elbows straight), perform the following:  PWR! Rock: Move wrists up and down [employer]! Twist: Twist palms up and down BIG  Then, start with elbows bent and hands closed.  PWR! Step: Touch index finger to thumb while keeping other fingers straight. Flick fingers out BIG (thumb out/straighten fingers). Repeat with other fingers. (Step your thumb to each finger).  PWR! Hands: Push hands out BIG. Elbows straight, wrists up, fingers open and spread apart BIG. (Can also perform by pushing down on table, chair, knees. Push above head, out to the side, behind you, in front of you.)   ** Make each movement big and deliberate so that you feel the movement.  Perform at least 10 repetitions 1x/day, but perform PWR! hands throughout the day when you are having trouble using your hands (picking up/manipulating small objects, writing, eating, typing, sewing, buttoning, etc.).   Coordination Exercises  Perform the following exercises for 20 minutes 1 times per day. Perform with both hand(s). Perform using big movements.   Flipping Cards: Place deck of cards on the table. Flip cards over by opening your hand big to grasp and then turn your palm up big.  Deal cards: Hold 1/2 or whole deck in your hand. Use thumb to push card off top of deck with one big push.  Flip card between each finger.  Rotate ball with fingertips: Pick up with fingers/thumb and move as much as you can with each turn/movement (clockwise and counter-clockwise).  Toss ball in the air and catch with the same hand: Toss big/high.  Pick up coins and stack one at a time: Pick up with big, intentional movements. Do not drag coin to the edge. (5-10 in a stack)  Pick up 5-10 coins one at a time and hold in palm. Then, move coins from palm to fingertips one at time and place in coin bank/container.  Fasten nuts/bolts or put on bottle caps: Turn as much/as big as you can with each  turn.

## 2015-05-31 NOTE — Therapy (Signed)
[facility] [address] [place], [place], [address] Phone: [phone]   Fax:  [phone]  Occupational Therapy Treatment  Patient Details  Name: Troy [name] MRN: [id number] Date of Birth: Mar 17, 1941 Referring Provider:  Jonathon Jordan, MD  Encounter Date: 05/31/2015      OT End of Session - 05/31/15 1630    Visit Number 3   Number of Visits 17   Date for OT Re-Evaluation 07/22/15   Authorization Type Medicare/AARP--needs G code   Authorization - Visit Number 3   Authorization - Number of Visits 10   OT Start Time 8315   OT Stop Time 1628   OT Time Calculation (min) 55 min   Activity Tolerance Patient tolerated treatment well   Behavior During Therapy [facility] for tasks assessed/performed      Past Medical History  Diagnosis Date  . Constipation   . Diabetes mellitus   . Facial nerve spasm     L eye, tx'd with botox  . Fecal impaction   . Hypertension   . Hypothyroidism   . Hyperlipidemia   . Diverticulosis   . Gout   . CKD (chronic kidney disease) stage 2, GFR 60-89 ml/min   . OSA (obstructive sleep apnea)     intolerant to CPAP followed by Dr. Lamonte Sakai  . AAA (abdominal aortic aneurysm)     with mural thrombus  . Hepatic steatosis   . Coronary artery disease 07/2009    severe 3 vessel ASCAD s/p CABG with LIMA to LAD, SVG to diag, SVG to PL  . PAF (paroxysmal atrial fibrillation)   . Chronic anticoagulation     Past Surgical History  Procedure Laterality Date  . Coronary artery bypass graft  07-2009  . Spine surgery  1998  . Eye surgery Left May  2013    Cataract  . Eye surgery Right June 2013    Cataract  . Abdominal aortic aneurysm repair  01-2010    aorto-bi-iliac Gore Excluder stent graft    There were no vitals filed for this visit.  Visit Diagnosis:  Bradykinesia  Rigidity  Lack of coordination      Subjective Assessment - 05/31/15 1541    Subjective  Pt reports stiffness in R  shoulder   Patient is accompained by: Family member  wife   Pertinent History Parkinson's disease   Limitations memory/cognitive impairment, Wife having upcoming surgery in mid-July (who is transportation)   Patient Stated Goals improve dexterity, writing   Currently in Pain? No/denies                      OT Treatments/Exercises (OP) - 05/31/15 0001    ADLs   Overall ADLs Began instruction on how PD-specific ex can help prevent future complications including incr rigidity, contractures, and pain.  Pt/wife verbalize understanding.                OT Education - 05/31/15 1633    Education Details PWR! Hands (basic 4), coordination HEP   Person(s) Educated Patient   Methods Explanation;Demonstration;Handout;Verbal cues;Tactile cues   Comprehension Verbalized understanding;Returned demonstration;Verbal cues required          OT Short Term Goals - 05/23/15 1746    OT SHORT TERM GOAL #1   Title Pt will be indepndent be PD-specific HEP.--STGs 06/21/15   Time 4   Period Weeks   Status New   OT SHORT TERM GOAL #2   Title Pt will be able  to write 2 simple sentences and signature with 75% legibility.   Baseline 50-75%   Time 4   Period Weeks   Status New   OT SHORT TERM GOAL #3   Title Pt will improve coordination for ADLs as shown by improving time on box and blocks test by at least 5 with RUE.   Baseline R-39, L-42 blocks   Time 4   Period Weeks   Status New   OT SHORT TERM GOAL #4   Title Pt will reports incr ease/no soreness with reaching back with RUE to assist with donning belt and adjusting clothing.   Time 4   Period Weeks   Status New   OT SHORT TERM GOAL #5   Title Pt/wife will verbalize understanding of appropriate community resources and ways to prevent future complications.   Time 4   Period Weeks   Status New           OT Long Term Goals - 05/23/15 1923    OT LONG TERM GOAL #1   Title Pt will verbalize understanding of strategies  to incr ease, safety, and independence with ADLs prn.--check LTGs 07/22/15   Time 8   Period Weeks   Status New   OT LONG TERM GOAL #2   Title Pt will be able to write 2 simple sentences and signature with 95% legibility.   Baseline 50-75%   Time 8   Period Weeks   Status New   OT LONG TERM GOAL #3   Title Pt will coordination as shown as improving time on 9-hole peg test by at least 6sec with RUE.   Baseline R-41.12sec   Time 8   Period Weeks   Status New   OT LONG TERM GOAL #4   Title Pt will improve balance/functional reach for ADLs as shown by improving standing functional reach by at least 2 inches with LUE.   Baseline L-9 inches   Time 8   Period Weeks   Status New   OT LONG TERM GOAL #5   Title Pt will report only minimal spills when eating.   Time 8   Period Weeks   Status New   Long Term Additional Goals   Additional Long Term Goals Yes               Plan - 05/31/15 1631    Clinical Impression Statement Pt progressing towards goals and demo good use of strategies with cues, but has confusion at times due to cognitive deficits.   Plan review PWR! moves in supine/sitting and PWR! hands   OT Home Exercise Plan issued PWR! supine, seated 05/25/15, 05/31/15:  PWR! hands (basic 4), coordination HEP issued    Consulted and Agree with Plan of Care Patient;Family member/caregiver   Family Member Consulted wife        Problem List Patient Active Problem List   Diagnosis Date Noted  . Parkinsonism 07/09/2014  . Mild cognitive impairment 07/09/2014  . Encounter for therapeutic drug monitoring 12/17/2013  . 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 Hypertension   . Hyperlipidemia   . CKD (chronic kidney disease) stage 2, GFR 60-89 ml/min   . OSA (obstructive sleep apnea)   . Hepatic steatosis   . Chronic anticoagulation   . Atrial fibrillation 08/28/2013  . AAA (abdominal aortic aneurysm) without rupture 04/14/2013  . Aftercare following  surgery of the circulatory system, [place] 04/14/2013  . Coronary artery disease 07/27/2009    [facility] 05/31/2015, 4:35  PM  [facility] [address] [place], [place], [address] Phone: [phone]   Fax:  [place], OTR/L 05/31/2015 4:36 PM

## 2015-05-31 NOTE — Therapy (Signed)
[facility] [address] [place], [place], [address] Phone: [phone]   Fax:  [phone]  Speech Language Pathology Treatment  Patient Details  Name: Troy [name] MRN: [id number] Date of Birth: [birth year] Referring Provider:  Jonathon Jordan, MD  Encounter Date: 05/31/2015      End of Session - 05/31/15 1455    Visit Number 6   Number of Visits 16   Date for SLP Re-Evaluation 07/09/15   SLP Start Time 9983   SLP Stop Time  1450   SLP Time Calculation (min) 47 min   Activity Tolerance Patient tolerated treatment well      Past Medical History  Diagnosis Date  . Constipation   . Diabetes mellitus   . Facial nerve spasm     L eye, tx'd with botox  . Fecal impaction   . Hypertension   . Hypothyroidism   . Hyperlipidemia   . Diverticulosis   . Gout   . CKD (chronic kidney disease) stage 2, GFR 60-89 ml/min   . OSA (obstructive sleep apnea)     intolerant to CPAP followed by Dr. Lamonte Sakai  . AAA (abdominal aortic aneurysm)     with mural thrombus  . Hepatic steatosis   . Coronary artery disease 07/2009    severe 3 vessel ASCAD s/p CABG with LIMA to LAD, SVG to diag, SVG to PL  . PAF (paroxysmal atrial fibrillation)   . Chronic anticoagulation     Past Surgical History  Procedure Laterality Date  . Coronary artery bypass graft  07-2009  . Spine surgery  1998  . Eye surgery Left May  2013    Cataract  . Eye surgery Right June 2013    Cataract  . Abdominal aortic aneurysm repair  01-2010    aorto-bi-iliac Gore Excluder stent graft    There were no vitals filed for this visit.  Visit Diagnosis: Dysarthria  Aphasia      Subjective Assessment - 05/31/15 1419    Subjective "I have trouble thinking of what I am going to say."   Patient is accompained by: Family member               ADULT SLP TREATMENT - 05/31/15 1420    General Information   Behavior/Cognition Alert;Cooperative;Pleasant  mood   Treatment Provided   Treatment provided Cognitive-Linquistic   Pain Assessment   Pain Assessment No/denies pain   Cognitive-Linquistic Treatment   Treatment focused on Dysarthria;Aphasia   Skilled Treatment Loud short /a/ average 83dB with min cues when hyperphonia occur. Facilitated loud volume and word finding generating multiple meaning sentences with some extended time and usual mod cues to generate sentence instead of definition. Pt named 8 items for category with usual mod semantic and phonemic cues.    Assessment / Recommendations / Plan   Plan Continue with current plan of care   Progression Toward Goals   Progression toward goals Progressing toward goals            SLP Short Term Goals - 05/31/15 1454    SLP SHORT TERM GOAL #1   Title pt will demo 18/20 sentence responses with loudness >69dB   Time 2   Period Weeks   Status On-going   SLP SHORT TERM GOAL #2   Title pt will demo 5 minutes simple conversation at or above 70dB with rare min verbal cues   Time 2   Period Weeks   Status On-going   SLP SHORT TERM  GOAL #3   Title pt will tell SLP 4 memory strategies   Time 2   Period Weeks   Status On-going          SLP Long Term Goals - 05/31/15 1455    SLP LONG TERM GOAL #1   Title pt will participate in 8 minutes mod complex conversation with rare nonverbal cues for loudness   Time 6   Period Weeks   Status On-going   SLP LONG TERM GOAL #2   Title when nonverbal cues for incr'e loudness are used by the listener, pt will increase loudness to average 69dB for remainder of message in 90% of opportunities   Time 6   Period Weeks   Status On-going   SLP LONG TERM GOAL #3   Title pt will demo incr'd utilization of memory strategies per pt/family report   Time 6   Period Weeks   Status On-going          Plan - 05/31/15 1453    Clinical Impression Statement Pt continues to required mod A for word finding and min A for adequte volume. Continue skilled  ST to maximize intelligibility and verba expression   Speech Therapy Frequency 2x / week   Treatment/Interventions SLP instruction and feedback;Compensatory strategies;Internal/external aids;Patient/family education;Functional tasks;Cueing hierarchy   Potential to Achieve Goals Good   Potential Considerations Ability to learn/carryover information        Problem List Patient Active Problem List   Diagnosis Date Noted  . Parkinsonism 07/09/2014  . Mild cognitive impairment 07/09/2014  . Encounter for therapeutic drug monitoring 12/17/2013  . 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 Hypertension   . Hyperlipidemia   . CKD (chronic kidney disease) stage 2, GFR 60-89 ml/min   . OSA (obstructive sleep apnea)   . Hepatic steatosis   . Chronic anticoagulation   . Atrial fibrillation 08/28/2013  . AAA (abdominal aortic aneurysm) without rupture 04/14/2013  . Aftercare following surgery of the circulatory system, [place] 04/14/2013  . Coronary artery disease 07/27/2009    Lovvorn, Annye Rusk  MS, CCC-SLP  05/31/2015, 2:56 PM  [facility] [address] [place], [place], [address] Phone: [phone]   Fax:  [phone]

## 2015-05-31 NOTE — Therapy (Signed)
[facility] [address] [place], [place], [address] Phone: [phone]   Fax:  [phone]  Physical Therapy Treatment  Patient Details  Name: Troy [name] MRN: [id number] Date of Birth: Nov 16, 1941 Referring Provider:  Jonathon Jordan, MD  Encounter Date: 05/27/2015      PT End of Session - 05/31/15 0835    Visit Number 3   Number of Visits 9   Date for PT Re-Evaluation 07/18/15   Authorization Type Medicare G-code every 10th visit   PT Start Time 1021   PT Stop Time 1101   PT Time Calculation (min) 40 min   Activity Tolerance Patient tolerated treatment well   Behavior During Therapy [facility] for tasks assessed/performed      Past Medical History  Diagnosis Date  . Constipation   . Diabetes mellitus   . Facial nerve spasm     L eye, tx'd with botox  . Fecal impaction   . Hypertension   . Hypothyroidism   . Hyperlipidemia   . Diverticulosis   . Gout   . CKD (chronic kidney disease) stage 2, GFR 60-89 ml/min   . OSA (obstructive sleep apnea)     intolerant to CPAP followed by Dr. Lamonte Sakai  . AAA (abdominal aortic aneurysm)     with mural thrombus  . Hepatic steatosis   . Coronary artery disease 07/2009    severe 3 vessel ASCAD s/p CABG with LIMA to LAD, SVG to diag, SVG to PL  . PAF (paroxysmal atrial fibrillation)   . Chronic anticoagulation     Past Surgical History  Procedure Laterality Date  . Coronary artery bypass graft  07-2009  . Spine surgery  1998  . Eye surgery Left May  2013    Cataract  . Eye surgery Right June 2013    Cataract  . Abdominal aortic aneurysm repair  01-2010    aorto-bi-iliac Gore Excluder stent graft    There were no vitals filed for this visit.  Visit Diagnosis:  Abnormality of gait  Bradykinesia  Abnormal posture      Subjective Assessment - 05/31/15 0834    Subjective Tried the exercises; feel like I need some good support   Currently in Pain? No/denies             Neuro Re-education Pt performed HEP given last visit-seated, modified standing and standing PWR! Up for improved posture, standing step and weightshift exercises at counter-forward, backward, and side directions, then stagger stance forward/back rocking.  Pt requires superivison and verbal cues for technique.                      PT Education - 05/31/15 0835    Education provided Yes   Education Details Review of HEP; fall prevention education   Person(s) Educated Patient;Spouse   Methods Explanation;Demonstration;Handout;Verbal cues   Comprehension Verbalized understanding;Returned demonstration;Verbal cues required             PT Long Term Goals - 05/19/15 1428    PT LONG TERM GOAL #1   Title Pt will perform HEP with family supervision, for improved balance and gait.  Target 06/18/15   Time 4   Period Weeks   Status New   PT LONG TERM GOAL #2   Title Pt will improve Dynamic Gait Index score to at least 20/24 for decreased fall risk.   Time 4   Period Weeks   Status New   PT LONG TERM GOAL #3  Title Pt will improve TUG cognitive score to less than or equal to 15 seconds for improved dual tasking with gait.   Time 4   Period Weeks   Status New   PT LONG TERM GOAL #4   Title Pt will improve 4-square step test by at least 5 seconds for improved stepping in variable directions   Time 4   Period Weeks   Status New   PT LONG TERM GOAL #5   Title Pt/family will verbalize understanding of fall prevention within home environment.   Time 4   Period Weeks   Status New               Plan - 05/31/15 3500    Clinical Impression Statement Pt able to be fit into cancelled slot for another visit prior to wife's surgery.  Reviewed HEP and pt requires verbal cues for proper technique.  It is evident that he and wife have been trying to perform HEP at home.  Discussed fall prevention.  Pt appears motivated to continue to perform HEP to address  balance and posture in addition to his community fitness routine.  Will hold on patient's treatments at this time until hear back after wife's knee surgery, unless any other appts become available.   Pt will benefit from skilled therapeutic intervention in order to improve on the following deficits Abnormal gait;Decreased balance;Difficulty walking;Postural dysfunction   Rehab Potential Good   PT Frequency 2x / week   PT Duration 4 weeks   PT Treatment/Interventions ADLs/Self Care Home Management;Functional mobility training;Gait training;Patient/family education;Neuromuscular re-education;Balance training;Therapeutic exercise;Therapeutic activities   PT Next Visit Plan Continued balance, posture exercises if patient is able to return-dependent upon wife's scheduled knee surgery   Consulted and Agree with Plan of Care Patient;Family member/caregiver   Family Member Consulted wife        Problem List Patient Active Problem List   Diagnosis Date Noted  . Parkinsonism 07/09/2014  . Mild cognitive impairment 07/09/2014  . Encounter for therapeutic drug monitoring 12/17/2013  . 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 Hypertension   . Hyperlipidemia   . CKD (chronic kidney disease) stage 2, GFR 60-89 ml/min   . OSA (obstructive sleep apnea)   . Hepatic steatosis   . Chronic anticoagulation   . Atrial fibrillation 08/28/2013  . AAA (abdominal aortic aneurysm) without rupture 04/14/2013  . Aftercare following surgery of the circulatory system, [place] 04/14/2013  . Coronary artery disease 07/27/2009    [name] W. 05/31/2015, 8:40 AM  Frazier Butt., PT [facility] [address] [place], [place], [address] Phone: [phone]   Fax:  [phone]

## 2015-06-02 ENCOUNTER — Ambulatory Visit: Payer: Medicare Other | Admitting: Occupational Therapy

## 2015-06-02 ENCOUNTER — Encounter: Payer: Self-pay | Admitting: Occupational Therapy

## 2015-06-02 ENCOUNTER — Ambulatory Visit: Payer: Medicare Other

## 2015-06-02 ENCOUNTER — Other Ambulatory Visit: Payer: Self-pay | Admitting: Cardiology

## 2015-06-02 DIAGNOSIS — R279 Unspecified lack of coordination: Secondary | ICD-10-CM

## 2015-06-02 DIAGNOSIS — R29898 Other symptoms and signs involving the musculoskeletal system: Secondary | ICD-10-CM | POA: Diagnosis not present

## 2015-06-02 DIAGNOSIS — R471 Dysarthria and anarthria: Secondary | ICD-10-CM

## 2015-06-02 DIAGNOSIS — R258 Other abnormal involuntary movements: Secondary | ICD-10-CM | POA: Diagnosis not present

## 2015-06-02 DIAGNOSIS — R293 Abnormal posture: Secondary | ICD-10-CM | POA: Diagnosis not present

## 2015-06-02 DIAGNOSIS — M25611 Stiffness of right shoulder, not elsewhere classified: Secondary | ICD-10-CM | POA: Diagnosis not present

## 2015-06-02 DIAGNOSIS — R4701 Aphasia: Secondary | ICD-10-CM

## 2015-06-02 DIAGNOSIS — R269 Unspecified abnormalities of gait and mobility: Secondary | ICD-10-CM | POA: Diagnosis not present

## 2015-06-02 NOTE — Therapy (Signed)
[facility] [address] [place], [place], [address] Phone: [phone]   Fax:  [phone]  Speech Language Pathology Treatment  Patient Details  Name: Troy [name] MRN: [id number] Date of Birth: 04/19/1941 Referring Provider:  Jonathon Jordan, MD  Encounter Date: 06/02/2015      End of Session - 06/02/15 1629    Visit Number 7   Number of Visits 16   Date for SLP Re-Evaluation 07/09/15   SLP Start Time 0934   SLP Stop Time  1017   SLP Time Calculation (min) 43 min   Activity Tolerance Patient tolerated treatment well      Past Medical History  Diagnosis Date  . Constipation   . Diabetes mellitus   . Facial nerve spasm     L eye, tx'd with botox  . Fecal impaction   . Hypertension   . Hypothyroidism   . Hyperlipidemia   . Diverticulosis   . Gout   . CKD (chronic kidney disease) stage 2, GFR 60-89 ml/min   . OSA (obstructive sleep apnea)     intolerant to CPAP followed by Dr. Lamonte Sakai  . AAA (abdominal aortic aneurysm)     with mural thrombus  . Hepatic steatosis   . Coronary artery disease 07/2009    severe 3 vessel ASCAD s/p CABG with LIMA to LAD, SVG to diag, SVG to PL  . PAF (paroxysmal atrial fibrillation)   . Chronic anticoagulation     Past Surgical History  Procedure Laterality Date  . Coronary artery bypass graft  07-2009  . Spine surgery  1998  . Eye surgery Left May  2013    Cataract  . Eye surgery Right June 2013    Cataract  . Abdominal aortic aneurysm repair  01-2010    aorto-bi-iliac Gore Excluder stent graft    There were no vitals filed for this visit.  Visit Diagnosis: Aphasia  Dysarthria      Subjective Assessment - 06/02/15 0943    Subjective Pt arrives today with question about where to purchase word finding activities - SLP encouraged crosswords   Patient is accompained by: Family member  wife               ADULT SLP TREATMENT - 06/02/15 0944    General  Information   Behavior/Cognition Alert;Cooperative;Pleasant mood   Treatment Provided   Treatment provided Cognitive-Linquistic   Pain Assessment   Pain Assessment 0-10   Pain Score 1    Pain Location rt elbow   Pain Descriptors / Indicators Sore   Pain Intervention(s) Monitored during session   Cognitive-Linquistic Treatment   Treatment focused on Aphasia;Dysarthria   Skilled Treatment Short loud /a/ sounds completed with average 82dB; SLP shaped short /a/ to 3 second loud /a/ to eventually facilitate longer /a/ sounds - pt demonstrated average 85dB with mild hyperfunctional voice. Mod complex conversation for 12 minutes with average 71dB. Loudness faded with pt anomia. In mod complex word finding tasks, pt req'd occasional min A from SLP (phonemic and semantic cues) for consistent and fluent verbalization of his message.   Assessment / Recommendations / Plan   Plan Continue with current plan of care   Progression Toward Goals   Progression toward goals Progressing toward goals            SLP Short Term Goals - 06/02/15 1631    SLP SHORT TERM GOAL #1   Title pt will demo 18/20 sentence responses with loudness >69dB  Status Achieved   SLP SHORT TERM GOAL #2   Title pt will demo 5 minutes simple conversation at or above 70dB with rare min verbal cues   Status Achieved   SLP SHORT TERM GOAL #3   Title pt will tell SLP 4 memory strategies   Time 2   Period Weeks   Status On-going   SLP SHORT TERM GOAL #4   Title pt will complete simple naming activities with 90% success and rare min A   Time 2   Period Weeks   Status New          SLP Long Term Goals - 06/02/15 1632    SLP LONG TERM GOAL #1   Title pt will participate in 8 minutes mod complex conversation with rare nonverbal cues for loudness   Time 6   Period Weeks   Status On-going   SLP LONG TERM GOAL #2   Title when nonverbal cues for incr'e loudness are used by the listener, pt will increase loudness to average  69dB for remainder of message in 90% of opportunities   Time 6   Period Weeks   Status On-going   SLP LONG TERM GOAL #3   Title pt will demo incr'd utilization of memory strategies per pt/family report   Time 6   Period Weeks   Status On-going   SLP LONG TERM GOAL #4   Title In mod complex conversation of 10 minutes pt will demo functional word finding skills   Time 6   Period Weeks   Status New          Plan - 06/02/15 1630    Clinical Impression Statement Pt continues to required mod A for word finding. Pt volume was improved today in conversation. Continue skilled ST to maximize intelligibility and verbal expression   Speech Therapy Frequency 2x / week   Duration --  6 weeks   Treatment/Interventions SLP instruction and feedback;Compensatory strategies;Internal/external aids;Patient/family education;Functional tasks;Cueing hierarchy   Potential to Achieve Goals Good   Potential Considerations Ability to learn/carryover information        Problem List Patient Active Problem List   Diagnosis Date Noted  . Parkinsonism 07/09/2014  . Mild cognitive impairment 07/09/2014  . Encounter for therapeutic drug monitoring 12/17/2013  . 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 Hypertension   . Hyperlipidemia   . CKD (chronic kidney disease) stage 2, GFR 60-89 ml/min   . OSA (obstructive sleep apnea)   . Hepatic steatosis   . Chronic anticoagulation   . Atrial fibrillation 08/28/2013  . AAA (abdominal aortic aneurysm) without rupture 04/14/2013  . Aftercare following surgery of the circulatory system, [place] 04/14/2013  . Coronary artery disease 07/27/2009    [facility] , [place], [place]  06/02/2015, 4:33 PM  [facility] [address] [place], [place], [address] Phone: [phone]   Fax:  [phone]

## 2015-06-02 NOTE — Therapy (Signed)
[facility] [address] [place], [place], [address] Phone: [phone]   Fax:  [phone]  Occupational Therapy Treatment  Patient Details  Name: Troy [name] MRN: [id number] Date of Birth: Mar 27, 1941 Referring Provider:  Jonathon Jordan, MD  Encounter Date: 06/02/2015      OT End of Session - 06/02/15 1808    Visit Number 4   Number of Visits 17   Date for OT Re-Evaluation 07/22/15   Authorization Type Medicare/[employer]ARP--needs G code   Authorization - Visit Number 4   Authorization - Number of Visits 10   OT Start Time 1324   OT Stop Time 1100   OT Time Calculation (min) 42 min   Activity Tolerance Patient tolerated treatment well   Behavior During Therapy [facility] for tasks assessed/performed      Past Medical History  Diagnosis Date  . Constipation   . Diabetes mellitus   . Facial nerve spasm     L eye, tx'd with botox  . Fecal impaction   . Hypertension   . Hypothyroidism   . Hyperlipidemia   . Diverticulosis   . Gout   . CKD (chronic kidney disease) stage 2, GFR 60-89 ml/min   . OSA (obstructive sleep apnea)     intolerant to CPAP followed by Dr. Lamonte Sakai  . AAA (abdominal aortic aneurysm)     with mural thrombus  . Hepatic steatosis   . Coronary artery disease 07/2009    severe 3 vessel ASCAD s/p CABG with LIMA to LAD, SVG to diag, SVG to PL  . PAF (paroxysmal atrial fibrillation)   . Chronic anticoagulation     Past Surgical History  Procedure Laterality Date  . Coronary artery bypass graft  07-2009  . Spine surgery  1998  . Eye surgery Left May  2013    Cataract  . Eye surgery Right June 2013    Cataract  . Abdominal aortic aneurysm repair  01-2010    aorto-bi-iliac Gore Excluder stent graft    There were no vitals filed for this visit.  Visit Diagnosis:  Bradykinesia  Rigidity  Lack of coordination      Subjective Assessment - 06/02/15 1813    Subjective  Pt reports stiffness in R  shoulder   Patient is accompained by: Family member  wife   Pertinent History Parkinson's disease   Limitations memory/cognitive impairment, Wife having upcoming surgery in mid-July (who is transportation)   Patient Stated Goals improve dexterity, writing                      OT Treatments/Exercises (OP) - 06/02/15 0001    ADLs   Overall ADLs Pt/wife instructed in how PD can affect vision as well as compensation strategies for vision/cognition to prevent falls, particularly on stairs including using visual cues/adding handrail, incr contrast, nightlights, etc.  Pt/wife verbalized understanding.   Writing Practiced using strategies with 95% legibility and min decr in size.  Pt responded well to cues and use of strategies.   Issued foam grip for pen for home use as this appeared to help with size.                OT Education - 06/02/15 1807    Education Details Review PWR! hands and coordination HEP, instructed in PD handwriting tips/strategies   Person(s) Educated Patient   Methods Explanation;Verbal cues;Handout;Demonstration   Comprehension Verbalized understanding;Returned demonstration;Verbal cues required  OT Short Term Goals - 05/23/15 1746    OT SHORT TERM GOAL #1   Title Pt will be indepndent be PD-specific HEP.--STGs 06/21/15   Time 4   Period Weeks   Status New   OT SHORT TERM GOAL #2   Title Pt will be able to write 2 simple sentences and signature with 75% legibility.   Baseline 50-75%   Time 4   Period Weeks   Status New   OT SHORT TERM GOAL #3   Title Pt will improve coordination for ADLs as shown by improving time on box and blocks test by at least 5 with RUE.   Baseline R-39, L-42 blocks   Time 4   Period Weeks   Status New   OT SHORT TERM GOAL #4   Title Pt will reports incr ease/no soreness with reaching back with RUE to assist with donning belt and adjusting clothing.   Time 4   Period Weeks   Status New   OT SHORT TERM  GOAL #5   Title Pt/wife will verbalize understanding of appropriate community resources and ways to prevent future complications.   Time 4   Period Weeks   Status New           OT Long Term Goals - 05/23/15 1923    OT LONG TERM GOAL #1   Title Pt will verbalize understanding of strategies to incr ease, safety, and independence with ADLs prn.--check LTGs 07/22/15   Time 8   Period Weeks   Status New   OT LONG TERM GOAL #2   Title Pt will be able to write 2 simple sentences and signature with 95% legibility.   Baseline 50-75%   Time 8   Period Weeks   Status New   OT LONG TERM GOAL #3   Title Pt will coordination as shown as improving time on 9-hole peg test by at least 6sec with RUE.   Baseline R-41.12sec   Time 8   Period Weeks   Status New   OT LONG TERM GOAL #4   Title Pt will improve balance/functional reach for ADLs as shown by improving standing functional reach by at least 2 inches with LUE.   Baseline L-9 inches   Time 8   Period Weeks   Status New   OT LONG TERM GOAL #5   Title Pt will report only minimal spills when eating.   Time 8   Period Weeks   Status New   Long Term Additional Goals   Additional Long Term Goals Yes               Plan - 06/02/15 1808    Clinical Impression Statement Pt progressing well with improved coordination/PWR! hands HEP performance and improved handwriting legibility with strategies.   Plan review PWR! moves in sitting/supine, begin instruction in ADL strategies as able   OT Home Exercise Plan issued PWR! supine, seated 05/25/15, 05/31/15:  PWR! hands (basic 4), coordination HEP issued    Consulted and Agree with Plan of Care Patient;Family member/caregiver   Family Member Consulted wife        Problem List Patient Active Problem List   Diagnosis Date Noted  . Parkinsonism 07/09/2014  . Mild cognitive impairment 07/09/2014  . Encounter for therapeutic drug monitoring 12/17/2013  . Endoleak post (EVAR) endovascular  aneurysm repair 11/10/2013  . Hypertension   . Hyperlipidemia   . CKD (chronic kidney disease) stage 2, GFR 60-89 ml/min   . OSA (obstructive  sleep apnea)   . Hepatic steatosis   . Chronic anticoagulation   . Atrial fibrillation 08/28/2013  . AAA (abdominal aortic aneurysm) without rupture 04/14/2013  . Aftercare following surgery of the circulatory system, [place] 04/14/2013  . Coronary artery disease 07/27/2009    [facility] 06/02/2015, 6:13 PM  [place] [address] [place], [place], [address] Phone: [phone]   Fax:  [place], OTR/L 06/02/2015 6:13 PM

## 2015-06-02 NOTE — Patient Instructions (Signed)
  Please complete the assigned speech therapy homework and return it to your next session.  

## 2015-06-07 ENCOUNTER — Ambulatory Visit: Payer: Medicare Other | Admitting: Speech Pathology

## 2015-06-07 ENCOUNTER — Ambulatory Visit: Payer: Medicare Other | Admitting: Occupational Therapy

## 2015-06-07 DIAGNOSIS — R29898 Other symptoms and signs involving the musculoskeletal system: Secondary | ICD-10-CM | POA: Diagnosis not present

## 2015-06-07 DIAGNOSIS — R471 Dysarthria and anarthria: Secondary | ICD-10-CM

## 2015-06-07 DIAGNOSIS — R258 Other abnormal involuntary movements: Secondary | ICD-10-CM | POA: Diagnosis not present

## 2015-06-07 DIAGNOSIS — R293 Abnormal posture: Secondary | ICD-10-CM | POA: Diagnosis not present

## 2015-06-07 DIAGNOSIS — R279 Unspecified lack of coordination: Secondary | ICD-10-CM | POA: Diagnosis not present

## 2015-06-07 DIAGNOSIS — M25611 Stiffness of right shoulder, not elsewhere classified: Secondary | ICD-10-CM

## 2015-06-07 DIAGNOSIS — R4701 Aphasia: Secondary | ICD-10-CM

## 2015-06-07 DIAGNOSIS — R269 Unspecified abnormalities of gait and mobility: Secondary | ICD-10-CM | POA: Diagnosis not present

## 2015-06-07 NOTE — Therapy (Addendum)
[facility] [address] [place], [place], [address] Phone: [phone]   Fax:  [phone]  Speech Language Pathology Treatment  Patient Details  Name: Troy [name] MRN: [id number] Date of Birth: [birth year] Referring Provider:  Jonathon Jordan, MD  Encounter Date: 06/07/2015      End of Session - 06/07/15 1028    Visit Number 8   Number of Visits 16   Date for SLP Re-Evaluation 07/09/15   SLP Start Time 0933   SLP Stop Time  1016   SLP Time Calculation (min) 43 min   Activity Tolerance Patient tolerated treatment well      Past Medical History  Diagnosis Date  . Constipation   . Diabetes mellitus   . Facial nerve spasm     L eye, tx'd with botox  . Fecal impaction   . Hypertension   . Hypothyroidism   . Hyperlipidemia   . Diverticulosis   . Gout   . CKD (chronic kidney disease) stage 2, GFR 60-89 ml/min   . OSA (obstructive sleep apnea)     intolerant to CPAP followed by Dr. Lamonte Sakai  . AAA (abdominal aortic aneurysm)     with mural thrombus  . Hepatic steatosis   . Coronary artery disease 07/2009    severe 3 vessel ASCAD s/p CABG with LIMA to LAD, SVG to diag, SVG to PL  . PAF (paroxysmal atrial fibrillation)   . Chronic anticoagulation     Past Surgical History  Procedure Laterality Date  . Coronary artery bypass graft  07-2009  . Spine surgery  1998  . Eye surgery Left May  2013    Cataract  . Eye surgery Right June 2013    Cataract  . Abdominal aortic aneurysm repair  01-2010    aorto-bi-iliac Gore Excluder stent graft    There were no vitals filed for this visit.  Visit Diagnosis: Aphasia  Dysarthria      Subjective Assessment - 06/07/15 0943    Subjective "We had a fun time with loud"               ADULT SLP TREATMENT - 06/07/15 0943    General Information   Behavior/Cognition Alert;Cooperative;Pleasant mood   Treatment Provided   Treatment provided  Cognitive-Linquistic   Pain Assessment   Pain Assessment No/denies pain   Cognitive-Linquistic Treatment   Treatment focused on Aphasia;Dysarthria   Skilled Treatment Short, loud /a/ with average of 83dB with min cues for loudness. Pt named 5 -7 items for mildly complex categories with usual mod cues. Trained pt in compensations for aphasia describing objects/activities for ST to guess with 70% accuracy and occassional min questioing cues.    Assessment / Recommendations / Plan   Plan Continue with current plan of care   Progression Toward Goals   Progression toward goals Progressing toward goals          SLP Education - 06/07/15 1010    Education provided Yes   Education Details Compensations for aphasia   Person(s) Educated Patient   Methods Explanation;Demonstration;Verbal cues   Comprehension Verbalized understanding;Returned demonstration;Verbal cues required          SLP Short Term Goals - 06/07/15 1027    SLP SHORT TERM GOAL #1   Title pt will demo 18/20 sentence responses with loudness >69dB   Status Achieved   SLP SHORT TERM GOAL #2   Title pt will demo 5 minutes simple conversation at or above 70dB with rare  min verbal cues   Status Achieved   SLP SHORT TERM GOAL #3   Title pt will tell SLP 4 memory strategies   Time 2   Period Weeks   Status On-going   SLP SHORT TERM GOAL #4   Title pt will complete simple naming activities with 90% success and rare min A   Time 1   Period Weeks   Status New          SLP Long Term Goals - 06/07/15 1028    SLP LONG TERM GOAL #1   Title pt will participate in 8 minutes mod complex conversation with rare nonverbal cues for loudness   Time 5   Period Weeks   Status On-going   SLP LONG TERM GOAL #2   Title when nonverbal cues for incr'e loudness are used by the listener, pt will increase loudness to average 69dB for remainder of message in 90% of opportunities   Time 5   Period Weeks   Status On-going   SLP LONG TERM  GOAL #3   Title pt will demo incr'd utilization of memory strategies per pt/family report   Time 5   Period Weeks   Status On-going   SLP LONG TERM GOAL #4   Title In mod complex conversation of 10 minutes pt will demo functional word finding skills   Time 5   Period Weeks   Status New          Plan - 06/07/15 1020    Clinical Impression Statement Volume is improved with occassional min A. Pt and spouse aware of activities to work on language enhancing activities at home as next session is pt's last session due to wife having knee surgery.     Speech Therapy Frequency 2x / week   Treatment/Interventions SLP instruction and feedback;Compensatory strategies;Internal/external aids;Patient/family education;Functional tasks;Cueing hierarchy   Potential to Achieve Goals Good   Potential Considerations Ability to learn/carryover information        Problem List Patient Active Problem List   Diagnosis Date Noted  . Parkinsonism 07/09/2014  . Mild cognitive impairment 07/09/2014  . Encounter for therapeutic drug monitoring 12/17/2013  . 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 Hypertension   . Hyperlipidemia   . CKD (chronic kidney disease) stage 2, GFR 60-89 ml/min   . OSA (obstructive sleep apnea)   . Hepatic steatosis   . Chronic anticoagulation   . Atrial fibrillation 08/28/2013  . AAA (abdominal aortic aneurysm) without rupture 04/14/2013  . Aftercare following surgery of the circulatory system, [place] 04/14/2013  . Coronary artery disease 07/27/2009    Lovvorn, Annye Rusk MS, CCC-SLP 06/07/2015, 10:29 AM  [facility] [address] [place], [place], [address] Phone: [phone]   Fax:  [phone]

## 2015-06-07 NOTE — Therapy (Signed)
[facility] [address] [place], [place], [address] Phone: [phone]   Fax:  [phone]  Occupational Therapy Treatment  Patient Details  Name: Troy [name] MRN: [id number] Date of Birth: August 21, 1941 Referring Provider:  Jonathon Jordan, MD  Encounter Date: 06/07/2015      OT End of Session - 06/07/15 1521    Visit Number 5   Number of Visits 17   Date for OT Re-Evaluation 07/22/15   Authorization Type Medicare/AARP--needs G code   Authorization - Visit Number 5   Authorization - Number of Visits 10   OT Start Time 0850   OT Stop Time 0933   OT Time Calculation (min) 43 min   Activity Tolerance Patient tolerated treatment well   Behavior During Therapy [facility] for tasks assessed/performed      Past Medical History  Diagnosis Date  . Constipation   . Diabetes mellitus   . Facial nerve spasm     L eye, tx'd with botox  . Fecal impaction   . Hypertension   . Hypothyroidism   . Hyperlipidemia   . Diverticulosis   . Gout   . CKD (chronic kidney disease) stage 2, GFR 60-89 ml/min   . OSA (obstructive sleep apnea)     intolerant to CPAP followed by Dr. Lamonte Sakai  . AAA (abdominal aortic aneurysm)     with mural thrombus  . Hepatic steatosis   . Coronary artery disease 07/2009    severe 3 vessel ASCAD s/p CABG with LIMA to LAD, SVG to diag, SVG to PL  . PAF (paroxysmal atrial fibrillation)   . Chronic anticoagulation     Past Surgical History  Procedure Laterality Date  . Coronary artery bypass graft  07-2009  . Spine surgery  1998  . Eye surgery Left May  2013    Cataract  . Eye surgery Right June 2013    Cataract  . Abdominal aortic aneurysm repair  01-2010    aorto-bi-iliac Gore Excluder stent graft    There were no vitals filed for this visit.  Visit Diagnosis:  Bradykinesia  Rigidity  Shoulder joint stiffness, right      Subjective Assessment - 06/07/15 1316    Subjective  "I feel like I  am moving better than I was a week and a half ago"  Pt/wife reports performing seated PWR! exercises and coordination/hand exercises every day which has helped   Patient is accompained by: Family member   Pertinent History Parkinson's disease   Limitations memory/cognitive impairment, Wife having upcoming surgery in mid-July (who is transportation)   Patient Stated Goals improve dexterity, writing   Currently in Pain? No/denies                              OT Education - 06/07/15 1317    Education Details Seated PWR! exercises in supine and sitting (basic 4); using/incorporating big movements for ADLs as prevention, strategies and incr carryover; functional sit>stand   Person(s) Educated Patient;Spouse   Methods Explanation;Demonstration   Comprehension Verbalized understanding;Returned demonstration;Verbal cues required  benefits from repetition due to memory deficits          OT Short Term Goals - 05/23/15 1746    OT SHORT TERM GOAL #1   Title Pt will be indepndent be PD-specific HEP.--STGs 06/21/15   Time 4   Period Weeks   Status New   OT SHORT TERM GOAL #2   Title  Pt will be able to write 2 simple sentences and signature with 75% legibility.   Baseline 50-75%   Time 4   Period Weeks   Status New   OT SHORT TERM GOAL #3   Title Pt will improve coordination for ADLs as shown by improving time on box and blocks test by at least 5 with RUE.   Baseline R-39, L-42 blocks   Time 4   Period Weeks   Status New   OT SHORT TERM GOAL #4   Title Pt will reports incr ease/no soreness with reaching back with RUE to assist with donning belt and adjusting clothing.   Time 4   Period Weeks   Status New   OT SHORT TERM GOAL #5   Title Pt/wife will verbalize understanding of appropriate community resources and ways to prevent future complications.   Time 4   Period Weeks   Status New           OT Long Term Goals - 05/23/15 1923    OT LONG TERM GOAL #1    Title Pt will verbalize understanding of strategies to incr ease, safety, and independence with ADLs prn.--check LTGs 07/22/15   Time 8   Period Weeks   Status New   OT LONG TERM GOAL #2   Title Pt will be able to write 2 simple sentences and signature with 95% legibility.   Baseline 50-75%   Time 8   Period Weeks   Status New   OT LONG TERM GOAL #3   Title Pt will coordination as shown as improving time on 9-hole peg test by at least 6sec with RUE.   Baseline R-41.12sec   Time 8   Period Weeks   Status New   OT LONG TERM GOAL #4   Title Pt will improve balance/functional reach for ADLs as shown by improving standing functional reach by at least 2 inches with LUE.   Baseline L-9 inches   Time 8   Period Weeks   Status New   OT LONG TERM GOAL #5   Title Pt will report only minimal spills when eating.   Time 8   Period Weeks   Status New   Long Term Additional Goals   Additional Long Term Goals Yes               Plan - 06/07/15 1522    Clinical Impression Statement Pt demo improved movement amplitude with cueing and repetition.  Pt will continue to need repetition and cueing for incr carry-over due to memory deficits.   Plan big movements with ADLs, check STGs? and place on hold until wife is cleared to drive (upcoming surgery)   OT Home Exercise Plan issued PWR! supine, seated 05/25/15, 05/31/15:  PWR! hands (basic 4), coordination HEP issued, 06/07/15:  big movements with ADLs   Consulted and Agree with Plan of Care Patient;Family member/caregiver   Family Member Consulted wife        Problem List Patient Active Problem List   Diagnosis Date Noted  . Parkinsonism 07/09/2014  . Mild cognitive impairment 07/09/2014  . Encounter for therapeutic drug monitoring 12/17/2013  . 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 Hypertension   . Hyperlipidemia   . CKD (chronic kidney disease) stage 2, GFR 60-89 ml/min   . OSA (obstructive sleep apnea)   .  Hepatic steatosis   . Chronic anticoagulation   . Atrial fibrillation 08/28/2013  . AAA (abdominal aortic aneurysm) without rupture 04/14/2013  .  Aftercare following surgery of the circulatory system, [place] 04/14/2013  . Coronary artery disease 07/27/2009    [facility] 06/07/2015, 3:50 PM  [facility] [address] [place], [place], [address] Phone: [phone]   Fax:  [place], OTR/L 06/07/2015 3:51 PM

## 2015-06-07 NOTE — Patient Instructions (Signed)

## 2015-06-07 NOTE — Patient Instructions (Signed)
Tips for Talking with People who have Aphasia  . Say one thing at a time . Don't  rush - slow down, be patient . Reduce background noise . Relax - be natural . Use pen and paper . Write down key words . Draw diagrams or pictures . Don't pretend you understand . Ask what helps . Recap - check you both understand   Describing words  What group does it belong to?  What do I use it for?  Where can I find it?  What does it LOOK like?  What other words go with it?  What is the 1st sound of the word?  Many Ways to Communicate  Describe it Write it Draw it Gesture it Use related words  There's an App for that: Family Feud, Heads up, Stop-fun categories, What if, Noe Gens  Provided by: Barnabas Lister [place], [phone]

## 2015-06-08 ENCOUNTER — Other Ambulatory Visit: Payer: Self-pay | Admitting: Cardiology

## 2015-06-10 ENCOUNTER — Ambulatory Visit: Payer: Medicare Other

## 2015-06-10 ENCOUNTER — Ambulatory Visit: Payer: Medicare Other | Admitting: Occupational Therapy

## 2015-06-10 DIAGNOSIS — R258 Other abnormal involuntary movements: Secondary | ICD-10-CM

## 2015-06-10 DIAGNOSIS — R279 Unspecified lack of coordination: Secondary | ICD-10-CM

## 2015-06-10 DIAGNOSIS — M25611 Stiffness of right shoulder, not elsewhere classified: Secondary | ICD-10-CM | POA: Diagnosis not present

## 2015-06-10 DIAGNOSIS — R29898 Other symptoms and signs involving the musculoskeletal system: Secondary | ICD-10-CM | POA: Diagnosis not present

## 2015-06-10 DIAGNOSIS — R4701 Aphasia: Secondary | ICD-10-CM

## 2015-06-10 DIAGNOSIS — R471 Dysarthria and anarthria: Secondary | ICD-10-CM

## 2015-06-10 DIAGNOSIS — R293 Abnormal posture: Secondary | ICD-10-CM | POA: Diagnosis not present

## 2015-06-10 DIAGNOSIS — R269 Unspecified abnormalities of gait and mobility: Secondary | ICD-10-CM | POA: Diagnosis not present

## 2015-06-10 NOTE — Therapy (Signed)
[facility] [address] [place], [place], [address] Phone: [phone]   Fax:  [phone]  Occupational Therapy Treatment  Patient Details  Name: Troy [name] MRN: [id number] Date of Birth: [birth year] Referring Provider:  Jonathon Jordan, MD  Encounter Date: 06/10/2015      OT End of Session - 06/10/15 1425    Visit Number 6   Number of Visits 17   Date for OT Re-Evaluation 07/22/15   Authorization Type Medicare/AARP--needs G code   Authorization - Visit Number 6   Authorization - Number of Visits 10   OT Start Time 4656   OT Stop Time 1445   OT Time Calculation (min) 38 min   Activity Tolerance Patient tolerated treatment well   Behavior During Therapy [facility] for tasks assessed/performed      Past Medical History  Diagnosis Date  . Constipation   . Diabetes mellitus   . Facial nerve spasm     L eye, tx'd with botox  . Fecal impaction   . Hypertension   . Hypothyroidism   . Hyperlipidemia   . Diverticulosis   . Gout   . CKD (chronic kidney disease) stage 2, GFR 60-89 ml/min   . OSA (obstructive sleep apnea)     intolerant to CPAP followed by Dr. Lamonte Sakai  . AAA (abdominal aortic aneurysm)     with mural thrombus  . Hepatic steatosis   . Coronary artery disease 07/2009    severe 3 vessel ASCAD s/p CABG with LIMA to LAD, SVG to diag, SVG to PL  . PAF (paroxysmal atrial fibrillation)   . Chronic anticoagulation     Past Surgical History  Procedure Laterality Date  . Coronary artery bypass graft  07-2009  . Spine surgery  1998  . Eye surgery Left May  2013    Cataract  . Eye surgery Right June 2013    Cataract  . Abdominal aortic aneurysm repair  01-2010    aorto-bi-iliac Gore Excluder stent graft    There were no vitals filed for this visit.  Visit Diagnosis:  Bradykinesia  Rigidity  Shoulder joint stiffness, right  Lack of coordination      Subjective Assessment - 06/10/15 1412    Subjective  Pt/ wife are putting therapy on hold until after surgery   Patient is accompained by: Family member   Pertinent History Parkinson's disease   Limitations memory/cognitive impairment, Wife having upcoming surgery in mid-July (who is transportation)   Patient Stated Goals improve dexterity, writing   Currently in Pain? No/denies        Treatment: Self care: Bag exercises for simulated ADLS( overhead, behind back, crumpling, under foot) with large amplitude movements, min v.c. demonstration  Arm bike x 5 mins, pt able to maintain, 38 RPM- 50 RPM. Therapist checked progress towards short term goals. Pt brought in his coordination HEP kit. Pt practiced stacking, picking up coins with big movements.                  PWR [facility]) - 06/10/15 1444    PWR! exercises Moves in sitting   PWR! Up 10    PWR! Rock 10   PWR! Twist 10   PWR! Step 10   Comments min v.c./ demonstration               OT Short Term Goals - 06/10/15 1414    OT SHORT TERM GOAL #1   Title Pt will be indepndent be PD-specific HEP.--STGs  06/21/15   Baseline met for inital HEP, pt/ wife verbalize understanding   Time 4   Period Weeks   Status Achieved   OT SHORT TERM GOAL #2   Title Pt will be able to write 2 simple sentences and signature with 75% legibility.   Baseline 75 % legibility- 06/10/15   Time 4   Period Weeks   Status Achieved   OT SHORT TERM GOAL #3   Title Pt will improve coordination for ADLs as shown by improving time on box and blocks test by at least 5 with RUE.   Baseline RU 47 blocks   Time 4   Period Weeks   Status Achieved   OT SHORT TERM GOAL #4   Title Pt will reports incr ease/no soreness with reaching back with RUE to assist with donning belt and adjusting clothing.   Baseline not met, continued soreness   Time 4   Period Weeks   Status On-going   OT SHORT TERM GOAL #5   Title Pt/wife will verbalize understanding of appropriate community resources and ways  to prevent future complications.   Time 4   Period Weeks   Status On-going           OT Long Term Goals - 05/23/15 1923    OT LONG TERM GOAL #1   Title Pt will verbalize understanding of strategies to incr ease, safety, and independence with ADLs prn.--check LTGs 07/22/15   Time 8   Period Weeks   Status New   OT LONG TERM GOAL #2   Title Pt will be able to write 2 simple sentences and signature with 95% legibility.   Baseline 50-75%   Time 8   Period Weeks   Status New   OT LONG TERM GOAL #3   Title Pt will coordination as shown as improving time on 9-hole peg test by at least 6sec with RUE.   Baseline R-41.12sec   Time 8   Period Weeks   Status New   OT LONG TERM GOAL #4   Title Pt will improve balance/functional reach for ADLs as shown by improving standing functional reach by at least 2 inches with LUE.   Baseline L-9 inches   Time 8   Period Weeks   Status New   OT LONG TERM GOAL #5   Title Pt will report only minimal spills when eating.   Time 8   Period Weeks   Status New   Long Term Additional Goals   Additional Long Term Goals Yes               Plan - 06/10/15 1422    Clinical Impression Statement Pt is progressing towards goals yet has not fully met STG's as he has only been seen for 6 visits. Pt placed on hold until after wfe's surgery.   Pt will benefit from skilled therapeutic intervention in order to improve on the following deficits (Retired) Decreased balance;Impaired UE functional use;Impaired tone;Decreased range of motion;Decreased coordination;Decreased knowledge of use of DME;Decreased cognition;Decreased mobility   Rehab Potential Good   Clinical Impairments Affecting Rehab Potential memory/cognitive deficits   OT Frequency 2x / week   OT Duration 8 weeks   OT Treatment/Interventions Self-care/ADL training;Cryotherapy;Ultrasound;DME and/or AE instruction;Manual Therapy;Therapeutic activities;Passive range of motion;Patient/family  education;Therapeutic exercises;Functional Mobility Training;Moist Heat;Neuromuscular education;Cognitive remediation/compensation;Therapeutic exercise   Plan Pt placed on hold until after wife's surgery   OT Home Exercise Plan issued PWR! supine, seated 05/25/15, 05/31/15:  PWR! hands (basic 4), coordination  HEP issued, 06/07/15:  big movements with ADLs   Consulted and Agree with Plan of Care Patient;Family member/caregiver   Family Member Consulted wife        Problem List Patient Active Problem List   Diagnosis Date Noted  . Parkinsonism 07/09/2014  . Mild cognitive impairment 07/09/2014  . Encounter for therapeutic drug monitoring 12/17/2013  . 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 Hypertension   . Hyperlipidemia   . CKD (chronic kidney disease) stage 2, GFR 60-89 ml/min   . OSA (obstructive sleep apnea)   . Hepatic steatosis   . Chronic anticoagulation   . Atrial fibrillation 08/28/2013  . AAA (abdominal aortic aneurysm) without rupture 04/14/2013  . Aftercare following surgery of the circulatory system, [place] 04/14/2013  . Coronary artery disease 07/27/2009    RINE,KATHRYN 06/10/2015, 2:47 PM Theone Murdoch, OTR/L [phone] Phone: [phone] 2:47 PM 06/10/2015 [facility] [address] [place], [place], [address] Phone: [phone]   Fax:  [phone]

## 2015-06-10 NOTE — Patient Instructions (Signed)
  Please complete the assigned speech therapy homework and return it to your next session.  

## 2015-06-10 NOTE — Therapy (Signed)
[facility] [address] [place], [place], [address] Phone: [phone]   Fax:  [phone]  Speech Language Pathology Treatment  Patient Details  Name: Troy [name] MRN: [id number] Date of Birth: 1941-11-03 Referring Provider: Star Age  Encounter Date: 06/10/2015      End of Session - 06/10/15 1400    Visit Number 9   Number of Visits 16   Date for SLP Re-Evaluation 07/09/15   SLP Start Time 20   SLP Stop Time  1400   SLP Time Calculation (min) 42 min   Activity Tolerance Patient tolerated treatment well      Past Medical History  Diagnosis Date  . Constipation   . Diabetes mellitus   . Facial nerve spasm     L eye, tx'd with botox  . Fecal impaction   . Hypertension   . Hypothyroidism   . Hyperlipidemia   . Diverticulosis   . Gout   . CKD (chronic kidney disease) stage 2, GFR 60-89 ml/min   . OSA (obstructive sleep apnea)     intolerant to CPAP followed by Dr. Lamonte Sakai  . AAA (abdominal aortic aneurysm)     with mural thrombus  . Hepatic steatosis   . Coronary artery disease 07/2009    severe 3 vessel ASCAD s/p CABG with LIMA to LAD, SVG to diag, SVG to PL  . PAF (paroxysmal atrial fibrillation)   . Chronic anticoagulation     Past Surgical History  Procedure Laterality Date  . Coronary artery bypass graft  07-2009  . Spine surgery  1998  . Eye surgery Left May  2013    Cataract  . Eye surgery Right June 2013    Cataract  . Abdominal aortic aneurysm repair  01-2010    aorto-bi-iliac Gore Excluder stent graft    There were no vitals filed for this visit.  Visit Diagnosis: Aphasia  Dysarthria      Subjective Assessment - 06/10/15 1325    Subjective "Haven't practiced a lot with being loud." Pt is not concerned with louder speech, but more with anomia.               ADULT SLP TREATMENT - 06/10/15 1323    General Information   Behavior/Cognition Alert;Cooperative;Pleasant  mood   Treatment Provided   Treatment provided Cognitive-Linquistic   Pain Assessment   Pain Assessment No/denies pain   Cognitive-Linquistic Treatment   Treatment focused on Aphasia;Dysarthria   Skilled Treatment SLP facilitated short loud /a/ (due to pt hyperfunction) with average 84dB.  Pt, in tasks facilitated by SLP for anomia, sequencing and describing cards in a sequence. Pt req'd min A rarely, and by end of task pt req'd mod A rarely.    Assessment / Recommendations / Plan   Plan Continue with current plan of care   Progression Toward Goals   Progression toward goals Progressing toward goals            SLP Short Term Goals - 06/10/15 1403    SLP SHORT TERM GOAL #1   Title pt will demo 18/20 sentence responses with loudness >69dB   Status Achieved   SLP SHORT TERM GOAL #2   Title pt will demo 5 minutes simple conversation at or above 70dB with rare min verbal cues   Status Achieved   SLP SHORT TERM GOAL #3   Title pt will tell SLP 4 memory strategies   Time 1   Period Weeks   Status Not  Met   SLP SHORT TERM GOAL #4   Title pt will complete simple naming activities with 90% success and rare min A   Time 1   Period Weeks   Status Not Met          SLP Long Term Goals - 07/10/2015 1403    SLP LONG TERM GOAL #1   Title pt will participate in 8 minutes mod complex conversation with rare nonverbal cues for loudness   Time 5   Period Weeks   Status Not Met   SLP LONG TERM GOAL #2   Title when nonverbal cues for incr'e loudness are used by the listener, pt will increase loudness to average 69dB for remainder of message in 90% of opportunities   Time 5   Period Weeks   Status Not Met   SLP LONG TERM GOAL #3   Title pt will demo incr'd utilization of memory strategies per pt/family report   Time 5   Period Weeks   Status Not Met   SLP LONG TERM GOAL #4   Title In mod complex conversation of 10 minutes pt will demo functional word finding skills   Time 5   Period  Weeks   Status Not Met          Plan - Jul 10, 2015 1401    Clinical Impression Statement  Pt and spouse aware of activities to work on word finding at home as next session is pt's last session due to wife having knee surgery. I encouraged pt to d/w neurologist progressive aphasia and memory issues.    Treatment/Interventions SLP instruction and feedback;Compensatory strategies;Internal/external aids;Patient/family education;Functional tasks;Cueing hierarchy   Potential to Achieve Goals Good   Potential Considerations Ability to learn/carryover information          G-Codes - 2015/07/10 1404    Functional Assessment Tool Used noms   Functional Limitations Motor speech   Motor Speech Goal Status [phone]) At least 20 percent but less than 40 percent impaired, limited or restricted   Motor Speech Goal Status (E4235) At least 20 percent but less than 40 percent impaired, limited or restricted     [place] SUMMARY  Visits from Start of Care: 9  Current functional level related to goals / functional outcomes: Pt with loudness approximately WNL when not experiencing anomic episodes. This appeared to improve over the first three weeks of therapy.  See above for progress for "SLP short term goals" and "SLP long term goals." Many goals were not met due to cutting therapy course short for reason of wife's knee surgery.   Remaining deficits: Anomia, due to Parkinson's Disease. Mild intermittent dysarthria.   Education / Equipment: Activities to perform for language remediation at home, loud /a/ (shortened due to hyperfunctional voice).   Plan: Patient agrees to discharge.  Patient goals were partially met. Patient is being discharged due to the patient's request.  ?????       Problem List Patient Active Problem List   Diagnosis Date Noted  . Parkinsonism 07/09/2014  . Mild cognitive impairment 07/09/2014  . Encounter for therapeutic drug monitoring 12/17/2013  . [place]  post (EVAR) endovascular aneurysm repair 11/10/2013  . Hypertension   . Hyperlipidemia   . CKD (chronic kidney disease) stage 2, GFR 60-89 ml/min   . OSA (obstructive sleep apnea)   . Hepatic steatosis   . Chronic anticoagulation   . Atrial fibrillation 08/28/2013  . AAA (abdominal aortic aneurysm) without rupture 04/14/2013  . Aftercare following surgery  of the circulatory system, [place] 04/14/2013  . Coronary artery disease 07/27/2009    [facility] , MS, CCC-SLP   06/10/2015, 2:05 PM  [facility] [address] [place], [place], [address] Phone: [phone]   Fax:  [phone]

## 2015-06-16 ENCOUNTER — Ambulatory Visit (INDEPENDENT_AMBULATORY_CARE_PROVIDER_SITE_OTHER): Payer: Medicare Other

## 2015-06-16 DIAGNOSIS — I4891 Unspecified atrial fibrillation: Secondary | ICD-10-CM

## 2015-06-16 DIAGNOSIS — Z5181 Encounter for therapeutic drug level monitoring: Secondary | ICD-10-CM

## 2015-06-16 LAB — POCT INR: INR: 2.2

## 2015-07-07 ENCOUNTER — Ambulatory Visit (INDEPENDENT_AMBULATORY_CARE_PROVIDER_SITE_OTHER): Payer: Medicare Other

## 2015-07-07 DIAGNOSIS — Z5181 Encounter for therapeutic drug level monitoring: Secondary | ICD-10-CM

## 2015-07-07 DIAGNOSIS — I4891 Unspecified atrial fibrillation: Secondary | ICD-10-CM

## 2015-07-07 LAB — POCT INR: INR: 3

## 2015-07-24 ENCOUNTER — Other Ambulatory Visit: Payer: Self-pay | Admitting: Neurology

## 2015-07-25 ENCOUNTER — Other Ambulatory Visit: Payer: Self-pay | Admitting: Neurology

## 2015-07-26 ENCOUNTER — Ambulatory Visit (INDEPENDENT_AMBULATORY_CARE_PROVIDER_SITE_OTHER): Payer: Medicare Other | Admitting: Pharmacist

## 2015-07-26 DIAGNOSIS — I4891 Unspecified atrial fibrillation: Secondary | ICD-10-CM | POA: Diagnosis not present

## 2015-07-26 DIAGNOSIS — Z5181 Encounter for therapeutic drug level monitoring: Secondary | ICD-10-CM | POA: Diagnosis not present

## 2015-07-26 LAB — POCT INR: INR: 2.6

## 2015-07-31 ENCOUNTER — Other Ambulatory Visit: Payer: Self-pay | Admitting: Cardiology

## 2015-08-18 DIAGNOSIS — Z961 Presence of intraocular lens: Secondary | ICD-10-CM | POA: Diagnosis not present

## 2015-08-18 DIAGNOSIS — H26492 Other secondary cataract, left eye: Secondary | ICD-10-CM | POA: Diagnosis not present

## 2015-08-18 DIAGNOSIS — H3531 Nonexudative age-related macular degeneration: Secondary | ICD-10-CM | POA: Diagnosis not present

## 2015-08-18 DIAGNOSIS — H40013 Open angle with borderline findings, low risk, bilateral: Secondary | ICD-10-CM | POA: Diagnosis not present

## 2015-08-19 ENCOUNTER — Ambulatory Visit (INDEPENDENT_AMBULATORY_CARE_PROVIDER_SITE_OTHER): Payer: Medicare Other | Admitting: *Deleted

## 2015-08-19 DIAGNOSIS — I4891 Unspecified atrial fibrillation: Secondary | ICD-10-CM

## 2015-08-19 DIAGNOSIS — Z5181 Encounter for therapeutic drug level monitoring: Secondary | ICD-10-CM | POA: Diagnosis not present

## 2015-08-19 LAB — POCT INR: INR: 2.5

## 2015-08-21 ENCOUNTER — Other Ambulatory Visit: Payer: Self-pay | Admitting: Neurology

## 2015-08-23 ENCOUNTER — Encounter: Payer: Self-pay | Admitting: Neurology

## 2015-08-23 ENCOUNTER — Ambulatory Visit (INDEPENDENT_AMBULATORY_CARE_PROVIDER_SITE_OTHER): Payer: Medicare Other | Admitting: Neurology

## 2015-08-23 VITALS — BP 128/72 | HR 70 | Resp 16 | Ht 67.0 in | Wt 195.0 lb

## 2015-08-23 DIAGNOSIS — G5139 Clonic hemifacial spasm, unspecified: Secondary | ICD-10-CM

## 2015-08-23 DIAGNOSIS — G518 Other disorders of facial nerve: Secondary | ICD-10-CM | POA: Diagnosis not present

## 2015-08-23 DIAGNOSIS — M7989 Other specified soft tissue disorders: Secondary | ICD-10-CM

## 2015-08-23 DIAGNOSIS — G3184 Mild cognitive impairment, so stated: Secondary | ICD-10-CM

## 2015-08-23 DIAGNOSIS — G4752 REM sleep behavior disorder: Secondary | ICD-10-CM

## 2015-08-23 DIAGNOSIS — I2510 Atherosclerotic heart disease of native coronary artery without angina pectoris: Secondary | ICD-10-CM | POA: Diagnosis not present

## 2015-08-23 DIAGNOSIS — G20 Parkinson's disease: Secondary | ICD-10-CM

## 2015-08-23 MED ORDER — DONEPEZIL HCL 10 MG PO TABS: 10.0000 mg | ORAL_TABLET | Freq: Every day | ORAL | Status: DC

## 2015-08-23 MED ORDER — CARBIDOPA-LEVODOPA ER 50-200 MG PO TBCR: 1.0000 | EXTENDED_RELEASE_TABLET | Freq: Three times a day (TID) | ORAL | Status: DC

## 2015-08-23 MED ORDER — CLONAZEPAM 0.5 MG PO TABS: 0.5000 mg | ORAL_TABLET | Freq: Every day | ORAL | Status: DC

## 2015-08-23 MED ORDER — ENTACAPONE 200 MG PO TABS: 200.0000 mg | ORAL_TABLET | Freq: Three times a day (TID) | ORAL | Status: DC

## 2015-08-23 NOTE — Patient Instructions (Signed)
We will continue with your medications, monitor you leg swelling and your memory.  Please start elevating your legs and use compression stockings to your legs.  Follow up in 4 month.

## 2015-08-23 NOTE — Progress Notes (Signed)
Subjective:    Patient ID: Troy [name] is a 74 y.o. male.  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 Maudry Mayhew), who presents for follow-up consultation of his parkinsonism. The patient is accompanied by his wife again today. I last saw him on 03/07/2015, at which time he asked whether he could drive. His wife was supposed to have knee replacement surgery in July 2016 and he needed a way to get to his therapy appointments. He had benefit from outpatient occupational, physical and speech therapy in the past. He was working with a Physiological scientist 2 times a week as well. He had fallen in the past repeatedly. He felt his memory was about the same. He had some more daytime somnolence. He felt that this happened especially after taking Sinemet. He was taking Sinemet long-acting 3 times a day, 50-200 milligrams strength. He was taking Comtan 3 times a day. He had not changed his medication regimen well. He had no recent falls. His left hemifacial spasm had remained the same. He was sleeping a little better after starting clonazepam. I suggested we switch him to immediate release Sinemet, 25-100 milligrams strength, 1 pill 4 times a day and I suggested we continue Comtan 200 mg 3 times a day. I suggested he start using lower extremity compression stockings to help with the pressure maintenance and lower extremity swelling reduction. I asked him no longer to drive.  Today, 08/23/2015: He reports being in outpatient PT, OT and ST  and his speech has deteriorated. She feels, that his memory is worse. He did not do well with C/L IR qid, and wife called on 03/31/15 to change back to CR, and is back on CR 50/200 mg 1 tid. He goes M/W to ACT and has done LSVT-BIG at neuro rehab. He has been on Aricept for about 3 years. He sleeps well. Unfortunately, after doing fairly well after her left knee replacement surgery, his wife had some setbacks and needed a second surgery to her left leg, then had poor healing and wound infection, needing a wound VAC and now is walking with crutches but cannot bend her left leg. She cannot drive either at this time.  Previously:    I first met him on 08/23/2014 at the request of Dr. Erlinda Hong, at which time the patient reported 5 year history of parkinsonism. I kept him on the current medication regimen. 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   He was diagnosed with parkinsonism about 5 years ago. He was placed on Sinemet and continues to take Sinemet ER 50-200 mg strength 3 times a day. Comtan was added recently about 4 months ago. 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 Maudry Mayhew for hemifacial spasm. Previous workup included MRI brain last year showing brain atrophy and small vessel white matter changes. 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 He also had LP done in January 2015 which did not show evidence of NPH,  and the CSF was clean.   He noted more difficulty with his gait after stopping the comtan, so he re-started it. His sleep is better. He has had some memory loss. He has been on Aricept for about 2 years and memory has been stable. He has no VH, or AH. He has no mood issues.    His Past Medical History Is Significant For: Past Medical History  Diagnosis Date  . Constipation   .  Diabetes mellitus   . Facial nerve spasm     L eye, tx'd with botox  . Fecal impaction   . Hypertension   . Hypothyroidism   . Hyperlipidemia   . Diverticulosis   . Gout   . CKD (chronic kidney disease) stage 2, GFR 60-89 ml/min   . OSA (obstructive sleep apnea)     intolerant to CPAP followed by Dr. Lamonte Sakai  . AAA (abdominal aortic aneurysm)     with mural thrombus  . Hepatic steatosis   . Coronary artery disease 07/2009    severe 3 vessel ASCAD s/p CABG with LIMA to LAD, SVG to diag, SVG to PL  . PAF (paroxysmal atrial fibrillation)   . Chronic anticoagulation     His Past Surgical History Is Significant For: Past Surgical History  Procedure Laterality Date  . Coronary artery bypass graft  07-2009  . Spine surgery  1998  . Eye surgery Left May  2013    Cataract  . Eye surgery Right June 2013    Cataract  . Abdominal aortic aneurysm repair  01-2010    aorto-bi-iliac Gore Excluder stent graft    His Family History Is Significant For: Family History  Problem Relation Age of Onset  . Heart disease Mother     CHF  . Alzheimer's disease Mother   . Alzheimer's disease Father   . Alzheimer's disease Brother   . Alzheimer's disease Paternal Uncle     His Social History Is Significant For: Social History   Social History  . Marital Status: Married    Spouse Name: N/A  . Number of Children: 3  . Years of Education: BS   Occupational History  . retired    Social History Main Topics  . Smoking status: Former Smoker -- 15 years    Types: Cigarettes    Quit date: 10/07/2008  . Smokeless tobacco: Never Used  . Alcohol Use: 1.2 - 1.8 oz/week    2-3 Glasses of wine per week     Comment: OCC.  . Drug Use: No  . Sexual Activity: No   Other Topics Concern  . None   Social History Narrative   Patient lives at home with his wife   Patient is right handed   Patient drinks coffee    His Allergies Are:  Allergies  Allergen Reactions  . Penicillins Rash  . Tetanus  Toxoids Rash  :   His Current Medications Are:  Outpatient Encounter Prescriptions as of 08/23/2015  Medication Sig  . amLODipine (NORVASC) 10 MG tablet TAKE ONE TABLET BY MOUTH ONCE DAILY  . BYSTOLIC 5 MG tablet TAKE ONE TABLET BY MOUTH ONCE DAILY  . carbidopa-levodopa (SINEMET CR) 50-200 MG per tablet TAKE ONE TABLET BY MOUTH THREE TIMES DAILY  . carboxymethylcellulose (REFRESH PLUS) 0.5 % SOLN Place 1 drop into both eyes 3 (three) times daily as needed (dry eyes).  . Cholecalciferol (VITAMIN D-3) 1000 UNITS CAPS Take 1,000 Units by mouth daily.  . Cinnamon 500 MG capsule Take 500 mg  by mouth daily.   . clindamycin (CLEOCIN) 150 MG capsule Take 150 mg by mouth as needed.  . clonazePAM (KLONOPIN) 0.5 MG tablet TAKE ONE TABLET BY MOUTH ONCE DAILY AT BEDTIME  . CRESTOR 5 MG tablet TAKE ONE TABLET BY MOUTH ONCE DAILY  . DHA-EPA-Vitamin E 696-295-2 MG-MG-UNIT CAPS Take 1 capsule by mouth 2 (two) times daily as needed (dry eyes).   Marland Kitchen donepezil (ARICEPT) 10 MG tablet Take 1 tablet (10 mg total) by mouth at bedtime.  . entacapone (COMTAN) 200 MG tablet TAKE ONE TABLET BY MOUTH THREE TIMES DAILY  . EPIPEN 2-PAK 0.3 MG/0.3ML SOAJ Inject 0.3 mg into the muscle once as needed. Allergic reaction  . fluticasone (FLONASE) 50 MCG/ACT nasal spray Place 2 sprays into the nose daily as needed for allergies.   Marland Kitchen levothyroxine (SYNTHROID, LEVOTHROID) 125 MCG tablet Take 125 mcg by mouth daily.    Marland Kitchen lisinopril (PRINIVIL,ZESTRIL) 40 MG tablet TAKE ONE TABLET BY MOUTH ONCE DAILY  . OVER THE COUNTER MEDICATION Take 1 tablet by mouth 2 (two) times daily. Macular degeneration supplement  . OVER THE COUNTER MEDICATION Take 1 capsule by mouth daily. Homocysteine supplement  . rosuvastatin (CRESTOR) 5 MG tablet Take 1 tablet (5 mg total) by mouth daily.  Marland Kitchen warfarin (COUMADIN) 5 MG tablet TAKE 1 TABLET BY MOUTH AS DIRECTED  . [DISCONTINUED] clobetasol (TEMOVATE) 0.05 % external solution Apply 1 application topically 2  (two) times daily as needed (itching).   . [DISCONTINUED] GRAPE SEED EXTRACT PO Take 100 mg by mouth daily. OTC  . [DISCONTINUED] ketoconazole (NIZORAL) 2 % shampoo Apply 1 application topically 2 (two) times daily as needed for irritation.    No facility-administered encounter medications on file as of 08/23/2015.  :  Review of Systems:  Out of a complete 14 point review of systems, all are reviewed and negative with the exception of these symptoms as listed below:   Review of Systems  Neurological:       Wife reports that patient is currently in ST, OT, and PT. She states that the ST wanted her to mention that patient's language is deteriorating.   Wife would like to discuss patient's memory loss and memory medication.   Needs refill of carb/levo.     Objective:  Neurologic Exam  Physical Exam Physical Examination:   Filed Vitals:   08/23/15 1432  BP: 128/72  Pulse: 70  Resp: 16    General Examination: The patient is a very pleasant 74 y.o. male in no acute distress.  HEENT: Normocephalic, atraumatic, pupils are equal, round and reactive to light and accommodation. Funduscopic exam is normal with sharp disc margins noted. Extraocular tracking shows very mild saccadic breakdown without nystagmus noted. He is s/p cataract repairs. There is limitation to upper gaze. There is a very mild decrease in eye blink rate. L eyelid is droopy, slightly better, s/p eyelid surgery. Hearing is impaired mildly. 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 There is no lip, neck or jaw tremor. Neck is minimally rigid with intact passive ROM. There are no carotid bruits on auscultation. Oropharynx exam reveals moderate mouth dryness. No significant airway crowding is noted. Mallampati is class II. Tongue protrudes centrally and palate elevates symmetrically. There is mild drooling.   Chest: is clear  to auscultation without wheezing, rhonchi or crackles noted.  Heart: sounds are regular and normal without murmurs, rubs or gallops noted.  Abdomen: is soft, non-tender and non-distended with normal bowel sounds appreciated on auscultation.  Extremities: There is 2+ pitting edema in the distal lower extremities bilaterally, right worse than left. Pedal pulses are intact. Chronic stasis-like changes are noted in the distal legs bilaterally. There are no varicose veins.  Skin: is warm and dry with no trophic changes noted. Age-related changes are noted on the skin.   Musculoskeletal: exam reveals no obvious joint deformities, tenderness, joint swelling or erythema.  Neurologically:  Mental status: The patient is awake and alert, paying good  attention. He is able to partially provide the history. His wife provides most of the history. He is oriented to: person, place, situation, day of week, month of year and year. His memory, attention, language and knowledge are impaired mildly. There is no aphasia, agnosia, apraxia or anomia. There is a mild degree of bradyphrenia. Speech is mildly hypophonic with mild dysarthria noted. Mood is congruent and affect is normal.  Cranial nerves are as described above under HEENT exam. In addition, shoulder shrug is normal with equal shoulder height noted.  Motor exam: Normal bulk, and strength for age is noted. There are no dyskinesias noted. Tone is mildly rigid with absence of cogwheeling in the extremities. There is overall mild bradykinesia. There is no drift or rebound. There is no tremor.   Romberg is negative.  Reflexes are 1+ in the upper extremities and 1+ in the lower extremities. Toes are downgoing bilaterally.  Fine motor skills exam: Finger taps are mildly impaired on the right and mildly impaired on the left. Hand movements are mildly impaired on the right and mildly impaired on the left. RAP (rapid alternating patting) is mildly impaired on the right  and mildly impaired on the left. Foot taps are mildly impaired on the right and mildly impaired on the left. Foot agility (in the form of heel stomping) is moderately impaired on the right and moderately impaired on the left.    Cerebellar testing shows no dysmetria or intention tremor on finger to nose testing. Heel to shin is unremarkable bilaterally. There is no truncal or gait ataxia.   Sensory exam is intact to light touch in the upper and lower extremities.   Gait, station and balance: He stands up from the seated position with mild difficulty and does need to push up with His hands. He needs no assistance. No veering to one side is noted. He is not noted to lean to the side. Posture is moderately stooped, perhaps slightly worse. Mild start hesitation. No freezing is noted. Stance is narrow-based. He walks with decrease in stride length and pace and decreased arm swing on both sides. He has trouble turning. Balance is mildly impaired.   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 Maudry Mayhew, who was diagnosed with parkinsonism about 6 years ago. He has been on Comtan and long-acting Sinemet, couldn't tolerate the IR. For memory loss he has been on Aricept. We will monitor and check MMSE next time and consider a second medication if needed for memory. He has overall mild to moderate parkinsonism but no significant lateralization  and mild progression, speech is worse, but may be confounded by his L HFS. He has had some  response to Sinemet. He reports sleepiness from the medication. I would like to suggest he continue with Sinemet CR and Comtan, both tid. For now, we will keep the Aricept that same and monitor his symptoms. He is encouraged to drink more water. He is encouraged to start using compression stockings to his lower extremities.  He will continue clonazepam. I provided him with refills on his Sinemet CR, Aricept generic, Comtan, and clonazepam today. I would like to see him back in 4 months. His swelling has become worse, it has always been a little worse on the right side. He is advised to monitor, use compression stockings, elevate his legs and see his primary care physician if needed if these measures do not help. His blood pressure looks better today. He no longer drives. Unfortunately, his wife has had some health setbacks. She is trying to walk with a crutch. Unfortunately, she cannot drive either. I answered all their questions today and the patient and his wife were in agreement with the above outlined plan. I spent 25 minutes in total face-to-face time with the patient, more than 50% of which was spent in counseling and coordination of care, reviewing test results, reviewing medication and discussing or reviewing the diagnosis of Parkinsonism, memory loss, the prognosis and treatment options.

## 2015-08-25 ENCOUNTER — Ambulatory Visit: Payer: Medicare Other | Admitting: Neurology

## 2015-08-25 DIAGNOSIS — C44311 Basal cell carcinoma of skin of nose: Secondary | ICD-10-CM | POA: Diagnosis not present

## 2015-08-29 DIAGNOSIS — Z23 Encounter for immunization: Secondary | ICD-10-CM | POA: Diagnosis not present

## 2015-09-26 DIAGNOSIS — G518 Other disorders of facial nerve: Secondary | ICD-10-CM | POA: Diagnosis not present

## 2015-09-26 DIAGNOSIS — G513 Clonic hemifacial spasm: Secondary | ICD-10-CM | POA: Diagnosis not present

## 2015-09-30 ENCOUNTER — Ambulatory Visit (INDEPENDENT_AMBULATORY_CARE_PROVIDER_SITE_OTHER): Payer: Medicare Other | Admitting: *Deleted

## 2015-09-30 DIAGNOSIS — I4891 Unspecified atrial fibrillation: Secondary | ICD-10-CM | POA: Diagnosis not present

## 2015-09-30 DIAGNOSIS — Z5181 Encounter for therapeutic drug level monitoring: Secondary | ICD-10-CM | POA: Diagnosis not present

## 2015-09-30 LAB — POCT INR: INR: 3.4

## 2015-11-01 ENCOUNTER — Telehealth: Payer: Self-pay | Admitting: Neurology

## 2015-11-01 NOTE — Telephone Encounter (Signed)
I spoke to [place] and   she says that the pills are missing but he doesn't remember taking them and they cant find them. She says if he did take it, it would have been at 4-5am this am. She has not noticed any changes in him and his BP is 136/88. She wonders if she can just monitor him throughout the day?  Per Dr. Rexene Alberts it is hard for her to make that call without further assessment. Ok to monitor but if any changes they must call 911 or go to ED.

## 2015-11-01 NOTE — Telephone Encounter (Signed)
If he truly took all his 4 doses of Sinemet and entacapone at once, please have her take him to the emergency room. Altogether, this is not a high dose, as far as total dose is concerned but it is very high for a once daily dose. Side effects can include very low blood pressure, nausea, vomiting, confusion, hallucination, and therefore I recommend that he be checked out.

## 2015-11-01 NOTE — Telephone Encounter (Signed)
I spoke to [place] and she is aware of advice below. She will keep an eye on him and take his BP at least every hour. She voiced that she will call 911 or go to ED if there are any changes.

## 2015-11-01 NOTE — Telephone Encounter (Signed)
Patient's wife is calling and states she believes her husband has taken all his medication at 4:00 am this morning.  He normally takes his meds at 4 different times during the day. She is referring to Carbidopa-levodopa 50-200 mg tablets and Entacapone 200 mg. She is asking if there is danger in him over-dosing since he has done that. Also, should he take additional dosages during the day as she believes the medications on stay in his body for a certain length of time. Please call.

## 2015-11-01 NOTE — Telephone Encounter (Signed)
Troy [name] is aware that Troy [name] recommended to skip the 12pm dose and only take his 6pm dose.

## 2015-11-06 ENCOUNTER — Other Ambulatory Visit: Payer: Self-pay | Admitting: Cardiology

## 2015-11-10 ENCOUNTER — Ambulatory Visit (INDEPENDENT_AMBULATORY_CARE_PROVIDER_SITE_OTHER): Payer: Medicare Other | Admitting: *Deleted

## 2015-11-10 DIAGNOSIS — I4891 Unspecified atrial fibrillation: Secondary | ICD-10-CM

## 2015-11-10 DIAGNOSIS — Z5181 Encounter for therapeutic drug level monitoring: Secondary | ICD-10-CM | POA: Diagnosis not present

## 2015-11-10 LAB — POCT INR: INR: 2.7

## 2015-11-18 ENCOUNTER — Other Ambulatory Visit: Payer: Self-pay | Admitting: Vascular Surgery

## 2015-11-18 ENCOUNTER — Other Ambulatory Visit: Payer: Self-pay | Admitting: *Deleted

## 2015-11-18 DIAGNOSIS — Z01812 Encounter for preprocedural laboratory examination: Secondary | ICD-10-CM

## 2015-11-18 DIAGNOSIS — I714 Abdominal aortic aneurysm, without rupture: Secondary | ICD-10-CM | POA: Diagnosis not present

## 2015-11-18 LAB — POC BUN/CREATININE
BUN, ISTAT: 16 mg/dL (ref 8–26)
CREATININE, ISTAT: 0.9 mg/dL (ref 0.6–1.3)

## 2015-11-22 ENCOUNTER — Encounter: Payer: Self-pay | Admitting: Vascular Surgery

## 2015-11-22 ENCOUNTER — Ambulatory Visit
Admission: RE | Admit: 2015-11-22 | Discharge: 2015-11-22 | Disposition: A | Discharge status: Home / Self Care | Payer: Medicare Other | Source: Ambulatory Visit | Attending: Vascular Surgery | Admitting: Vascular Surgery

## 2015-11-22 ENCOUNTER — Ambulatory Visit: Payer: Medicare Other | Admitting: Vascular Surgery

## 2015-11-22 DIAGNOSIS — Z48812 Encounter for surgical aftercare following surgery on the circulatory system: Secondary | ICD-10-CM

## 2015-11-22 DIAGNOSIS — I714 Abdominal aortic aneurysm, without rupture, unspecified: Secondary | ICD-10-CM

## 2015-11-22 MED ORDER — IOPAMIDOL (ISOVUE-370) INJECTION 76%
80.0000 mL | Freq: Once | INTRAVENOUS | Status: AC | PRN
  Administered 2015-11-22: 80 mL via INTRAVENOUS

## 2015-11-24 ENCOUNTER — Encounter: Payer: Self-pay | Admitting: Physical Therapy

## 2015-11-24 NOTE — Therapy (Signed)
[facility] [address] [place], [place], [address] Phone: [phone]   Fax:  [phone]  Patient Details  Name: Troy [name] MRN: [id number] Date of Birth: 1941/10/31 Referring Provider:  No ref. provider found  Encounter Date: 11/24/2015   PHYSICAL THERAPY DISCHARGE SUMMARY  Visits from Start of Care: 3  Current functional level related to goals / functional outcomes:     PT Long Term Goals - 05/19/15 1428    PT LONG TERM GOAL #1   Title Pt will perform HEP with family supervision, for improved balance and gait.  Target 06/18/15   Time 4   Period Weeks   Status New   PT LONG TERM GOAL #2   Title Pt will improve Dynamic Gait Index score to at least 20/24 for decreased fall risk.   Time 4   Period Weeks   Status New   PT LONG TERM GOAL #3   Title Pt will improve TUG cognitive score to less than or equal to 15 seconds for improved dual tasking with gait.   Time 4   Period Weeks   Status New   PT LONG TERM GOAL #4   Title Pt will improve 4-square step test by at least 5 seconds for improved stepping in variable directions   Time 4   Period Weeks   Status New   PT LONG TERM GOAL #5   Title Pt/family will verbalize understanding of fall prevention within home environment.   Time 4   Period Weeks   Status New      Goals not fully able to be assessed due to pt completing therapy early/not returning due to wife's surgery. Remaining deficits: Pt has been instructed in HEP-limited in ability to continue therapy at this point due to wife's surgery.   Education / Equipment: HEP instruction, fall prevention.  Plan: Patient agrees to discharge.  Patient goals were partially met. Patient is being discharged due to being pleased with the current functional level.  ?????Pt has been given initial instruction, but was not able to return to therapy due to wife's surgery.  Frazier Butt., PT   Mady Haagensen  W. 11/24/2015, 8:27 AM  [facility] [address] [place], [place], [address] Phone: [phone]   Fax:  [phone]

## 2015-11-29 ENCOUNTER — Ambulatory Visit (INDEPENDENT_AMBULATORY_CARE_PROVIDER_SITE_OTHER): Payer: Medicare Other | Admitting: Vascular Surgery

## 2015-11-29 ENCOUNTER — Encounter: Payer: Self-pay | Admitting: Vascular Surgery

## 2015-11-29 ENCOUNTER — Other Ambulatory Visit: Payer: Self-pay | Admitting: Cardiology

## 2015-11-29 VITALS — BP 123/72 | HR 48 | Temp 97.8°F | Resp 16 | Ht 67.75 in | Wt 192.0 lb

## 2015-11-29 DIAGNOSIS — I714 Abdominal aortic aneurysm, without rupture, unspecified: Secondary | ICD-10-CM

## 2015-11-29 NOTE — Progress Notes (Signed)
Subjective:     Patient ID: Troy [name], male   DOB: 04/12/1941, 75 y.o.   MRN: [id number]  HPI this 75 year old male returns for continued follow-up regarding his abdominal aortic aneurysm stent graft which I inserted in 2011. In the past he has had a small endoleak but the aneurysm sac has been quite stable and slightly over 5 cm. He denies any new abdominal or back symptoms. He does not ambulate much. He does have edema in both lower extremities he reports. There are no other new symptoms.  Past Medical History  Diagnosis Date  . Constipation   . Diabetes mellitus   . Facial nerve spasm     L eye, tx'd with botox  . Fecal impaction ([place])   . Hypertension   . Hypothyroidism   . Hyperlipidemia   . Diverticulosis   . Gout   . CKD (chronic kidney disease) stage 2, GFR 60-89 ml/min   . OSA (obstructive sleep apnea)     intolerant to CPAP followed by Dr. Lamonte Sakai  . AAA (abdominal aortic aneurysm) ([place])     with mural thrombus  . Hepatic steatosis   . Coronary artery disease 07/2009    severe 3 vessel ASCAD s/p CABG with LIMA to LAD, SVG to diag, SVG to PL  . PAF (paroxysmal atrial fibrillation) ([place])   . Chronic anticoagulation     Social History  Substance Use Topics  . Smoking status: Former Smoker -- 15 years    Types: Cigarettes    Quit date: 10/07/2008  . Smokeless tobacco: Never Used  . Alcohol Use: 1.2 - 1.8 oz/week    2-3 Glasses of wine per week     Comment: OCC.    Family History  Problem Relation Age of Onset  . Heart disease Mother     CHF  . Alzheimer's disease Mother   . Alzheimer's disease Father   . Alzheimer's disease Brother   . Alzheimer's disease Paternal Uncle     Allergies  Allergen Reactions  . Penicillins Rash  . Tetanus Toxoids Rash     Current outpatient prescriptions:  .  amLODipine (NORVASC) 10 MG tablet, TAKE ONE TABLET BY MOUTH ONCE DAILY, Disp: 30 tablet, Rfl: 1 .  BYSTOLIC 5 MG tablet, TAKE ONE TABLET BY MOUTH ONCE DAILY, Disp:  30 tablet, Rfl: 3 .  carbidopa-levodopa (SINEMET CR) 50-200 MG tablet, Take 1 tablet by mouth 3 (three) times daily., Disp: 270 tablet, Rfl: 3 .  carboxymethylcellulose (REFRESH PLUS) 0.5 % SOLN, Place 1 drop into both eyes 3 (three) times daily as needed (dry eyes)., Disp: , Rfl:  .  Cholecalciferol (VITAMIN D-3) 1000 UNITS CAPS, Take 1,000 Units by mouth daily., Disp: , Rfl:  .  Cinnamon 500 MG capsule, Take 500 mg by mouth daily. , Disp: , Rfl:  .  clindamycin (CLEOCIN) 150 MG capsule, Take 150 mg by mouth as needed., Disp: , Rfl:  .  clonazePAM (KLONOPIN) 0.5 MG tablet, Take 1 tablet (0.5 mg total) by mouth at bedtime., Disp: 90 tablet, Rfl: 3 .  CRESTOR 5 MG tablet, TAKE ONE TABLET BY MOUTH ONCE DAILY, Disp: 30 tablet, Rfl: 11 .  DHA-EPA-Vitamin E [id number] MG-MG-UNIT CAPS, Take 1 capsule by mouth 2 (two) times daily as needed (dry eyes). , Disp: , Rfl:  .  donepezil (ARICEPT) 10 MG tablet, Take 1 tablet (10 mg total) by mouth at bedtime., Disp: 90 tablet, Rfl: 3 .  entacapone (COMTAN) 200 MG tablet, Take 1 tablet (200  mg total) by mouth 3 (three) times daily., Disp: 270 tablet, Rfl: 3 .  EPIPEN 2-PAK 0.3 MG/0.3ML SOAJ, Inject 0.3 mg into the muscle once as needed. Allergic reaction, Disp: , Rfl:  .  fluticasone (FLONASE) 50 MCG/ACT nasal spray, Place 2 sprays into the nose daily as needed for allergies. , Disp: , Rfl:  .  levothyroxine (SYNTHROID, LEVOTHROID) 125 MCG tablet, Take 125 mcg by mouth daily.  , Disp: , Rfl:  .  lisinopril (PRINIVIL,ZESTRIL) 40 MG tablet, TAKE ONE TABLET BY MOUTH ONCE DAILY, Disp: 30 tablet, Rfl: 5 .  OVER THE COUNTER MEDICATION, Take 1 tablet by mouth 2 (two) times daily. Macular degeneration supplement, Disp: , Rfl:  .  OVER THE COUNTER MEDICATION, Take 1 capsule by mouth daily. Homocysteine supplement, Disp: , Rfl:  .  rosuvastatin (CRESTOR) 5 MG tablet, Take 1 tablet (5 mg total) by mouth daily., Disp: 30 tablet, Rfl: 6 .  warfarin (COUMADIN) 5 MG tablet, TAKE  ONE TABLET BY MOUTH AS DIRECTED, Disp: 40 tablet, Rfl: 3  Filed Vitals:   11/29/15 1504  BP: 123/72  Pulse: 48  Temp: 97.8 F (36.6 C)  Resp: 16  Height: 5' 7.75" (1.721 m)  Weight: 192 lb (87.091 kg)  SpO2: 97%    Body mass index is 29.4 kg/(m^2).           Review of Systems Denies chest pain, dyspnea on exertion, PND, orthopnea. Has some gait instability with ambulation. History of degenerative joint dsease of lumbar laminectomy in the past.     Objective:   Physical Exam BP 123/72 mmHg  Pulse 48  Temp(Src) 97.8 F (36.6 C)  Resp 16  Ht 5' 7.75" (1.721 m)  Wt 192 lb (87.091 kg)  BMI 29.40 kg/m2  SpO2 97%   Gen. Well-developed well-nourished male in no apparent distress alert and oriented 3 Lungs no rhonchi or wheezing  abdomen soft nontender with no pulsatile mass palpable 3+ femoral pulses bilaerally with well-perfused lower extremities    today I ordered a CT angiogram of the abdomen and pelvis and reviewed this by computer images and the report. The type II endoleak appears to have closed with no evidence of active endoleak present time. Maximum diameter of the aneurysm is continuing to be in the 5.1-5.4 range. There are no other changes in the vasculature. Patient does have a small umbilical hernia and sludge in the gallbladder which is asymptomatic apparently.     Assessment:      status post  Endovascular stent graft repair of abdominal aortic aneurysm in 2011 with no evidence of active endoleak   aneurysm sac relatively stable at 5.1 x 5.4 cm today     Plan:      returning 1 year with duplex scan of the aneurysm sac in the office and see  nurse practitioner Patient can then return on annual basis with occasional CT angiogram performed

## 2015-12-01 ENCOUNTER — Other Ambulatory Visit: Payer: Self-pay | Admitting: *Deleted

## 2015-12-01 DIAGNOSIS — I714 Abdominal aortic aneurysm, without rupture, unspecified: Secondary | ICD-10-CM

## 2015-12-05 ENCOUNTER — Other Ambulatory Visit: Payer: Self-pay | Admitting: Cardiology

## 2015-12-06 ENCOUNTER — Ambulatory Visit: Payer: Medicare Other | Admitting: Cardiology

## 2015-12-06 ENCOUNTER — Telehealth: Payer: Self-pay | Admitting: Cardiology

## 2015-12-06 DIAGNOSIS — E785 Hyperlipidemia, unspecified: Secondary | ICD-10-CM

## 2015-12-06 NOTE — Telephone Encounter (Signed)
Left message to call back to schedule fasting lab appointment. Labs ordered for scheduling.

## 2015-12-06 NOTE — Telephone Encounter (Signed)
Labs scheduled 1/12.

## 2015-12-06 NOTE — Telephone Encounter (Signed)
New Message  Will this pt need a lab for upcoming appt//shanti

## 2015-12-06 NOTE — Telephone Encounter (Signed)
FLP and ALT 

## 2015-12-08 ENCOUNTER — Other Ambulatory Visit (INDEPENDENT_AMBULATORY_CARE_PROVIDER_SITE_OTHER): Payer: Medicare Other | Admitting: *Deleted

## 2015-12-08 DIAGNOSIS — E785 Hyperlipidemia, unspecified: Secondary | ICD-10-CM | POA: Diagnosis not present

## 2015-12-08 LAB — LIPID PANEL
CHOLESTEROL: 114 mg/dL — AB (ref 125–200)
HDL: 36 mg/dL — ABNORMAL LOW (ref >=40)
LDL Cholesterol: 45 mg/dL (ref <130)
Total CHOL/HDL Ratio: 3.2 Ratio (ref <=5.0)
Triglycerides: 167 mg/dL — ABNORMAL HIGH (ref <150)
VLDL: 33 mg/dL — ABNORMAL HIGH (ref <30)

## 2015-12-08 LAB — ALT: ALT: 14 U/L (ref 9–46)

## 2015-12-08 NOTE — Addendum Note (Signed)
Addended by: Eulis Foster on: 12/08/2015 08:17 AM   Modules accepted: Orders

## 2015-12-09 ENCOUNTER — Telehealth: Payer: Self-pay | Admitting: *Deleted

## 2015-12-09 NOTE — Telephone Encounter (Signed)
Pt and wife both notified of lab results by phone with verbal understanding.  

## 2015-12-13 ENCOUNTER — Ambulatory Visit (INDEPENDENT_AMBULATORY_CARE_PROVIDER_SITE_OTHER): Payer: Medicare Other | Admitting: Pharmacist

## 2015-12-13 DIAGNOSIS — Z5181 Encounter for therapeutic drug level monitoring: Secondary | ICD-10-CM

## 2015-12-13 DIAGNOSIS — I4891 Unspecified atrial fibrillation: Secondary | ICD-10-CM

## 2015-12-13 LAB — POCT INR: INR: 2.7

## 2015-12-15 DIAGNOSIS — L570 Actinic keratosis: Secondary | ICD-10-CM | POA: Diagnosis not present

## 2015-12-15 DIAGNOSIS — L821 Other seborrheic keratosis: Secondary | ICD-10-CM | POA: Diagnosis not present

## 2015-12-15 DIAGNOSIS — D485 Neoplasm of uncertain behavior of skin: Secondary | ICD-10-CM | POA: Diagnosis not present

## 2015-12-15 DIAGNOSIS — L820 Inflamed seborrheic keratosis: Secondary | ICD-10-CM | POA: Diagnosis not present

## 2015-12-15 DIAGNOSIS — Z85828 Personal history of other malignant neoplasm of skin: Secondary | ICD-10-CM | POA: Diagnosis not present

## 2015-12-22 ENCOUNTER — Ambulatory Visit (INDEPENDENT_AMBULATORY_CARE_PROVIDER_SITE_OTHER): Payer: Medicare Other | Admitting: Cardiology

## 2015-12-22 ENCOUNTER — Encounter: Payer: Self-pay | Admitting: Cardiology

## 2015-12-22 VITALS — BP 116/72 | HR 54 | Ht 68.0 in | Wt 194.8 lb

## 2015-12-22 DIAGNOSIS — I4891 Unspecified atrial fibrillation: Secondary | ICD-10-CM

## 2015-12-22 NOTE — Patient Instructions (Signed)

## 2015-12-22 NOTE — Progress Notes (Signed)
12/22/2015 Troy [name]   [birth year]  [id number]  Primary Physician Lilian Coma, MD Primary Cardiologist: Dr. Radford Pax   Reason for Visit/CC: Routine F/u for CAD, PAF, HTN and HLD  HPI:  75 y.o. Male, followed by Dr. Radford Pax,  with a history of ASCAD s/p CABG, AAA s/p endovascular stent graft repair in 2011 followed by Dr. Kellie Simmering,  HTN, dyslipidemia, Parkinson's Disease, OSA now using oral device, dyslipidemia and atrial fibrillation on chronic Warfarin, who presents today for 6 month followup. He was last seen by Dr. Radford Pax 04/2015 and was felt to be stable from a cardiac standpoint. Dr. Radford Pax gave him clearance to discontinue ASA as he was having a lot of bruising and was also on Warfarin. His INRs are followed in our clinic.   He reports that he has done well since his last OV. He denies CP, dyspnea, orthopnea, PND, LEE, palpitations, syncope/ near syncope. EKG shows sinus bradycardia with HR of 54 bpm. BP is well controlled at 116/72. He reports full medication compliance. No abnormal bleeding of falls.     Current Outpatient Prescriptions  Medication Sig Dispense Refill  . amLODipine (NORVASC) 10 MG tablet TAKE ONE TABLET BY MOUTH ONCE DAILY 30 tablet 1  . BYSTOLIC 5 MG tablet TAKE ONE TABLET BY MOUTH ONCE DAILY 30 tablet 3  . carbidopa-levodopa (SINEMET CR) 50-200 MG tablet Take 1 tablet by mouth 3 (three) times daily. 270 tablet 3  . carboxymethylcellulose (REFRESH PLUS) 0.5 % SOLN Place 1 drop into both eyes 3 (three) times daily as needed (dry eyes).    . Cholecalciferol (VITAMIN D-3) 1000 UNITS CAPS Take 1,000 Units by mouth daily.    . Cinnamon 500 MG capsule Take 500 mg by mouth daily.     . clindamycin (CLEOCIN) 150 MG capsule Take four (4) capsules (600 mg total) by mouth as needed one (1) hour prior to dental work.    . clonazePAM (KLONOPIN) 0.5 MG tablet Take 1 tablet (0.5 mg total) by mouth at bedtime. 90 tablet 3  . CRESTOR 5 MG tablet TAKE ONE TABLET BY MOUTH ONCE  DAILY 30 tablet 11  . DHA-EPA-Vitamin E [id number] MG-MG-UNIT CAPS Take 1 capsule by mouth 2 (two) times daily as needed (dry eyes).     Marland Kitchen donepezil (ARICEPT) 10 MG tablet Take 1 tablet (10 mg total) by mouth at bedtime. 90 tablet 3  . entacapone (COMTAN) 200 MG tablet Take 1 tablet (200 mg total) by mouth 3 (three) times daily. 270 tablet 3  . EPIPEN 2-PAK 0.3 MG/0.3ML SOAJ Inject 0.3 mg into the muscle once as needed. Allergic reaction    . fluticasone (FLONASE) 50 MCG/ACT nasal spray Place 2 sprays into the nose daily as needed for allergies.     Marland Kitchen levothyroxine (SYNTHROID, LEVOTHROID) 125 MCG tablet Take 125 mcg by mouth daily.      Marland Kitchen lisinopril (PRINIVIL,ZESTRIL) 40 MG tablet TAKE ONE TABLET BY MOUTH ONCE DAILY 30 tablet 11  . OVER THE COUNTER MEDICATION Take 1 tablet by mouth 2 (two) times daily. Macular degeneration supplement    . OVER THE COUNTER MEDICATION Take 1 capsule by mouth daily. Homocysteine supplement    . warfarin (COUMADIN) 5 MG tablet TAKE ONE TABLET BY MOUTH AS DIRECTED 40 tablet 3   No current facility-administered medications for this visit.    Allergies  Allergen Reactions  . Bee Venom Anaphylaxis  . Other Anaphylaxis    Other reaction(s): Laryngeal Edema (ALLERGY) "a heart medication" ? unknown  .  Tamsulosin Other (See Comments)    Insomnia  . Penicillins Rash  . Tetanus Toxoids Rash    Social History   Social History  . Marital Status: Married    Spouse Name: N/A  . Number of Children: 3  . Years of Education: BS   Occupational History  . retired    Social History Main Topics  . Smoking status: Former Smoker -- 15 years    Types: Cigarettes    Quit date: 10/07/2008  . Smokeless tobacco: Never Used  . Alcohol Use: 1.2 - 1.8 oz/week    2-3 Glasses of wine per week     Comment: OCC.  . Drug Use: No  . Sexual Activity: No   Other Topics Concern  . Not on file   Social History Narrative   Patient lives at home with his wife   Patient is  right handed   Patient drinks coffee     Review of Systems: General: negative for chills, fever, night sweats or weight changes.  Cardiovascular: negative for chest pain, dyspnea on exertion, edema, orthopnea, palpitations, paroxysmal nocturnal dyspnea or shortness of breath Dermatological: negative for rash Respiratory: negative for cough or wheezing Urologic: negative for hematuria Abdominal: negative for nausea, vomiting, diarrhea, bright red blood per rectum, melena, or hematemesis Neurologic: negative for visual changes, syncope, or dizziness All other systems reviewed and are otherwise negative except as noted above.    Blood pressure 116/72, pulse 54, height 5\' 8"  (1.727 m), weight 194 lb 12.8 oz (88.361 kg).  General appearance: alert, cooperative and no distress Neck: no carotid bruit and no JVD Lungs: clear to auscultation bilaterally Heart: regular rate and rhythm, S1, S2 normal, no murmur, click, rub or gallop Extremities: no LEE Pulses: 2+ and symmetric Skin: warm and dry Neurologic: Grossly normal  EKG sinus bradycardia. 54 bpm. bifascicular block. Unchanged compared to prior.   ASSESSMENT AND PLAN:   1. CAD: s/p CABG. Stable w/o CP or dyspnea. EKG unchanged. Continue medical therapy: statin, BB + ACE-I. ASA discontinued 6 months ago since he was having a lot of bruising and is on warfarin.  2. AAA: status post Endovascular stent graft repair of abdominal aortic aneurysm in 2011 with no evidence of active endoleak. Aneurysm sac relatively stable at 5.1 x 5.4 cm, during recent assessment earlier this month. This is followed by Dr. Kellie Simmering.   3. PAF: EKG shows SR. He denies any symptoms of breakthrough atrial fibrillation. HR is controlled on BB. Continue Warfarin for a/c. INRs followed routinely in our clinic and have been stable. No abnormal bleeding of falls.   4. OSA:  using mouth appliance  5. Dyslipidemia: lipids are at goal. LDL was 45 mg/DL 12/08/15. ALT  normal.  Continue Crestor/fish oil   6. HTN: Controlled. Continue Lisinopril/Bystolic/amlodipine    PLAN  F/u with Dr. Radford Pax in 6 months.   Lyda Jester PA-C 12/22/2015 11:49 AM

## 2015-12-26 DIAGNOSIS — G513 Clonic hemifacial spasm: Secondary | ICD-10-CM | POA: Diagnosis not present

## 2015-12-29 ENCOUNTER — Encounter: Payer: Self-pay | Admitting: Neurology

## 2015-12-29 ENCOUNTER — Ambulatory Visit (INDEPENDENT_AMBULATORY_CARE_PROVIDER_SITE_OTHER): Payer: Medicare Other | Admitting: Neurology

## 2015-12-29 VITALS — BP 118/82 | HR 58 | Resp 16 | Ht 68.0 in | Wt 192.0 lb

## 2015-12-29 DIAGNOSIS — F0390 Unspecified dementia without behavioral disturbance: Secondary | ICD-10-CM

## 2015-12-29 DIAGNOSIS — M7989 Other specified soft tissue disorders: Secondary | ICD-10-CM

## 2015-12-29 DIAGNOSIS — G20 Parkinson's disease: Secondary | ICD-10-CM

## 2015-12-29 DIAGNOSIS — G5139 Clonic hemifacial spasm, unspecified: Secondary | ICD-10-CM

## 2015-12-29 DIAGNOSIS — G518 Other disorders of facial nerve: Secondary | ICD-10-CM | POA: Diagnosis not present

## 2015-12-29 DIAGNOSIS — G4752 REM sleep behavior disorder: Secondary | ICD-10-CM | POA: Diagnosis not present

## 2015-12-29 MED ORDER — MEMANTINE HCL ER 7 MG PO CP24: 7.0000 mg | ORAL_CAPSULE | Freq: Every day | ORAL | Status: DC

## 2015-12-29 NOTE — Patient Instructions (Signed)
We will continue with your Sinemet CR 1 pill 3 times a day, clonazepam 0.5 mg each night, Aricept 10 mg daily, Comtan 200 mg 1 pill 3 times a day.   I would like to suggest starting you on a second memory medication, Namenda XR, starting at 7 mg once daily with gradual build up. Side effects include: nausea, confusion, hallucination, personality changes. If you are having mild side effects, try to stick with the treatment as these initial side effects may go away after the first 10-14 days.

## 2015-12-29 NOTE — Progress Notes (Signed)
Subjective:    Patient ID: Troy [name] is a 75 y.o. male.  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 Maudry Mayhew), who presents for follow-up consultation of his parkinsonism. The patient is accompanied by his wife again today. I last saw him on 08/23/2015, at which time he reported deterioration of his speech. He was in outpatient PT, OT and ST. She felt that his memory was worse. He did not do well with carbidopa-levodopa 4 times a day and his wife called on 03/31/2015 to change it back to CR. He was back on Sinemet CR 50-200 milligrams one tablet 3 times a day. He was exercising at the gym twice a day. He had finished LSVT-BIG at neuro rehab. He was on Aricept and had been on it for about 3 years. He was sleeping fairly well. Unfortunately, his wife had some surgical complications and medical setbacks. I asked him to continue with Aricept generic 10 mg once daily, Sinemet CR one tablet 3 times a day, and Comtan 3 times a day. He was also on clonazepam 0.5 mg at bedtime. I asked him to monitor his leg swelling and start using compression stockings to his legs.  Today, 12/29/2015: He reports feeling okay. He has had more memory issues per wife.  He tends to be more forgetful and sometimes confused. He has been on Aricept generic for at least 3 years. He has been taking Sinemet CR 3 times a day along with Comtan generic. He does go to the gym and works out with a trainer twice a week but has not been walking regularly in  between. His wife still has issues with her knee and therefore has not been able to walk as much with him. They are planning on getting back on a more regular exercise regimen.  Previously:   I saw him on 03/07/2015, at which time he asked whether he could drive. His wife was supposed to have knee replacement surgery in July 2016 and he needed a way to get to his therapy appointments. He had benefit from outpatient occupational, physical and speech therapy in the past. He was working with a Physiological scientist 2 times a week as well. He had fallen in the past repeatedly. He felt his memory was about the same. He had some more daytime somnolence. He felt that this happened especially after taking Sinemet. He was taking Sinemet long-acting 3 times a day, 50-200 milligrams strength. He was taking Comtan 3 times a day. He had not changed his medication regimen well. He had no recent falls. His left hemifacial spasm had remained the same. He was sleeping a little better after starting clonazepam. I suggested we switch him to immediate release Sinemet, 25-100 milligrams strength, 1 pill 4 times a day and I suggested we continue Comtan 200 mg 3 times a day. I suggested he start using lower extremity compression stockings to help with the pressure maintenance and lower extremity swelling reduction. I asked him no longer to drive.  I first met him on 08/23/2014 at the request of Dr. Erlinda Hong, at which time the patient reported 5 year  history of parkinsonism. I kept him on the current medication regimen. 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   He was diagnosed with parkinsonism about 5 years ago. He was placed on Sinemet and continues to take Sinemet ER 50-200 mg strength 3 times a day. Comtan was added recently about 4 months ago. 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 Maudry Mayhew for hemifacial spasm. Previous workup included MRI brain last year showing brain atrophy and small vessel white matter changes. 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 He also had LP done in January 2015 which did not show evidence of NPH, and the CSF was clean.   He noted more difficulty with his gait after stopping the comtan, so he re-started it. His sleep is better. He has had some memory loss. He has been on Aricept for about 2 years and memory has been stable. He has no VH, or AH. He has no mood issues.    Her Past Medical History Is Significant For: Past Medical History  Diagnosis Date  . Constipation   . Diabetes mellitus   . Facial nerve spasm     L eye, tx'd with botox  . Fecal impaction ([place])   . Hypertension   . Hypothyroidism   . Hyperlipidemia   . Diverticulosis   . Gout   . CKD (chronic kidney disease) stage 2, GFR 60-89 ml/min   . OSA (obstructive sleep apnea)     intolerant to CPAP followed by Dr. Lamonte Sakai  . AAA (abdominal aortic aneurysm) ([place])     with mural thrombus  . Hepatic steatosis   . Coronary artery disease 07/2009    severe 3 vessel ASCAD s/p CABG with LIMA to LAD, SVG to diag, SVG to PL  . PAF (paroxysmal atrial fibrillation) ([place])   . Chronic anticoagulation     Her Past Surgical History Is Significant For: Past Surgical History  Procedure Laterality Date  . Coronary artery bypass graft  07-2009  . Spine surgery  1998  . Eye surgery Left May  2013    Cataract  . Eye surgery Right June 2013    Cataract  . Abdominal aortic aneurysm repair  01-2010    aorto-bi-iliac Gore Excluder stent graft    Her Family History Is Significant For: Family History  Problem Relation Age of Onset  . Heart disease Mother     CHF  . Alzheimer's disease Mother   . Alzheimer's disease Father   . Alzheimer's disease Brother   . Alzheimer's disease Paternal Uncle     Her Social History Is  Significant For: Social History   Social History  . Marital Status: Married    Spouse Name: N/A  . Number of Children: 3  . Years of Education: BS   Occupational History  . retired    Social History Main Topics  . Smoking status: Former Smoker -- 15 years    Types: Cigarettes    Quit date: 10/07/2008  . Smokeless tobacco: Never Used  . Alcohol Use: 1.2 - 1.8 oz/week    2-3 Glasses of wine per week     Comment: OCC.  . Drug Use: No  . Sexual Activity: No   Other Topics Concern  . None   Social History Narrative   Patient lives at home with his wife  Patient is right handed   Patient drinks coffee    Her Allergies Are:  Allergies  Allergen Reactions  . Bee Venom Anaphylaxis  . Other Anaphylaxis    Other reaction(s): Laryngeal Edema (ALLERGY) "a heart medication" ? unknown  . Tamsulosin Other (See Comments)    Insomnia  . Penicillins Rash  . Tetanus Toxoids Rash  :   Her Current Medications Are:  Outpatient Encounter Prescriptions as of 12/29/2015  Medication Sig  . amLODipine (NORVASC) 10 MG tablet TAKE ONE TABLET BY MOUTH ONCE DAILY  . BYSTOLIC 5 MG tablet TAKE ONE TABLET BY MOUTH ONCE DAILY  . carbidopa-levodopa (SINEMET CR) 50-200 MG tablet Take 1 tablet by mouth 3 (three) times daily.  . carboxymethylcellulose (REFRESH PLUS) 0.5 % SOLN Place 1 drop into both eyes 3 (three) times daily as needed (dry eyes).  . Cholecalciferol (VITAMIN D-3) 1000 UNITS CAPS Take 1,000 Units by mouth daily.  . Cinnamon 500 MG capsule Take 500 mg by mouth daily.   . clindamycin (CLEOCIN) 150 MG capsule Take four (4) capsules (600 mg total) by mouth as needed one (1) hour prior to dental work.  . clonazePAM (KLONOPIN) 0.5 MG tablet Take 1 tablet (0.5 mg total) by mouth at bedtime.  . CRESTOR 5 MG tablet TAKE ONE TABLET BY MOUTH ONCE DAILY  . DHA-EPA-Vitamin E 191-478-2 MG-MG-UNIT CAPS Take 1 capsule by mouth 2 (two) times daily as needed (dry eyes).   Marland Kitchen donepezil (ARICEPT) 10 MG  tablet Take 1 tablet (10 mg total) by mouth at bedtime.  . entacapone (COMTAN) 200 MG tablet Take 1 tablet (200 mg total) by mouth 3 (three) times daily.  Marland Kitchen EPIPEN 2-PAK 0.3 MG/0.3ML SOAJ Inject 0.3 mg into the muscle once as needed. Allergic reaction  . fluticasone (FLONASE) 50 MCG/ACT nasal spray Place 2 sprays into the nose daily as needed for allergies.   Marland Kitchen levothyroxine (SYNTHROID, LEVOTHROID) 125 MCG tablet Take 125 mcg by mouth daily.    Marland Kitchen lisinopril (PRINIVIL,ZESTRIL) 40 MG tablet TAKE ONE TABLET BY MOUTH ONCE DAILY  . OVER THE COUNTER MEDICATION Take 1 tablet by mouth 2 (two) times daily. Macular degeneration supplement  . OVER THE COUNTER MEDICATION Take 1 capsule by mouth daily. Homocysteine supplement  . warfarin (COUMADIN) 5 MG tablet TAKE ONE TABLET BY MOUTH AS DIRECTED   No facility-administered encounter medications on file as of 12/29/2015.  :  Review of Systems:  Out of a complete 14 point review of systems, all are reviewed and negative with the exception of these symptoms as listed below:   Review of Systems  Neurological:       Wife reports that they are concerned about the patient's memory.     Objective:  Neurologic Exam  Physical Exam Physical Examination:   Filed Vitals:   12/29/15 1502  BP: 118/82  Pulse: 58  Resp: 16    General Examination: The patient is a very pleasant 75 y.o. male in no acute distress.  HEENT: Normocephalic, atraumatic, pupils are equal, round and reactive to light and accommodation. Extraocular tracking shows very mild saccadic breakdown without nystagmus noted. He is s/p cataract repairs. There is limitation to upper gaze. There is a very mild decrease in eye blink rate. L eyelid is droopy, slightly better, s/p eyelid surgery. Hearing is impaired mildly. 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 There is no lip, neck or jaw tremor.  Neck is minimally rigid with intact passive ROM. There are no carotid bruits on auscultation. Oropharynx exam reveals moderate mouth dryness. No significant airway crowding is noted. Mallampati is class II. Tongue protrudes centrally and palate elevates symmetrically. There is mild drooling.   Chest: is clear to auscultation without wheezing, rhonchi or crackles noted.  Heart: sounds are regular and normal without murmurs, rubs or gallops noted.   Abdomen: is soft, non-tender and non-distended with normal bowel sounds appreciated on auscultation.  Extremities: There is 2+ pitting edema in the distal lower extremities bilaterally, right worse than left. Pedal pulses are intact. Chronic stasis-like changes are noted in the distal legs bilaterally, petechial changes are seem.   Skin: is warm and dry with no trophic changes noted. Age-related changes are noted on the skin.   Musculoskeletal: exam reveals no obvious joint deformities, tenderness, joint swelling or erythema.  Neurologically:  Mental status: The patient is awake and alert, paying good  attention. He is able to partially provide the history. His wife provides most of the history. He is oriented to: person, place, situation, day of week, month of year and year. His memory, attention, language and knowledge are impaired. There is no aphasia, agnosia, apraxia or anomia. There is a mild degree of bradyphrenia. Speech is mildly hypophonic with mild dysarthria noted. Mood is congruent and affect is normal.   On 12/29/2015: MMSE: 18/30, CDT: 1/4, AFT: 4/min.  Cranial nerves are as described above under HEENT exam. In addition, shoulder shrug is normal with equal shoulder height noted.  Motor exam: Normal bulk, and strength for age is noted. There are no dyskinesias noted. Tone is mildly rigid with absence of cogwheeling in the extremities. There is overall mild bradykinesia. There is no drift or rebound. There is no tremor.   Romberg is  negative.  Reflexes are 1+ in the upper extremities and 1+ in the lower extremities.   Fine motor skills exam: Finger taps , hand movements and rapid alternating patting are mild to moderately impaired, left-sided little worse. Foot taps and foot agility of mild to moderately impaired bilaterally.  Cerebellar testing shows no dysmetria or intention tremor on finger to nose testing. Heel to shin is unremarkable bilaterally. There is no truncal or gait ataxia.   Sensory exam is intact to light touch in the upper and lower extremities.   Gait, station and balance: He stands up from the seated position with mild difficulty and does need to push up with His hands. He needs no assistance. No veering to one side is noted. He is not noted to lean to the side. Posture is moderately stooped, stable. Mild start hesitation. No freezing is noted. Stance is narrow-based. He walks with decrease in stride length and pace and decreased arm swing on both sides. He has trouble turning. Balance is mildly impaired.   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 He has been on Comtan and long-acting Sinemet, he could not tolerate the IR. For memory loss he has been on Aricept for the past few  years. His MMSE indicates moderate memory loss. At this juncture, would like to add Namenda long-acting 7 mg once daily. I discussed expectations, and potential side effects with the patient  and his wife. I would like to keep him on the current dose of Sinemet long-acting and Comtan. He also takes low-dose clonazepam 0.5 mg at night 4 REM behavior disorder and has been doing well with this dose. He has overall mild to moderate parkinsonism but no significant lateralization and mild progression, speech is worse, but may be confounded by his L HFS. He has had some response to Sinemet.  He is advised to stay better hydrated with water, elevate his legs when sitting, and walk regularly for exercise. He no longer drives.  I provided him with a new prescription for Namenda XR  7 mg strength with instructions in writing. He did not need refills on his other medications. I would like to see him back in about 4 months, sooner if needed. I suggested they call for any interim questions or concerns. I spent 25 minutes in total face-to-face time with the patient, more than 50% of which was spent in counseling and coordination of care, reviewing test results, reviewing medication and discussing or reviewing the diagnosis of Parkinsonism, memory loss, the prognosis and treatment options.

## 2016-01-10 ENCOUNTER — Other Ambulatory Visit: Payer: Self-pay | Admitting: Cardiology

## 2016-01-17 ENCOUNTER — Ambulatory Visit (INDEPENDENT_AMBULATORY_CARE_PROVIDER_SITE_OTHER): Payer: Medicare Other | Admitting: *Deleted

## 2016-01-17 DIAGNOSIS — Z5181 Encounter for therapeutic drug level monitoring: Secondary | ICD-10-CM | POA: Diagnosis not present

## 2016-01-17 DIAGNOSIS — I4891 Unspecified atrial fibrillation: Secondary | ICD-10-CM | POA: Diagnosis not present

## 2016-01-17 LAB — POCT INR: INR: 2.6

## 2016-01-26 DIAGNOSIS — L570 Actinic keratosis: Secondary | ICD-10-CM | POA: Diagnosis not present

## 2016-02-09 DIAGNOSIS — H40013 Open angle with borderline findings, low risk, bilateral: Secondary | ICD-10-CM | POA: Diagnosis not present

## 2016-02-09 DIAGNOSIS — Z961 Presence of intraocular lens: Secondary | ICD-10-CM | POA: Diagnosis not present

## 2016-02-09 DIAGNOSIS — H26492 Other secondary cataract, left eye: Secondary | ICD-10-CM | POA: Diagnosis not present

## 2016-02-09 DIAGNOSIS — H353130 Nonexudative age-related macular degeneration, bilateral, stage unspecified: Secondary | ICD-10-CM | POA: Diagnosis not present

## 2016-02-28 ENCOUNTER — Other Ambulatory Visit: Payer: Self-pay | Admitting: Neurology

## 2016-02-28 ENCOUNTER — Ambulatory Visit (INDEPENDENT_AMBULATORY_CARE_PROVIDER_SITE_OTHER): Payer: Medicare Other | Admitting: *Deleted

## 2016-02-28 DIAGNOSIS — Z5181 Encounter for therapeutic drug level monitoring: Secondary | ICD-10-CM

## 2016-02-28 DIAGNOSIS — I4891 Unspecified atrial fibrillation: Secondary | ICD-10-CM

## 2016-02-28 LAB — POCT INR: INR: 2.2

## 2016-02-29 ENCOUNTER — Other Ambulatory Visit: Payer: Self-pay | Admitting: Neurology

## 2016-03-01 ENCOUNTER — Other Ambulatory Visit: Payer: Self-pay | Admitting: Neurology

## 2016-03-02 ENCOUNTER — Other Ambulatory Visit: Payer: Self-pay | Admitting: Neurology

## 2016-03-02 NOTE — Telephone Encounter (Signed)
Pt's wife called requesting refill for clonazePAM (KLONOPIN) 0.5 MG tablet . [facility] can not fill due to date on RX is past 6 mths.

## 2016-03-05 ENCOUNTER — Other Ambulatory Visit: Payer: Self-pay | Admitting: Neurology

## 2016-03-05 MED ORDER — CLONAZEPAM 0.5 MG PO TABS: 0.5000 mg | ORAL_TABLET | Freq: Every day | ORAL | Status: DC

## 2016-03-05 NOTE — Telephone Encounter (Signed)
Patient's wife is calling and states that the pharmacy [place], [facility] state they have not received the Rx clonazePAM 0.5 mg tablets. Thanks!

## 2016-03-05 NOTE — Telephone Encounter (Signed)
Shredded printed rx by Dr Rexene Alberts since she is not in office. Murtis Sink, RN was witness. Re printed rx for Dr Felecia Shelling to sign [facility]) tomorrow morning. Awaiting signature.

## 2016-03-06 NOTE — Telephone Encounter (Signed)
Faxed printed rx signed by Dr Felecia Shelling. Fax: [phone]. Received confirmation.

## 2016-03-26 ENCOUNTER — Other Ambulatory Visit: Payer: Self-pay | Admitting: Cardiology

## 2016-03-31 ENCOUNTER — Other Ambulatory Visit: Payer: Self-pay | Admitting: Cardiology

## 2016-04-02 DIAGNOSIS — G513 Clonic hemifacial spasm: Secondary | ICD-10-CM | POA: Diagnosis not present

## 2016-04-13 ENCOUNTER — Ambulatory Visit (INDEPENDENT_AMBULATORY_CARE_PROVIDER_SITE_OTHER): Payer: Medicare Other | Admitting: *Deleted

## 2016-04-13 DIAGNOSIS — I4891 Unspecified atrial fibrillation: Secondary | ICD-10-CM

## 2016-04-13 DIAGNOSIS — Z5181 Encounter for therapeutic drug level monitoring: Secondary | ICD-10-CM | POA: Diagnosis not present

## 2016-04-13 LAB — POCT INR: INR: 2.6

## 2016-05-03 ENCOUNTER — Ambulatory Visit (INDEPENDENT_AMBULATORY_CARE_PROVIDER_SITE_OTHER): Payer: Medicare Other | Admitting: Neurology

## 2016-05-03 ENCOUNTER — Encounter: Payer: Self-pay | Admitting: Neurology

## 2016-05-03 VITALS — BP 132/76 | HR 70 | Resp 16 | Ht 68.0 in | Wt 195.0 lb

## 2016-05-03 DIAGNOSIS — F0390 Unspecified dementia without behavioral disturbance: Secondary | ICD-10-CM

## 2016-05-03 DIAGNOSIS — G4752 REM sleep behavior disorder: Secondary | ICD-10-CM | POA: Diagnosis not present

## 2016-05-03 DIAGNOSIS — G20 Parkinson's disease: Secondary | ICD-10-CM

## 2016-05-03 DIAGNOSIS — G518 Other disorders of facial nerve: Secondary | ICD-10-CM | POA: Diagnosis not present

## 2016-05-03 DIAGNOSIS — G5139 Clonic hemifacial spasm, unspecified: Secondary | ICD-10-CM

## 2016-05-03 MED ORDER — MEMANTINE HCL ER 14 MG PO CP24: 14.0000 mg | ORAL_CAPSULE | Freq: Every day | ORAL | Status: DC

## 2016-05-03 MED ORDER — CARBIDOPA-LEVODOPA ER 50-200 MG PO TBCR: 1.0000 | EXTENDED_RELEASE_TABLET | Freq: Four times a day (QID) | ORAL | Status: DC

## 2016-05-03 MED ORDER — DONEPEZIL HCL 10 MG PO TABS: 10.0000 mg | ORAL_TABLET | Freq: Every day | ORAL | Status: DC

## 2016-05-03 NOTE — Progress Notes (Signed)
Subjective:    Patient ID: Troy [name] is a 75 y.o. male.  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 Maudry Mayhew), who presents for follow-up consultation of his parkinsonism. The patient is accompanied by his wife again today. I last saw him on 12/29/2015, at which time he reported feeling okay. His wife reported more memory issues, including forgetfulness and at times confusion. He has been on Aricept generic for at least 3 years. He has been taking Sinemet CR 3 times a day along with Comtan generic. Was active physically and would work out with a trainer twice a week but not so much in between. I suggested he continue with his Parkinson's medication but asked him to start on Namenda long-acting 7 mg once daily for memory loss. His MMSE was 18 out of 30 at the time.  Today, 05/03/2016: He reports doing fairly well. His wife reports most of his history. She says that he is tolerating the Namenda quite well. He is still quite sleepy during the day. She is wondering if it is because of Comtan. He was not able to tolerate Sinemet IR and we kept him on Sinemet CR 3 times a day along with Comtan generic. In addition, Comtan generic is very expensive. He has complaints of feeling sleepy during the day. Memory is stable. He still takes Aricept at night and has been taking the Namenda long-acting in the morning. He has questions about erectile dysfunction which I answered. I explained to him that given his medical and cardiac  history he is probably not a candidate for trying any ED medication. I did explain to him the Parkinson's patients and parkinsonism patients often complain of ED.  Previously:   I saw him on 08/23/2015, at which time he reported deterioration of his speech. He was in outpatient PT, OT and ST. She felt that his memory was worse. He did not do well with carbidopa-levodopa 4 times a day and his wife called on 03/31/2015 to change it back to CR. He was back on Sinemet CR 50-200 milligrams one tablet 3 times a day. He was exercising at the gym twice a day. He had finished LSVT-BIG at neuro rehab. He was on Aricept and had been on it for about 3 years. He was sleeping fairly well. Unfortunately, his wife had some surgical complications and medical setbacks. I asked him to continue with Aricept generic 10 mg once daily, Sinemet CR one tablet 3 times a day, and Comtan 3 times a day. He was also on clonazepam 0.5 mg at bedtime. I asked him to monitor his leg swelling and start using compression stockings to his legs.  I saw him on 03/07/2015, at which time he asked whether he could drive. His wife was supposed to have knee replacement surgery in July 2016 and he needed a way to get to his therapy appointments. He had benefit from outpatient occupational, physical and speech therapy in the past. He was working with a Physiological scientist 2 times a week as well. He had fallen in the past repeatedly. He felt his memory was about the same.  He had some more daytime somnolence. He felt that this happened especially after taking Sinemet. He was taking Sinemet long-acting 3 times a day, 50-200 milligrams strength. He was taking Comtan 3 times a day. He had not changed his medication regimen well. He had no recent falls. His left hemifacial spasm had remained the same. He was sleeping a little better after starting clonazepam. I suggested we switch him to immediate release Sinemet, 25-100 milligrams strength, 1 pill 4 times a day  and I suggested we continue Comtan 200 mg 3 times a day. I suggested he start using lower extremity compression stockings to help with the pressure maintenance and lower extremity swelling reduction. I asked him no longer to drive.  I first met him on 08/23/2014 at the request of Dr. Erlinda Hong, at which time the patient reported 5 year history of parkinsonism. I kept him on the current medication regimen. 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   He was diagnosed with parkinsonism about 5 years ago. He was placed on Sinemet and continues to take Sinemet ER 50-200 mg strength 3 times a day. Comtan was added recently about 4 months ago. 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 Maudry Mayhew for hemifacial spasm. Previous workup included MRI brain last year showing brain atrophy and small vessel white matter changes. 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 He also had LP done in January 2015 which did not show evidence of NPH, and the CSF was clean.   He noted more difficulty with his gait after stopping the comtan, so he re-started it. His sleep is better. He has had some memory loss. He has been on Aricept for about 2 years and memory has been stable. He has no VH, or AH. He has no mood issues.    His Past Medical History Is Significant For: Past Medical History  Diagnosis Date  . Constipation   . Diabetes mellitus   . Facial nerve spasm     L eye, tx'd with botox  . Fecal impaction ([place])   . Hypertension   . Hypothyroidism   . Hyperlipidemia   . Diverticulosis   . Gout   . CKD (chronic kidney disease) stage 2, GFR 60-89 ml/min   . OSA (obstructive sleep apnea)     intolerant to CPAP followed by Dr. Lamonte Sakai  . AAA (abdominal aortic aneurysm) ([place])     with mural thrombus  . Hepatic  steatosis   . Coronary artery disease 07/2009    severe 3 vessel ASCAD s/p CABG with LIMA to LAD, SVG to diag, SVG to PL  . PAF (paroxysmal atrial fibrillation) ([place])   . Chronic anticoagulation     His Past Surgical History Is Significant For: Past Surgical History  Procedure Laterality Date  . Coronary artery bypass graft  07-2009  . Spine surgery  1998  . Eye surgery Left May  2013    Cataract  . Eye surgery Right June 2013    Cataract  . Abdominal aortic aneurysm repair  01-2010    aorto-bi-iliac Gore Excluder stent graft    His Family History Is Significant For: Family History  Problem Relation Age of Onset  . Heart disease Mother     CHF  . Alzheimer's disease Mother   . Alzheimer's disease Father   . Alzheimer's  disease Brother   . Alzheimer's disease Paternal Uncle     His Social History Is Significant For: Social History   Social History  . Marital Status: Married    Spouse Name: N/A  . Number of Children: 3  . Years of Education: BS   Occupational History  . retired    Social History Main Topics  . Smoking status: Former Smoker -- 15 years    Types: Cigarettes    Quit date: 10/07/2008  . Smokeless tobacco: Never Used  . Alcohol Use: 1.2 - 1.8 oz/week    2-3 Glasses of wine per week     Comment: OCC.  . Drug Use: No  . Sexual Activity: No   Other Topics Concern  . None   Social History Narrative   Patient lives at home with his wife   Patient is right handed   Patient drinks coffee    His Allergies Are:  Allergies  Allergen Reactions  . Bee Venom Anaphylaxis  . Other Anaphylaxis    Other reaction(s): Laryngeal Edema (ALLERGY) "a heart medication" ? unknown  . Tamsulosin Other (See Comments)    Insomnia  . Penicillins Rash  . Tetanus Toxoids Rash  :   His Current Medications Are:  Outpatient Encounter Prescriptions as of 05/03/2016  Medication Sig  . amLODipine (NORVASC) 10 MG tablet TAKE ONE TABLET BY MOUTH ONCE DAILY  . BYSTOLIC 5  MG tablet TAKE ONE TABLET BY MOUTH ONCE DAILY  . carbidopa-levodopa (SINEMET CR) 50-200 MG tablet Take 1 tablet by mouth 4 (four) times daily. 7, 11, 3 pm and 7 pm  . carboxymethylcellulose (REFRESH PLUS) 0.5 % SOLN Place 1 drop into both eyes 3 (three) times daily as needed (dry eyes).  . Cholecalciferol (VITAMIN D-3) 1000 UNITS CAPS Take 1,000 Units by mouth daily.  . Cinnamon 500 MG capsule Take 500 mg by mouth daily.   . clindamycin (CLEOCIN) 150 MG capsule Take four (4) capsules (600 mg total) by mouth as needed one (1) hour prior to dental work.  . clonazePAM (KLONOPIN) 0.5 MG tablet Take 1 tablet (0.5 mg total) by mouth at bedtime.  . CRESTOR 5 MG tablet TAKE ONE TABLET BY MOUTH ONCE DAILY  . DHA-EPA-Vitamin E 937-902-4 MG-MG-UNIT CAPS Take 1 capsule by mouth 2 (two) times daily as needed (dry eyes).   Marland Kitchen donepezil (ARICEPT) 10 MG tablet Take 1 tablet (10 mg total) by mouth at bedtime.  Marland Kitchen EPIPEN 2-PAK 0.3 MG/0.3ML SOAJ Inject 0.3 mg into the muscle once as needed. Allergic reaction  . fluticasone (FLONASE) 50 MCG/ACT nasal spray Place 2 sprays into the nose daily as needed for allergies.   Marland Kitchen levothyroxine (SYNTHROID, LEVOTHROID) 125 MCG tablet Take 125 mcg by mouth daily.    Marland Kitchen lisinopril (PRINIVIL,ZESTRIL) 40 MG tablet TAKE ONE TABLET BY MOUTH ONCE DAILY  . OVER THE COUNTER MEDICATION Take 1 tablet by mouth 2 (two) times daily. Macular degeneration supplement  . OVER THE COUNTER MEDICATION Take 1 capsule by mouth daily. Homocysteine supplement  . warfarin (COUMADIN) 5 MG tablet TAKE ONE TABLET BY MOUTH ONCE DAILY AS DIRECTED  . [DISCONTINUED] carbidopa-levodopa (SINEMET CR) 50-200 MG tablet Take 1 tablet by mouth 3 (three) times daily.  . [DISCONTINUED] donepezil (ARICEPT) 10 MG tablet Take 1 tablet (10 mg total) by mouth at bedtime.  . [DISCONTINUED] entacapone (COMTAN) 200 MG tablet Take 1 tablet (200 mg total) by mouth 3 (three) times daily.  . [DISCONTINUED] memantine (NAMENDA XR) 7 MG  CP24  24 hr capsule Take 1 capsule (7 mg total) by mouth daily.  . memantine (NAMENDA XR) 14 MG CP24 24 hr capsule Take 1 capsule (14 mg total) by mouth daily.   No facility-administered encounter medications on file as of 05/03/2016.  :  Review of Systems:  Out of a complete 14 point review of systems, all are reviewed and negative with the exception of these symptoms as listed below:   Review of Systems  Neurological:       Patient states that he is tolerating Namenda well.  Still having trouble with falling asleep with combination of Sinemet and Comtan.      Objective:  Neurologic Exam  Physical Exam Physical Examination:   Filed Vitals:   05/03/16 1532  BP: 132/76  Pulse: 70  Resp: 16    General Examination: The patient is a very pleasant 75 y.o. male in no acute distress.  HEENT: Normocephalic, atraumatic, pupils are equal, round and reactive to light and accommodation. Extraocular tracking shows mild saccadic breakdown without nystagmus noted. He is s/p cataract repairs. There is limitation to upper gaze. There is a very mild decrease in eye blink rate. L eyelid is droopy, slightly better, s/p eyelid surgery. Hearing is impaired mildly. 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 There is no lip, neck or jaw tremor. Neck is minimally rigid with intact passive ROM. There are no carotid bruits on auscultation. Oropharynx exam reveals moderate mouth dryness. No significant airway crowding is noted. Mallampati is class II. Tongue protrudes centrally and palate elevates symmetrically. There is mild drooling.   Chest: is clear to auscultation without wheezing, rhonchi or crackles noted.  Heart: sounds are regular and normal without murmurs, rubs or gallops noted.   Abdomen: is soft, non-tender and non-distended with normal bowel sounds appreciated on auscultation.  Extremities:  There is 1+ pitting edema in the distal lower extremities bilaterally, right worse than left. Pedal pulses are intact.   Skin: is warm and dry with no trophic changes noted. Age-related changes are noted on the skin.   Musculoskeletal: exam reveals no obvious joint deformities, tenderness, joint swelling or erythema.  Neurologically:  Mental status: The patient is awake and alert, paying good  attention. He is able to partially provide the history. His wife provides most of the history. He is oriented to: person, place, situation, day of week, month of year and year. His memory, attention, language and knowledge are impaired. There is no aphasia, agnosia, apraxia or anomia. There is a mild degree of bradyphrenia. Speech is mildly hypophonic with mild dysarthria noted. Mood is congruent and affect is normal.   On 12/29/2015: MMSE: 18/30, CDT: 1/4, AFT: 4/min.  On 05/03/2016: MMSE: 17/30, CDT: 1/4, AFT: 3/min.  Cranial nerves are as described above under HEENT exam. In addition, shoulder shrug is normal with equal shoulder height noted.  Motor exam: Normal bulk, and strength for age is noted. There are no dyskinesias noted. Tone is mildly rigid with absence of cogwheeling in the extremities. There is overall mild bradykinesia. There is no drift or rebound. There is no tremor.   Romberg is negative.  Reflexes are 1+ in the upper extremities and 1+ in the lower extremities.   Fine motor skills exam: Finger taps , hand movements and rapid alternating patting are mild to moderately impaired, left-sided little worse. Foot taps and foot agility of mild to moderately impaired bilaterally.  Cerebellar testing shows no dysmetria or intention tremor on finger to nose testing. Heel to shin is unremarkable bilaterally. There is no truncal or gait ataxia.   Sensory exam is intact to light touch in the upper and lower extremities.   Gait, station and balance: He stands up from the seated position with mild  difficulty and does need to push up with His hands. He needs no assistance. No veering to one side is noted. He is not noted to lean to the side. Posture is moderately stooped, stable. Mild start hesitation, but no freezing is noted. Stance is narrow-based. He walks with decrease in stride length and pace and decreased arm swing on both sides. He has trouble turning. Balance is mildly impaired.   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 He has been on Comtan and long-acting Sinemet, and he could not tolerate the IR. For memory loss he has been on Aricept for the past few years. Last time we added long-acting Namenda 7 mg once daily which he has been able to tolerate. His memory scores are stable. I would like to increase the Namenda to 14 mg once daily long-acting. I adjusted his prescription in that regard. She can continue with clonazepam at night but we mutually agreed to take him off of Comtan but I would like to increase the Sinemet CR to 1 pill 4 times a day. I would like to see whether his sleepiness during the day improves without Comtan and we can continue with the Sinemet CR only. He will take it approximately at 7 AM, 11 AM, 3 PM, and 7 PM. He has overall mild to moderate parkinsonism but no significant lateralization and mild progression, speech is worse, but may be confounded by his L HFS. He has had some response to Sinemet in the past years. He is  advised to well hydrated with water, elevate his legs when sitting, and walk regularly for exercise. He no longer drives.  I provided him with a new prescription for Namenda XR 14 mg strength with instructions in writing. I adjusted his prescription for Sinemet CR and discontinued his prescription for Comtan. I would like to see him back in about 4 months, sooner if needed. I suggested they call for any interim questions or concerns. I spent 25 minutes in total face-to-face time with the patient, more than 50% of which was spent in counseling and coordination of care, reviewing test results, reviewing medication and discussing or reviewing the diagnosis of Parkinsonism, memory loss, the prognosis and treatment options.

## 2016-05-03 NOTE — Patient Instructions (Signed)
We will increase the Namenda XR to 14 mg daily.   We will increase the Sinemet CR to 1 pill 4 times a day: at 7, 11, 3 PM and 7 PM.   We will stop the Comtan at this time.   We will continue with clonazepam.

## 2016-05-04 DIAGNOSIS — K118 Other diseases of salivary glands: Secondary | ICD-10-CM | POA: Insufficient documentation

## 2016-05-04 DIAGNOSIS — K119 Disease of salivary gland, unspecified: Secondary | ICD-10-CM | POA: Diagnosis not present

## 2016-05-16 ENCOUNTER — Other Ambulatory Visit: Payer: Self-pay | Admitting: Otolaryngology

## 2016-05-16 DIAGNOSIS — K118 Other diseases of salivary glands: Secondary | ICD-10-CM

## 2016-05-23 ENCOUNTER — Ambulatory Visit
Admission: RE | Admit: 2016-05-23 | Discharge: 2016-05-23 | Disposition: A | Discharge status: Home / Self Care | Payer: Medicare Other | Source: Ambulatory Visit | Attending: Otolaryngology | Admitting: Otolaryngology

## 2016-05-23 DIAGNOSIS — K118 Other diseases of salivary glands: Secondary | ICD-10-CM

## 2016-05-24 DIAGNOSIS — Z85828 Personal history of other malignant neoplasm of skin: Secondary | ICD-10-CM | POA: Diagnosis not present

## 2016-05-24 DIAGNOSIS — D1801 Hemangioma of skin and subcutaneous tissue: Secondary | ICD-10-CM | POA: Diagnosis not present

## 2016-05-24 DIAGNOSIS — L817 Pigmented purpuric dermatosis: Secondary | ICD-10-CM | POA: Diagnosis not present

## 2016-05-24 DIAGNOSIS — L821 Other seborrheic keratosis: Secondary | ICD-10-CM | POA: Diagnosis not present

## 2016-05-24 DIAGNOSIS — L820 Inflamed seborrheic keratosis: Secondary | ICD-10-CM | POA: Diagnosis not present

## 2016-05-25 ENCOUNTER — Ambulatory Visit (INDEPENDENT_AMBULATORY_CARE_PROVIDER_SITE_OTHER): Payer: Medicare Other | Admitting: *Deleted

## 2016-05-25 DIAGNOSIS — I4891 Unspecified atrial fibrillation: Secondary | ICD-10-CM

## 2016-05-25 DIAGNOSIS — Z5181 Encounter for therapeutic drug level monitoring: Secondary | ICD-10-CM

## 2016-05-25 LAB — POCT INR: INR: 3

## 2016-05-28 ENCOUNTER — Other Ambulatory Visit: Payer: Self-pay | Admitting: Neurology

## 2016-05-31 ENCOUNTER — Ambulatory Visit (INDEPENDENT_AMBULATORY_CARE_PROVIDER_SITE_OTHER): Payer: Medicare Other | Admitting: Cardiology

## 2016-05-31 ENCOUNTER — Encounter: Payer: Self-pay | Admitting: Cardiology

## 2016-05-31 VITALS — BP 120/60 | HR 52 | Ht 68.0 in | Wt 193.0 lb

## 2016-05-31 DIAGNOSIS — I2583 Coronary atherosclerosis due to lipid rich plaque: Principal | ICD-10-CM

## 2016-05-31 DIAGNOSIS — I10 Essential (primary) hypertension: Secondary | ICD-10-CM

## 2016-05-31 DIAGNOSIS — I480 Paroxysmal atrial fibrillation: Secondary | ICD-10-CM | POA: Diagnosis not present

## 2016-05-31 DIAGNOSIS — G4733 Obstructive sleep apnea (adult) (pediatric): Secondary | ICD-10-CM

## 2016-05-31 DIAGNOSIS — I2510 Atherosclerotic heart disease of native coronary artery without angina pectoris: Secondary | ICD-10-CM | POA: Diagnosis not present

## 2016-05-31 DIAGNOSIS — E785 Hyperlipidemia, unspecified: Secondary | ICD-10-CM

## 2016-05-31 MED ORDER — ROSUVASTATIN CALCIUM 5 MG PO TABS: 5.0000 mg | ORAL_TABLET | Freq: Every day | ORAL | Status: DC

## 2016-05-31 NOTE — Patient Instructions (Signed)
Medication Instructions:  Your physician recommends that you continue on your current medications as directed. Please refer to the Current Medication list given to you today.   Labwork: Your physician recommends that you return for FASTING lab work.  Testing/Procedures: None ordered.  Follow-Up: Your physician wants you to follow-up in: 6 months with Dr. Radford Pax. You will receive a reminder letter in the mail two months in advance. If you don't receive a letter, please call our office to schedule the follow-up appointment.   Any Other Special Instructions Will Be Listed Below (If Applicable).     If you need a refill on your cardiac medications before your next appointment, please call your pharmacy.

## 2016-05-31 NOTE — Progress Notes (Signed)
Cardiology Office Note    Date:  05/31/2016   ID:  SUHAAS GAUER, DOB 08/01/1941, MRN [id number]  PCP:  Lilian Coma, MD  Cardiologist:  Fransico Him, MD   Chief Complaint  Patient presents with  . Coronary Artery Disease  . Hypertension  . Hyperlipidemia  . Sleep Apnea    History of Present Illness:  Troy [name] is a 75 y.o. male with a history of ASCAD s/p CABG, HTN, PAF, dyslipidemia, OSA now using oral device, dyslipidemia and atrial fibrillation who presents today for followup. He denies any chest pain, SOB, DOE, dizziness, palpitations or syncope. He occasionally has some RLE edema which is stable. He tolerates the oral device for OSA. Occasionally he will snore but according to his wife it is positional.  He feels rested when he gets up but dose continue to have daytime sleepiness with his Parkinson's meds.    Past Medical History  Diagnosis Date  . Constipation   . Diabetes mellitus   . Facial nerve spasm     L eye, tx'd with botox  . Fecal impaction ([place])   . Hypertension   . Hypothyroidism   . Hyperlipidemia   . Diverticulosis   . Gout   . CKD (chronic kidney disease) stage 2, GFR 60-89 ml/min   . OSA (obstructive sleep apnea)     intolerant to CPAP followed by Dr. Lamonte Sakai  . AAA (abdominal aortic aneurysm) ([place])     with mural thrombus  . Hepatic steatosis   . Coronary artery disease 07/2009    severe 3 vessel ASCAD s/p CABG with LIMA to LAD, SVG to diag, SVG to PL  . PAF (paroxysmal atrial fibrillation) ([facility])   . Chronic anticoagulation     Past Surgical History  Procedure Laterality Date  . Coronary artery bypass graft  07-2009  . Spine surgery  1998  . Eye surgery Left May  2013    Cataract  . Eye surgery Right June 2013    Cataract  . Abdominal aortic aneurysm repair  01-2010    aorto-bi-iliac Gore Excluder stent graft    Current Medications: Outpatient Prescriptions Prior to Visit  Medication Sig Dispense Refill  . amLODipine  (NORVASC) 10 MG tablet TAKE ONE TABLET BY MOUTH ONCE DAILY 30 tablet 11  . BYSTOLIC 5 MG tablet TAKE ONE TABLET BY MOUTH ONCE DAILY 30 tablet 6  . carbidopa-levodopa (SINEMET CR) 50-200 MG tablet Take 1 tablet by mouth 4 (four) times daily. 7, 11, 3 pm and 7 pm 360 tablet 3  . carboxymethylcellulose (REFRESH PLUS) 0.5 % SOLN Place 1 drop into both eyes 3 (three) times daily as needed (dry eyes).    . Cholecalciferol (VITAMIN D-3) 1000 UNITS CAPS Take 2,000 Units by mouth daily.     . Cinnamon 500 MG capsule Take 500 mg by mouth daily.     . clonazePAM (KLONOPIN) 0.5 MG tablet TAKE ONE TABLET BY MOUTH AT BEDTIME 90 tablet 0  . CRESTOR 5 MG tablet TAKE ONE TABLET BY MOUTH ONCE DAILY 30 tablet 11  . DHA-EPA-Vitamin E [id number] MG-MG-UNIT CAPS Take 1 capsule by mouth 2 (two) times daily as needed (dry eyes).     Marland Kitchen donepezil (ARICEPT) 10 MG tablet Take 1 tablet (10 mg total) by mouth at bedtime. 90 tablet 3  . EPIPEN 2-PAK 0.3 MG/0.3ML SOAJ Inject 0.3 mg into the muscle once as needed. Allergic reaction    . fluticasone (FLONASE) 50 MCG/ACT nasal spray Place 2 sprays  into the nose daily as needed for allergies.     Marland Kitchen levothyroxine (SYNTHROID, LEVOTHROID) 125 MCG tablet Take 125 mcg by mouth daily.      Marland Kitchen lisinopril (PRINIVIL,ZESTRIL) 40 MG tablet TAKE ONE TABLET BY MOUTH ONCE DAILY 30 tablet 11  . memantine (NAMENDA XR) 14 MG CP24 24 hr capsule Take 1 capsule (14 mg total) by mouth daily. 30 capsule 5  . OVER THE COUNTER MEDICATION Take 1 tablet by mouth 2 (two) times daily. Macular degeneration supplement    . OVER THE COUNTER MEDICATION Take 1 capsule by mouth daily. Homocysteine supplement    . warfarin (COUMADIN) 5 MG tablet TAKE ONE TABLET BY MOUTH ONCE DAILY AS DIRECTED 40 tablet 3  . clindamycin (CLEOCIN) 150 MG capsule Reported on 05/31/2016     No facility-administered medications prior to visit.     Allergies:   Bee venom; Other; Tamsulosin; Penicillins; and Tetanus toxoids   Social  History   Social History  . Marital Status: Married    Spouse Name: N/A  . Number of Children: 3  . Years of Education: BS   Occupational History  . retired    Social History Main Topics  . Smoking status: Former Smoker -- 15 years    Types: Cigarettes    Quit date: 10/07/2008  . Smokeless tobacco: Never Used  . Alcohol Use: 1.2 - 1.8 oz/week    2-3 Glasses of wine per week     Comment: OCC.  . Drug Use: No  . Sexual Activity: No   Other Topics Concern  . None   Social History Narrative   Patient lives at home with his wife   Patient is right handed   Patient drinks coffee     Family History:  The patient's family history includes Alzheimer's disease in his brother, father, mother, and paternal uncle; Heart disease in his mother.   ROS:   Please see the history of present illness.    ROS All other systems reviewed and are negative.   PHYSICAL EXAM:   VS:  BP 120/60 mmHg  Pulse 52  Ht 5\' 8"  (1.727 m)  Wt 193 lb (87.544 kg)  BMI 29.35 kg/m2   GEN: Well nourished, well developed, in no acute distress HEENT: normal Neck: no JVD, carotid bruits, or masses Cardiac: RRR; no murmurs, rubs, or gallops.  1+ LE edema R>L.  Intact distal pulses bilaterally.  Respiratory:  clear to auscultation bilaterally, normal work of breathing GI: soft, nontender, nondistended, + BS MS: no deformity or atrophy Skin: warm and dry, no rash Neuro:  Alert and Oriented x 3, Strength and sensation are intact Psych: euthymic mood, full affect  Wt Readings from Last 3 Encounters:  05/31/16 193 lb (87.544 kg)  05/03/16 195 lb (88.451 kg)  12/29/15 192 lb (87.091 kg)      Studies/Labs Reviewed:   EKG:  EKG is not ordered today.   Recent Labs: 11/18/2015: Creatinine, IStat 0.9 12/08/2015: ALT 14   Lipid Panel    Component Value Date/Time   CHOL 114* 12/08/2015 0849   TRIG 167* 12/08/2015 0849   HDL 36* 12/08/2015 0849   CHOLHDL 3.2 12/08/2015 0849   VLDL 33* 12/08/2015 0849    LDLCALC 45 12/08/2015 0849    Additional studies/ records that were reviewed today include:  none    ASSESSMENT:    1. Coronary artery disease due to lipid rich plaque   2. Essential hypertension   3. Paroxysmal atrial fibrillation (HCC)  4. OSA (obstructive sleep apnea)   5. Hyperlipidemia      PLAN:  In order of problems listed above:  1. ASCAD s/p CABG with no angina. Continue statin/BB.  Not on ASA due to warfarin. 2. HTN - BP controlled on current medical regimen. Continue BB/amlodipine/ACE I. 3. PAF - maintaining NSR.  Continue BB and warfarin. 4. OSA tolerating his oral device 5. Hyperlipidemia - LDL goal is < 70.  Continue statin.  Check FLP and ALT.     Medication Adjustments/Labs and Tests Ordered: Current medicines are reviewed at length with the patient today.  Concerns regarding medicines are outlined above.  Medication changes, Labs and Tests ordered today are listed in the Patient Instructions below.  There are no Patient Instructions on file for this visit.   Signed, Fransico Him, MD  05/31/2016 10:09 AM    [facility] [address], [place], [place]  [address] Phone: [phone]; Fax: [phone]

## 2016-06-01 ENCOUNTER — Other Ambulatory Visit (INDEPENDENT_AMBULATORY_CARE_PROVIDER_SITE_OTHER): Payer: Medicare Other | Admitting: *Deleted

## 2016-06-01 DIAGNOSIS — I10 Essential (primary) hypertension: Secondary | ICD-10-CM

## 2016-06-01 DIAGNOSIS — E785 Hyperlipidemia, unspecified: Secondary | ICD-10-CM | POA: Diagnosis not present

## 2016-06-01 DIAGNOSIS — I480 Paroxysmal atrial fibrillation: Secondary | ICD-10-CM | POA: Diagnosis not present

## 2016-06-01 LAB — BASIC METABOLIC PANEL
BUN: 11 mg/dL (ref 7–25)
CALCIUM: 8.9 mg/dL (ref 8.6–10.3)
CO2: 28 mmol/L (ref 20–31)
Chloride: 104 mmol/L (ref 98–110)
Creat: 0.81 mg/dL (ref 0.70–1.18)
Glucose, Bld: 115 mg/dL — ABNORMAL HIGH (ref 65–99)
Potassium: 3.9 mmol/L (ref 3.5–5.3)
SODIUM: 142 mmol/L (ref 135–146)

## 2016-06-01 LAB — HEPATIC FUNCTION PANEL
ALK PHOS: 52 U/L (ref 40–115)
ALT: 7 U/L — ABNORMAL LOW (ref 9–46)
AST: 15 U/L (ref 10–35)
Albumin: 4.1 g/dL (ref 3.6–5.1)
BILIRUBIN DIRECT: 0.2 mg/dL (ref <=0.2)
BILIRUBIN INDIRECT: 0.6 mg/dL (ref 0.2–1.2)
BILIRUBIN TOTAL: 0.8 mg/dL (ref 0.2–1.2)
Total Protein: 6.3 g/dL (ref 6.1–8.1)

## 2016-06-01 LAB — LIPID PANEL
CHOL/HDL RATIO: 2.7 ratio (ref <=5.0)
Cholesterol: 110 mg/dL — ABNORMAL LOW (ref 125–200)
HDL: 41 mg/dL (ref >=40)
LDL CALC: 48 mg/dL (ref <130)
Triglycerides: 106 mg/dL (ref <150)
VLDL: 21 mg/dL (ref <30)

## 2016-06-03 ENCOUNTER — Encounter: Payer: Self-pay | Admitting: Neurology

## 2016-07-06 ENCOUNTER — Ambulatory Visit (INDEPENDENT_AMBULATORY_CARE_PROVIDER_SITE_OTHER): Payer: Medicare Other | Admitting: *Deleted

## 2016-07-06 DIAGNOSIS — I4891 Unspecified atrial fibrillation: Secondary | ICD-10-CM | POA: Diagnosis not present

## 2016-07-06 DIAGNOSIS — Z5181 Encounter for therapeutic drug level monitoring: Secondary | ICD-10-CM

## 2016-07-06 LAB — POCT INR: INR: 2.9

## 2016-08-01 DIAGNOSIS — G513 Clonic hemifacial spasm: Secondary | ICD-10-CM | POA: Diagnosis not present

## 2016-08-09 DIAGNOSIS — H01003 Unspecified blepharitis right eye, unspecified eyelid: Secondary | ICD-10-CM | POA: Diagnosis not present

## 2016-08-09 DIAGNOSIS — H04123 Dry eye syndrome of bilateral lacrimal glands: Secondary | ICD-10-CM | POA: Diagnosis not present

## 2016-08-09 DIAGNOSIS — G245 Blepharospasm: Secondary | ICD-10-CM | POA: Diagnosis not present

## 2016-08-09 DIAGNOSIS — H40013 Open angle with borderline findings, low risk, bilateral: Secondary | ICD-10-CM | POA: Diagnosis not present

## 2016-08-17 ENCOUNTER — Ambulatory Visit (INDEPENDENT_AMBULATORY_CARE_PROVIDER_SITE_OTHER): Payer: Medicare Other | Admitting: *Deleted

## 2016-08-17 DIAGNOSIS — I4891 Unspecified atrial fibrillation: Secondary | ICD-10-CM | POA: Diagnosis not present

## 2016-08-17 DIAGNOSIS — Z5181 Encounter for therapeutic drug level monitoring: Secondary | ICD-10-CM | POA: Diagnosis not present

## 2016-08-17 LAB — POCT INR: INR: 2.1

## 2016-08-24 DIAGNOSIS — Z23 Encounter for immunization: Secondary | ICD-10-CM | POA: Diagnosis not present

## 2016-08-25 ENCOUNTER — Other Ambulatory Visit: Payer: Self-pay | Admitting: Neurology

## 2016-08-27 NOTE — Telephone Encounter (Signed)
Rx faxed in.

## 2016-09-03 ENCOUNTER — Other Ambulatory Visit: Payer: Self-pay | Admitting: Cardiology

## 2016-09-06 ENCOUNTER — Ambulatory Visit (INDEPENDENT_AMBULATORY_CARE_PROVIDER_SITE_OTHER): Payer: Medicare Other | Admitting: Neurology

## 2016-09-06 ENCOUNTER — Other Ambulatory Visit: Payer: Self-pay

## 2016-09-06 ENCOUNTER — Encounter: Payer: Self-pay | Admitting: Neurology

## 2016-09-06 VITALS — BP 128/78 | HR 76 | Resp 16 | Ht 68.0 in | Wt 198.0 lb

## 2016-09-06 DIAGNOSIS — I2510 Atherosclerotic heart disease of native coronary artery without angina pectoris: Secondary | ICD-10-CM

## 2016-09-06 DIAGNOSIS — I2583 Coronary atherosclerosis due to lipid rich plaque: Secondary | ICD-10-CM | POA: Diagnosis not present

## 2016-09-06 DIAGNOSIS — F0390 Unspecified dementia without behavioral disturbance: Secondary | ICD-10-CM

## 2016-09-06 DIAGNOSIS — G20 Parkinson's disease: Secondary | ICD-10-CM

## 2016-09-06 MED ORDER — DONEPEZIL HCL 10 MG PO TABS: 10.0000 mg | ORAL_TABLET | Freq: Every day | ORAL | 3 refills | Status: DC

## 2016-09-06 MED ORDER — CLONAZEPAM 0.25 MG PO TBDP: 0.7500 mg | ORAL_TABLET | Freq: Every day | ORAL | 5 refills | Status: DC

## 2016-09-06 MED ORDER — CARBIDOPA-LEVODOPA ER 50-200 MG PO TBCR: 1.0000 | EXTENDED_RELEASE_TABLET | Freq: Four times a day (QID) | ORAL | 3 refills | Status: DC

## 2016-09-06 NOTE — Progress Notes (Signed)
Subjective:    Patient ID: Troy [name] is a 75 y.o. male.  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 Maudry Mayhew), who presents for follow-up consultation of his parkinsonism. The patient is accompanied by his wife again today. I last saw him on 05/03/2016: H, at which time he reported doing fairly well. His wife reported that he was tolerating Namenda. He was quite sleepy during the day. He was not able to tolerate Sinemet immediate release and we went back to Sinemet CR 3 times a day along with Comtan. Comtan generic was very expensive. Memory was stable per the report. He was complaining of erectile dysfunction. MMSE was 17 out of 30, clock drawing was 1 out of 4 and animal fluency was 3/m, fairly stable findings. I suggested we increase Sinemet CR to 1 pill 4 times a day and stop the Comtan. He was encouraged to increase Namenda long-acting to 14 mg once daily. I continued his clonazepam.  Today, 09/06/2016: He reports doing okay, but still sleepy during the day, about the same, no Repercussions after coming off of Comtan. The Sinemet CR at 4 times a day seem to make his sleepiness worse. Of note, he does not sleep very well at night, he also uses a dental appliance at night for sleep apnea but it has been bothering him lately. He may be sleepy during the day because of various factors. 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 He follows with Dr. Radford Pax in cardiology and is  stable. He will see Dr. Toy Cookey for his dental appliance soon. Wife had knee surgery and has done better. He does not walk very much or exercise on a daily basis, works with a trainer twice a week.    Previously:    I saw him on 12/29/2015, at which time he reported feeling okay. His wife reported more memory issues, including forgetfulness and at times confusion. He has been on Aricept generic for at least 3 years. He has been taking Sinemet CR 3 times a day along with Comtan generic. Was active physically and would work out with a trainer twice a week but not so much in between. I suggested he continue with his Parkinson's medication but asked him to start on Namenda long-acting 7 mg once daily for memory loss. His MMSE was 18 out of 30 at the time.    I saw him on 08/23/2015, at which time he reported deterioration of his speech. He was in outpatient PT, OT and ST. She felt that his memory was worse. He did not do well with carbidopa-levodopa 4 times a day and his wife called on 03/31/2015 to change it back to CR. He was back on Sinemet CR 50-200 milligrams one tablet 3 times a day. He was exercising at the gym twice a day. He had finished LSVT-BIG at neuro rehab. He was on Aricept and had been on it for about 3 years. He was sleeping fairly well. Unfortunately, his wife had some surgical complications and medical setbacks. I asked him to continue with Aricept generic 10 mg  once daily, Sinemet CR one tablet 3 times a day, and Comtan 3 times a day. He was also on clonazepam 0.5 mg at bedtime. I asked him to monitor his leg swelling and start using compression stockings to his legs.   I saw him on 03/07/2015, at which time he asked whether he could drive. His wife was supposed to have knee replacement surgery in July 2016 and he needed a way to get to his therapy appointments. He had benefit from outpatient occupational, physical and speech therapy in the past. He was working with a Physiological scientist 2  times a week as well. He had fallen in the past repeatedly. He felt his memory was about the same. He had some more daytime somnolence. He felt that this happened especially after taking Sinemet. He was taking Sinemet long-acting 3 times a day, 50-200 milligrams strength. He was taking Comtan 3 times a day. He had not changed his medication regimen well. He had no recent falls. His left hemifacial spasm had remained the same. He was sleeping a little better after starting clonazepam. I suggested we switch him to immediate release Sinemet, 25-100 milligrams strength, 1 pill 4 times a day and I suggested we continue Comtan 200 mg 3 times a day. I suggested he start using lower extremity compression stockings to help with the pressure maintenance and lower extremity swelling reduction. I asked him no longer to drive.   I first met him on 08/23/2014 at the request of Dr. Erlinda Hong, at which time the patient reported 5 year history of parkinsonism. I kept him on the current medication regimen. 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    He was diagnosed with parkinsonism about 5 years ago. He was placed on Sinemet and continues to take Sinemet ER 50-200 mg strength 3 times a day. Comtan was added recently about 4 months ago. 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 Maudry Mayhew for hemifacial spasm. Previous workup included MRI brain last year showing brain atrophy and small vessel white matter changes. 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 He also had LP done in January 2015 which did not show evidence of NPH, and the CSF was clean.   He noted more difficulty with his gait after stopping the comtan, so he re-started it. His sleep is better. He has had some memory loss. He has been on Aricept for about 2  years and memory has been stable. He has no VH, or AH. He has no mood issues.    His Past Medical History Is Significant For: Past Medical History:  Diagnosis Date  . AAA (abdominal aortic aneurysm) ([place])    with mural thrombus  . Chronic anticoagulation   . CKD (chronic kidney disease) stage 2, GFR 60-89 ml/min   . Constipation   . Coronary artery disease 07/2009   severe 3 vessel ASCAD s/p CABG with LIMA to LAD, SVG to diag, SVG to PL  . Diabetes mellitus   . Diverticulosis   . Facial nerve spasm    L eye, tx'd with botox  . Fecal impaction ([place])   . Gout   . Hepatic steatosis   . Hyperlipidemia   . Hypertension   . Hypothyroidism   . OSA (obstructive sleep apnea)    intolerant to CPAP followed by Dr. Lamonte Sakai  .  PAF (paroxysmal atrial fibrillation) ([place])     His Past Surgical History Is Significant For: Past Surgical History:  Procedure Laterality Date  . ABDOMINAL AORTIC ANEURYSM REPAIR  01-2010   aorto-bi-iliac Gore Excluder stent graft  . CORONARY ARTERY BYPASS GRAFT  07-2009  . EYE SURGERY Left May  2013   Cataract  . EYE SURGERY Right June 2013   Cataract  . [place]    His Family History Is Significant For: Family History  Problem Relation Age of Onset  . Heart disease Mother     CHF  . Alzheimer's disease Mother   . Alzheimer's disease Father   . Alzheimer's disease Brother   . Alzheimer's disease Paternal Uncle     His Social History Is Significant For: Social History   Social History  . Marital status: Married    Spouse name: N/A  . Number of children: 3  . Years of education: BS   Occupational History  . retired    Social History Main Topics  . Smoking status: Former Smoker    Years: 15.00    Types: Cigarettes    Quit date: 10/07/2008  . Smokeless tobacco: Never Used  . Alcohol use 1.2 - 1.8 oz/week    2 - 3 Glasses of wine per week     Comment: OCC.  . Drug use: No  . Sexual activity: No   Other Topics Concern  . None    Social History Narrative   Patient lives at home with his wife   Patient is right handed   Patient drinks coffee    His Allergies Are:  Allergies  Allergen Reactions  . Bee Venom Anaphylaxis  . Other Anaphylaxis    Other reaction(s): Laryngeal Edema (ALLERGY) "a heart medication" ? unknown  . Tamsulosin Other (See Comments)    Insomnia  . Penicillins Rash  . Tetanus Toxoids Rash  :   His Current Medications Are:  Outpatient Encounter Prescriptions as of 09/06/2016  Medication Sig  . amLODipine (NORVASC) 10 MG tablet TAKE ONE TABLET BY MOUTH ONCE DAILY  . BYSTOLIC 5 MG tablet TAKE ONE TABLET BY MOUTH ONCE DAILY  . carbidopa-levodopa (SINEMET CR) 50-200 MG tablet Take 1 tablet by mouth 4 (four) times daily. 7, 11, 3 pm and 7 pm (Patient taking differently: Take 1 tablet by mouth 3 (three) times daily. 7, 11, 3 pm and 7 pm)  . carboxymethylcellulose (REFRESH PLUS) 0.5 % SOLN Place 1 drop into both eyes 3 (three) times daily as needed (dry eyes).  . Cholecalciferol (VITAMIN D-3) 1000 UNITS CAPS Take 2,000 Units by mouth daily.   . Cinnamon 500 MG capsule Take 1,000 mg by mouth daily.   . clindamycin (CLEOCIN) 150 MG capsule Reported on 05/31/2016  . clonazePAM (KLONOPIN) 0.5 MG tablet TAKE ONE TABLET BY MOUTH AT BEDTIME  . DHA-EPA-Vitamin E 956-213-0 MG-MG-UNIT CAPS Take 1 capsule by mouth 2 (two) times daily as needed (dry eyes).   Marland Kitchen donepezil (ARICEPT) 10 MG tablet Take 1 tablet (10 mg total) by mouth at bedtime.  Marland Kitchen EPIPEN 2-PAK 0.3 MG/0.3ML SOAJ Inject 0.3 mg into the muscle once as needed. Allergic reaction  . fluticasone (FLONASE) 50 MCG/ACT nasal spray Place 2 sprays into the nose daily as needed for allergies.   Marland Kitchen levothyroxine (SYNTHROID, LEVOTHROID) 125 MCG tablet Take 125 mcg by mouth daily.    Marland Kitchen lisinopril (PRINIVIL,ZESTRIL) 40 MG tablet TAKE ONE TABLET BY MOUTH ONCE DAILY  . memantine (NAMENDA XR) 14  MG CP24 24 hr capsule Take 1 capsule (14 mg total) by mouth daily.  Marland Kitchen  OVER THE COUNTER MEDICATION Take 1 tablet by mouth 2 (two) times daily. Macular degeneration supplement  . OVER THE COUNTER MEDICATION Take 1 capsule by mouth daily. Homocysteine supplement  . rosuvastatin (CRESTOR) 5 MG tablet Take 1 tablet (5 mg total) by mouth daily.  Marland Kitchen warfarin (COUMADIN) 5 MG tablet TAKE ONE TABLET BY MOUTH ONCE DAILY AS DIRECTED   No facility-administered encounter medications on file as of 09/06/2016.   :  Review of Systems:  Out of a complete 14 point review of systems, all are reviewed and negative with the exception of these symptoms as listed below:  Review of Systems  Neurological:       Wife states that patient is tolerating the increase of Namenda.  Patient states that he still feels very fatigue and has increased weakness in his legs. He has stopped the Comtan and decreased the Sinemet to 3 times a day.     Objective:  Neurologic Exam  Physical Exam Physical Examination:   Vitals:   09/06/16 1404  BP: 128/78  Pulse: 76  Resp: 16   General Examination: The patient is a very pleasant 75 y.o. male in no acute distress.   HEENT: Normocephalic, atraumatic, pupils are equal, round and reactive to light and accommodation. Extraocular tracking shows mild saccadic breakdown without nystagmus noted. He is s/p cataract repairs. There is limitation to upper gaze. There is a very mild decrease in eye blink rate. L eyelid is droopy, slightly better, s/p eyelid surgery. Hearing is impaired mildly. 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 There is no lip, neck or jaw tremor. Neck is minimally rigid with intact passive ROM. There are no carotid bruits on auscultation. Oropharynx exam reveals moderate mouth dryness, marginal Dental hygiene especially in the front 4 teeth on the lower jaw. No significant airway crowding is noted. Mallampati is class II. Tongue protrudes  centrally and palate elevates symmetrically. There is mild drooling.   Chest: is clear to auscultation without wheezing, rhonchi or crackles noted.  Heart: sounds are regular and normal without murmurs, rubs or gallops noted.   Abdomen: is soft, non-tender and non-distended with normal bowel sounds appreciated on auscultation.  Extremities: There is 1+ pitting edema in the distal lower extremities bilaterally, right worse than left. Pedal pulses are intact.   Skin: is warm and dry with no trophic changes noted. Age-related changes are noted on the skin.   Musculoskeletal: exam reveals no obvious joint deformities, tenderness, joint swelling or erythema.  Neurologically:  Mental status: The patient is awake and alert, paying good  attention. He is able to partially provide the history. His wife provides most of the history. He is oriented to: person, place, situation, day of week, month of year and year. His memory, attention, language and knowledge are impaired. There is no aphasia, agnosia, apraxia or anomia. There is a mild degree of bradyphrenia. Speech is mildly hypophonic with mild dysarthria noted. Mood is congruent and affect is normal.   On 12/29/2015: MMSE: 18/30, CDT: 1/4, AFT: 4/min.  On 05/03/2016: MMSE: 17/30, CDT: 1/4, AFT: 3/min.  Cranial nerves are as described above under HEENT exam. In addition, shoulder shrug is normal with equal shoulder height noted.  Motor exam: Normal bulk, and strength for age is noted. There are no dyskinesias noted. Tone is  mildly rigid with absence of cogwheeling in the extremities. There is overall mild bradykinesia. There is no drift or rebound. There is no tremor.   Romberg is negative.  Reflexes are 1+ in the upper extremities and 1+ in the lower extremities.   Fine motor skills exam: Finger taps , hand movements and rapid alternating patting are mild to moderately impaired, left-sided little worse. Foot taps and foot agility of mild to moderately  impaired bilaterally.  Cerebellar testing shows no dysmetria or intention tremor on finger to nose testing. Heel to shin is difficult.   Sensory exam is intact to light touch in the upper and lower extremities.   Gait, station and balance: He stands up from the seated position with mild difficulty and does need to push up with His hands. He needs no assistance. No veering to one side is noted. He is not noted to lean to the side. Posture is moderately stooped, stable. Mild start hesitation, but no freezing is noted. Stance is narrow-based. He walks with decrease in stride length and pace and decreased arm swing on both sides. He has trouble turning. Balance is mildly impaired.   Assessment and Plan:   In summary, Troy [name] is a very pleasant 75 year old male with an underlying complicated medical history including diabetes, hypertension, chronic constipation, hypothyroidism, hyperlipidemia, diverticulosis, gout, chronic kidney disease, OSA, intolerant to CPAPIn the distant past and has been using an oral appliance for this per Dr. 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 He was on Comtan and long-acting Sinemet, could not tolerate immediate release Sinemet, has been off of Comtan as of June 2017, without repercussions. He was on Sinemet CR one pill 4 times a day but continued to complain of daytime somnolence. He reduced it back to 3 times a day. Today, I suggested we increase his long-acting Namenda to 14 mg daily for his memory. In addition, I would like to go back on Sinemet CR one pill 4 times a day as his sleepiness is no different. He does not sleep very well at night. He has a history of REM behavior disorder. He has been on clonazepam 0.5 mg  each night for quite some time, would like to see if we can increase this to 0.75 mg each night. For his sleep apnea he has been using the dental appliance. He does complain about his appliance causing discomfort at this time. He has been followed for sleep apnea and his dental appliance by Dr. Toy Cookey. He is encouraged to make a FU appointment with her to see if he needs an adjustment on his oral appliance. Alternatively, we can consider doing repeat sleep study testing and try CPAP again. I would like to see him back in 4 months, sooner as needed. I answered all their questions today and they were in agreement.  I spent 40 minutes in total face-to-face time with the patient, more than 50% of which was spent in counseling and coordination of care, reviewing test results, reviewing medication and discussing or reviewing the diagnosis of Parkinsonism, memory loss, the prognosis and treatment options.

## 2016-09-06 NOTE — Patient Instructions (Signed)
1. We will increase the Namenda XR to 21 mg for your next refill.   2. We will go back on Sinemet CR to 1 pill 4 times a day, at 7 AM, 11 AM, 3 PM and 7 PM.  3. We will increase the clonazepam to 0.75 mg each night.   4. We may consider a sleep study if Dr. Toy Cookey feels that an adjustment of your dental appliance is not going to help enough.

## 2016-09-18 ENCOUNTER — Telehealth: Payer: Self-pay | Admitting: Cardiology

## 2016-09-18 NOTE — Telephone Encounter (Signed)
Yearly is fine.  

## 2016-09-18 NOTE — Telephone Encounter (Signed)
New Message  Pt wife call requesting to speak with RN about scheduling lab work for pt. No orders in the system. Please call back to discuss

## 2016-09-18 NOTE — Telephone Encounter (Signed)
Per DPR form, left message for patient that no labs are needed now, that yearly labs will be drawn unless changes are made. Instructed him to call back for further questions or concerns.

## 2016-09-28 ENCOUNTER — Ambulatory Visit (INDEPENDENT_AMBULATORY_CARE_PROVIDER_SITE_OTHER): Payer: Medicare Other | Admitting: *Deleted

## 2016-09-28 DIAGNOSIS — Z5181 Encounter for therapeutic drug level monitoring: Secondary | ICD-10-CM

## 2016-09-28 DIAGNOSIS — I4891 Unspecified atrial fibrillation: Secondary | ICD-10-CM

## 2016-09-28 LAB — POCT INR: INR: 2.8

## 2016-10-04 ENCOUNTER — Emergency Department (HOSPITAL_COMMUNITY)
Admission: EM | Admit: 2016-10-04 | Discharge: 2016-10-04 | Disposition: A | Discharge status: Home / Self Care | Payer: Medicare Other | Attending: Emergency Medicine | Admitting: Emergency Medicine

## 2016-10-04 ENCOUNTER — Encounter (HOSPITAL_COMMUNITY): Payer: Self-pay | Admitting: Family Medicine

## 2016-10-04 DIAGNOSIS — Z951 Presence of aortocoronary bypass graft: Secondary | ICD-10-CM | POA: Insufficient documentation

## 2016-10-04 DIAGNOSIS — S50811A Abrasion of right forearm, initial encounter: Secondary | ICD-10-CM | POA: Diagnosis not present

## 2016-10-04 DIAGNOSIS — E039 Hypothyroidism, unspecified: Secondary | ICD-10-CM | POA: Insufficient documentation

## 2016-10-04 DIAGNOSIS — Z7901 Long term (current) use of anticoagulants: Secondary | ICD-10-CM | POA: Insufficient documentation

## 2016-10-04 DIAGNOSIS — Y9301 Activity, walking, marching and hiking: Secondary | ICD-10-CM | POA: Insufficient documentation

## 2016-10-04 DIAGNOSIS — Z87891 Personal history of nicotine dependence: Secondary | ICD-10-CM | POA: Diagnosis not present

## 2016-10-04 DIAGNOSIS — I129 Hypertensive chronic kidney disease with stage 1 through stage 4 chronic kidney disease, or unspecified chronic kidney disease: Secondary | ICD-10-CM | POA: Insufficient documentation

## 2016-10-04 DIAGNOSIS — W19XXXA Unspecified fall, initial encounter: Secondary | ICD-10-CM | POA: Insufficient documentation

## 2016-10-04 DIAGNOSIS — N182 Chronic kidney disease, stage 2 (mild): Secondary | ICD-10-CM | POA: Diagnosis not present

## 2016-10-04 DIAGNOSIS — E1122 Type 2 diabetes mellitus with diabetic chronic kidney disease: Secondary | ICD-10-CM | POA: Diagnosis not present

## 2016-10-04 DIAGNOSIS — S40811A Abrasion of right upper arm, initial encounter: Secondary | ICD-10-CM | POA: Diagnosis present

## 2016-10-04 DIAGNOSIS — I2510 Atherosclerotic heart disease of native coronary artery without angina pectoris: Secondary | ICD-10-CM | POA: Insufficient documentation

## 2016-10-04 DIAGNOSIS — Y999 Unspecified external cause status: Secondary | ICD-10-CM | POA: Diagnosis not present

## 2016-10-04 DIAGNOSIS — Z79899 Other long term (current) drug therapy: Secondary | ICD-10-CM | POA: Diagnosis not present

## 2016-10-04 DIAGNOSIS — G20 Parkinson's disease: Secondary | ICD-10-CM | POA: Diagnosis not present

## 2016-10-04 DIAGNOSIS — Y92002 Bathroom of unspecified non-institutional (private) residence single-family (private) house as the place of occurrence of the external cause: Secondary | ICD-10-CM | POA: Insufficient documentation

## 2016-10-04 DIAGNOSIS — S50312A Abrasion of left elbow, initial encounter: Secondary | ICD-10-CM | POA: Diagnosis not present

## 2016-10-04 HISTORY — DX: Parkinsonism, unspecified: G20.C

## 2016-10-04 HISTORY — DX: Parkinson's disease: G20

## 2016-10-04 HISTORY — DX: Unspecified dementia, unspecified severity, without behavioral disturbance, psychotic disturbance, mood disturbance, and anxiety: F03.90

## 2016-10-04 NOTE — ED Triage Notes (Signed)
Patients spouse reports patient was walking to the bathroom when he fell from [place] symptoms(leg becoming weak). Pt injured his right arm and possibly hit his head. Currently, on Coumadin. Denies LOC.

## 2016-10-04 NOTE — ED Provider Notes (Signed)
Troy [name] Provider Note   CSN: [id number] Arrival date & time: 10/04/16  0247  By signing my name below, I, Troy [name], attest that this documentation has been prepared under the direction and in the presence of Troy Fraise, MD. Electronically Signed: Judithann [name], ED Scribe. 10/04/16. 3:40 AM.   History   Chief Complaint Chief Complaint  Patient presents with  . Fall    HPI Comments: Troy [name] is a 75 y.o. male with a hx of dementia, hypertension, parkinsonism, CKD, CAD, PAF, and DM who presents to the Emergency Department complaining of multiple abrasions to his right arm s/p fall that occurred 2 hours ago. He presents with an abrasion to his left elbow. He denies any pain. Pt explains that he went through a "leg freeze" consistent with his symptoms with parkinsonism when he woke up to go to bathroom which caused him to fall. Wife denies any head injuries or LOC. She states that pt had kllonopin prior to going to bed. No alleviating factors noted. Pt has not tried any medications PTA. He is currently on Coumadin for A-fib (level on 09/28/16 was 2.8). He has an allergy to Tamsulosin, tetanus toxoids, and penicillins. Pt denies any fever, chest pain, shortness of breath, headache, neck pain, nausea, vomiting, back pain, or any other symptoms.   Wife reports pt receives botox injections into face that cause left eye edema  The history is provided by the patient. No language interpreter was used.  Fall  This is a new problem. The current episode started 1 to 2 hours ago. The problem has been resolved. Pertinent negatives include no chest pain, no abdominal pain, no headaches and no shortness of breath. Nothing aggravates the symptoms. Nothing relieves the symptoms. He has tried nothing for the symptoms.    Past Medical History:  Diagnosis Date  . AAA (abdominal aortic aneurysm) ([place])    with mural thrombus  . Chronic anticoagulation   . CKD (chronic kidney  disease) stage 2, GFR 60-89 ml/min   . Constipation   . Coronary artery disease 07/2009   severe 3 vessel ASCAD s/p CABG with LIMA to LAD, SVG to diag, SVG to PL  . Dementia   . Diabetes mellitus   . Diverticulosis   . Facial nerve spasm    L eye, tx'd with botox  . Fecal impaction ([place])   . Gout   . Hepatic steatosis   . Hyperlipidemia   . Hypertension   . Hypothyroidism   . OSA (obstructive sleep apnea)    intolerant to CPAP followed by Dr. Lamonte Sakai  . PAF (paroxysmal atrial fibrillation) ([place])   . Parkinsonism [facility])     Patient Active Problem List   Diagnosis Date Noted  . 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 Parkinsonism ([place]) 07/09/2014  . Mild cognitive impairment 07/09/2014  . 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 Encounter for therapeutic drug monitoring 12/17/2013  . Endoleak post (EVAR) endovascular aneurysm repair ([place]) 11/10/2013  . Hypertension   . Hyperlipidemia   . CKD (chronic kidney disease) stage 2, GFR 60-89 ml/min   . OSA (obstructive sleep apnea)   . Hepatic steatosis   . Chronic anticoagulation   . Abnormal gait 09/28/2013  . 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 Atrial fibrillation ([place]) 08/28/2013  . AAA (abdominal aortic aneurysm) without rupture ([place]) 04/14/2013  . Aftercare following surgery of the circulatory system, [place] 04/14/2013  . Coronary artery disease 07/27/2009  Past Surgical History:  Procedure Laterality Date  . ABDOMINAL AORTIC ANEURYSM REPAIR  01-2010   aorto-bi-iliac Gore Excluder stent graft  . CORONARY ARTERY BYPASS GRAFT  07-2009  . EYE SURGERY Left May  2013   Cataract  . EYE SURGERY Right June 2013   Cataract  . [place] Medications    Prior to Admission medications   Medication Sig Start Date End Date Taking? Authorizing Provider  amLODipine (NORVASC) 10 MG tablet TAKE ONE TABLET BY MOUTH ONCE DAILY 01/10/16   Sueanne Margarita, MD  BYSTOLIC 5 MG tablet TAKE  ONE TABLET BY MOUTH ONCE DAILY 03/27/16   Sueanne Margarita, MD  carbidopa-levodopa (SINEMET CR) 50-200 MG tablet Take 1 tablet by mouth 4 (four) times daily. 7, 11, 3 pm and 7 pm 09/06/16   Star Age, MD  carboxymethylcellulose (REFRESH PLUS) 0.5 % SOLN Place 1 drop into both eyes 3 (three) times daily as needed (dry eyes).    Historical Provider, MD  Cholecalciferol (VITAMIN D-3) 1000 UNITS CAPS Take 2,000 Units by mouth daily.     Historical Provider, MD  Cinnamon 500 MG capsule Take 1,000 mg by mouth daily.     Historical Provider, MD  clindamycin (CLEOCIN) 150 MG capsule Reported on 05/31/2016 06/17/14   Historical Provider, MD  clonazePAM (KLONOPIN) 0.25 MG disintegrating tablet Take 3 tablets (0.75 mg total) by mouth at bedtime. 09/06/16   Star Age, MD  DHA-EPA-Vitamin E [id number] MG-MG-UNIT CAPS Take 1 capsule by mouth 2 (two) times daily as needed (dry eyes).     Historical Provider, MD  donepezil (ARICEPT) 10 MG tablet Take 1 tablet (10 mg total) by mouth at bedtime. 09/06/16   Star Age, MD  EPIPEN 2-PAK 0.3 MG/0.3ML SOAJ Inject 0.3 mg into the muscle once as needed. Allergic reaction 02/25/13   Historical Provider, MD  fluticasone (FLONASE) 50 MCG/ACT nasal spray Place 2 sprays into the nose daily as needed for allergies.     Historical Provider, MD  levothyroxine (SYNTHROID, LEVOTHROID) 125 MCG tablet Take 125 mcg by mouth daily.      Historical Provider, MD  lisinopril (PRINIVIL,ZESTRIL) 40 MG tablet TAKE ONE TABLET BY MOUTH ONCE DAILY 12/05/15   Sueanne Margarita, MD  memantine (NAMENDA XR) 14 MG CP24 24 hr capsule Take 1 capsule (14 mg total) by mouth daily. 05/03/16   Star Age, MD  OVER THE COUNTER MEDICATION Take 1 tablet by mouth 2 (two) times daily. Macular degeneration supplement    Historical Provider, MD  OVER THE COUNTER MEDICATION Take 1 capsule by mouth daily. Homocysteine supplement    Historical Provider, MD  rosuvastatin (CRESTOR) 5 MG tablet Take 1 tablet (5 mg total) by mouth  daily. 05/31/16   Sueanne Margarita, MD  warfarin (COUMADIN) 5 MG tablet TAKE ONE TABLET BY MOUTH ONCE DAILY AS DIRECTED 09/03/16   Sueanne Margarita, MD    Family History Family History  Problem Relation Age of Onset  . Heart disease Mother     CHF  . Alzheimer's disease Mother   . Alzheimer's disease Father   . Alzheimer's disease Brother   . Alzheimer's disease Paternal Uncle     Social History Social History  Substance Use Topics  . Smoking status: Former Smoker    Years: 15.00    Types: Cigarettes    Quit date: 10/07/2008  . Smokeless tobacco: Never Used  . Alcohol use 1.2 - 1.8 oz/week  2 - 3 Glasses of wine per week     Comment: A week     Allergies   Bee venom; Other; Tamsulosin; Penicillins; and Tetanus toxoids   Review of Systems Review of Systems  Constitutional: Negative for fever.  Respiratory: Negative for shortness of breath.   Cardiovascular: Negative for chest pain.  Gastrointestinal: Negative for abdominal pain, nausea and vomiting.  Musculoskeletal: Negative for back pain and neck pain.  Skin: Positive for wound (abrasions).  Neurological: Negative for headaches.  All other systems reviewed and are negative.    Physical Exam Updated Vital Signs BP 133/83 (BP Location: Left Arm)   Pulse 74   Temp 97.8 F (36.6 C) (Oral)   Resp 25   Ht 5\' 8"  (1.727 m)   Wt 194 lb (88 kg)   SpO2 96%   BMI 29.50 kg/m   Physical Exam  Nursing note and vitals reviewed.  CONSTITUTIONAL: Elderly and frail  HEAD: Normocephalic/atraumatic; no bruising noted EYES: EOMI/PERRL; left periorbital edema (chronic) ENMT: Mucous membranes moist; no facial trauma noted  NECK: supple no meningeal signs SPINE/BACK:entire spine nontender CV: S1/S2 noted, no murmurs/rubs/gallops noted LUNGS: Lungs are clear to auscultation bilaterally, no apparent distress ABDOMEN: soft, nontender, no rebound or guarding, bowel sounds noted throughout abdomen GU:no cva tenderness NEURO: Pt  is awake/alert/appropriate, moves all extremitiesx4.  No facial droop.   EXTREMITIES: pulses normal/equal, full ROM; scattered skin teras to forearm; no lacerations noted. All extremities/joints palpated/ranged and nontender  SKIN: warm, color normal PSYCH: no abnormalities of mood noted, alert and oriented to situation   ED Treatments / Results  DIAGNOSTIC STUDIES: Oxygen Saturation is 96% on RA, adequate by my interpretation.    COORDINATION OF CARE: 3:38 AM- Pt advised of plan for treatment and pt agrees. Pt will have abrasions cleaned and dressed here.    Labs (all labs ordered are listed, but only abnormal results are displayed) Labs Reviewed - No data to display  EKG  EKG Interpretation None       Radiology No results found.  Procedures Procedures (including critical care time)  Medications Ordered in ED Medications - No data to display   Initial Impression / Assessment and Plan / ED Course  Troy Fraise, MD has reviewed the triage vital signs and the nursing notes.   Clinical Course     Pt well appearing He is at baseline per wife He ambulates without difficulty Wounds bandaged by nursing No signs of extremity trauma No LOC reported, no head injury noted, no vomiting After long discussion, patient/wife decline further labs/imaging This is reasonable given his clinical appearance We discussed strict return precautions  Final Clinical Impressions(s) / ED Diagnoses   Final diagnoses:  Fall, initial encounter  Abrasion of right forearm, initial encounter    New Prescriptions New Prescriptions   No medications on file   I personally performed the services described in this documentation, which was scribed in my presence. The recorded information has been reviewed and is accurate.        Troy Fraise, MD 10/04/16 [phone]

## 2016-10-04 NOTE — Discharge Instructions (Signed)
°  SEEK IMMEDIATE MEDICAL ATTENTION IF: There is confusion or drowsiness (although children frequently become drowsy after injury).  You cannot awaken the injured person.  There is nausea (feeling sick to your stomach) or continued, forceful vomiting.  You notice dizziness or unsteadiness which is getting worse, or inability to walk.  You have convulsions or unconsciousness.  You experience severe, persistent headaches not relieved by Tylenol. (Do not take aspirin as this impairs clotting abilities). Take other pain medications only as directed.  You cannot use arms or legs normally.  There are changes in pupil sizes. (This is the black center in the colored part of the eye)  There is clear or bloody discharge from the nose or ears.  Change in speech, vision, swallowing, or understanding.  Localized weakness, numbness, tingling, or change in bowel or bladder control.  

## 2016-10-04 NOTE — ED Notes (Signed)
Pt ambulated in hall to BR w/o assist gait steady denies pain , dizziness , or SOB Approx 50 ft round trip

## 2016-10-08 ENCOUNTER — Telehealth: Payer: Self-pay

## 2016-10-08 ENCOUNTER — Other Ambulatory Visit: Payer: Self-pay | Admitting: Neurology

## 2016-10-08 DIAGNOSIS — G3184 Mild cognitive impairment, so stated: Secondary | ICD-10-CM

## 2016-10-08 MED ORDER — MEMANTINE HCL ER 21 MG PO CP24
21.0000 mg | ORAL_CAPSULE | Freq: Every day | ORAL | 3 refills | Status: DC
Start: 2016-10-08 — End: 2017-01-08

## 2016-10-08 NOTE — Telephone Encounter (Signed)
Received a fax from [facility]. They have namenda XR 14mg  capsule daily on file. However, pt is reporting that there has been an increase in dosage. Per Dr. Guadelupe Sabin pt instructions from 09/06/2016, she does mention increasing the namenda dose to 21mg . ("We will increase the Namenda XR to 21 mg for your next refill. "). It does not appear that this order has been placed.  Will send to Dr. Erlinda Hong, work in physician, to review and place order, if applicable.

## 2016-10-08 NOTE — Telephone Encounter (Signed)
I have prescribed to her pharmacy. Please let the pt know. Thanks.   Rosalin Hawking, MD PhD Stroke Neurology 10/08/2016 6:05 PM

## 2016-10-09 NOTE — Telephone Encounter (Signed)
I spoke to pt's wife, Marcie Bal, per DPR, and advised her that Dr. Rexene Alberts is out of the office, but that Dr. Erlinda Hong reviewed pt's chart and increased the memantine to 21mg  daily and it was sent to pt's [place]. Pt's wife verbalized understanding.

## 2016-10-29 ENCOUNTER — Other Ambulatory Visit: Payer: Self-pay | Admitting: Cardiology

## 2016-11-05 DIAGNOSIS — G513 Clonic hemifacial spasm: Secondary | ICD-10-CM | POA: Diagnosis not present

## 2016-11-08 DIAGNOSIS — L820 Inflamed seborrheic keratosis: Secondary | ICD-10-CM | POA: Diagnosis not present

## 2016-11-09 ENCOUNTER — Ambulatory Visit (INDEPENDENT_AMBULATORY_CARE_PROVIDER_SITE_OTHER): Payer: Medicare Other | Admitting: *Deleted

## 2016-11-09 DIAGNOSIS — I2510 Atherosclerotic heart disease of native coronary artery without angina pectoris: Secondary | ICD-10-CM

## 2016-11-09 DIAGNOSIS — I2583 Coronary atherosclerosis due to lipid rich plaque: Secondary | ICD-10-CM

## 2016-11-09 DIAGNOSIS — I4891 Unspecified atrial fibrillation: Secondary | ICD-10-CM | POA: Diagnosis not present

## 2016-11-09 DIAGNOSIS — Z5181 Encounter for therapeutic drug level monitoring: Secondary | ICD-10-CM | POA: Diagnosis not present

## 2016-11-09 LAB — POCT INR: INR: 3.4

## 2016-11-16 ENCOUNTER — Encounter: Payer: Self-pay | Admitting: Family

## 2016-11-22 DIAGNOSIS — F0280 Dementia in other diseases classified elsewhere without behavioral disturbance: Secondary | ICD-10-CM | POA: Diagnosis not present

## 2016-11-22 DIAGNOSIS — E039 Hypothyroidism, unspecified: Secondary | ICD-10-CM | POA: Diagnosis not present

## 2016-11-22 DIAGNOSIS — I480 Paroxysmal atrial fibrillation: Secondary | ICD-10-CM | POA: Diagnosis not present

## 2016-11-22 DIAGNOSIS — E7800 Pure hypercholesterolemia, unspecified: Secondary | ICD-10-CM | POA: Diagnosis not present

## 2016-11-22 DIAGNOSIS — I714 Abdominal aortic aneurysm, without rupture: Secondary | ICD-10-CM | POA: Diagnosis not present

## 2016-11-22 DIAGNOSIS — G20 Parkinson's disease: Secondary | ICD-10-CM | POA: Diagnosis not present

## 2016-11-22 DIAGNOSIS — E1122 Type 2 diabetes mellitus with diabetic chronic kidney disease: Secondary | ICD-10-CM | POA: Diagnosis not present

## 2016-11-22 DIAGNOSIS — I10 Essential (primary) hypertension: Secondary | ICD-10-CM | POA: Diagnosis not present

## 2016-12-01 ENCOUNTER — Other Ambulatory Visit: Payer: Self-pay | Admitting: Cardiology

## 2016-12-04 ENCOUNTER — Encounter: Payer: Self-pay | Admitting: Family

## 2016-12-04 ENCOUNTER — Ambulatory Visit (INDEPENDENT_AMBULATORY_CARE_PROVIDER_SITE_OTHER): Payer: Medicare Other | Admitting: Family

## 2016-12-04 ENCOUNTER — Other Ambulatory Visit: Payer: Self-pay

## 2016-12-04 ENCOUNTER — Ambulatory Visit (HOSPITAL_COMMUNITY)
Admission: RE | Admit: 2016-12-04 | Discharge: 2016-12-04 | Disposition: A | Discharge status: Home / Self Care | Payer: Medicare Other | Source: Ambulatory Visit | Attending: Family | Admitting: Family

## 2016-12-04 VITALS — BP 114/75 | HR 49 | Temp 97.4°F | Resp 18 | Ht 68.0 in | Wt 200.1 lb

## 2016-12-04 DIAGNOSIS — I714 Abdominal aortic aneurysm, without rupture, unspecified: Secondary | ICD-10-CM

## 2016-12-04 DIAGNOSIS — Z95828 Presence of other vascular implants and grafts: Secondary | ICD-10-CM

## 2016-12-04 NOTE — Progress Notes (Signed)
[facility] [place]  CC: Follow up s/p EVAR  History of Present Illness  Troy [name] is a 76 y.o. (December 16, 1940) male who returns for continued follow-up regarding his abdominal aortic aneurysm stent graft which Dr. Kellie Simmering performed in 2011. In the past he has had a small endoleak but the aneurysm sac has been quite stable and slightly over 5 cm. He denies any new abdominal or back symptoms. He does not ambulate much. He does have edema in both lower extremities he reports. There are no other new symptoms.  [facility] Lendon Ka at [facility] is performing regular injections of left side facial nerve spasms since about 2012; he denies any hx of stroke or TIA, denies a hx of Bell's Palsy.  [facility] provides his Neurology care. Wife states pt has Parkinson's Disease, does work with a Clinical research associate twice/week.  He had a CABG in September, 2010; this was a result of routine cardiac work up in preparation for the CEA and CABG had to be performed first; pt was asymptomatic, denies any hx of MI. AAA was found incidentally.   He takes coumadin for atrial fib, but wife states every EKG recently shows a regular rhythm.  Pt Diabetic: Yes, diet controlled, wife states last A1C was 6.8 Pt smoker: former smoker, quit in 2009, started in his 20's   Past Medical History:  Diagnosis Date  . AAA (abdominal aortic aneurysm) ([place])    with mural thrombus  . Chronic anticoagulation   . CKD (chronic kidney disease) stage 2, GFR 60-89 ml/min   . Constipation   . Coronary artery disease 07/2009   severe 3 vessel ASCAD s/p CABG with LIMA to LAD, SVG to diag, SVG to PL  . Dementia   . Diabetes mellitus   . Diverticulosis   . Facial nerve spasm    L eye, tx'd with botox  . Fecal impaction ([place])   . Gout   . Hepatic steatosis   . Hyperlipidemia   . Hypertension   . Hypothyroidism   . OSA (obstructive sleep apnea)    intolerant to CPAP followed by Dr. Lamonte Sakai  . PAF (paroxysmal  atrial fibrillation) ([place])   . Parkinsonism [facility])    Past Surgical History:  Procedure Laterality Date  . ABDOMINAL AORTIC ANEURYSM REPAIR  01-2010   aorto-bi-iliac Gore Excluder stent graft  . CORONARY ARTERY BYPASS GRAFT  07-2009  . EYE SURGERY Left May  2013   Cataract  . EYE SURGERY Right June 2013   Cataract  . [place]   Social History Social History  Substance Use Topics  . Smoking status: Former Smoker    Years: 15.00    Types: Cigarettes    Quit date: 10/07/2008  . Smokeless tobacco: Never Used  . Alcohol use 1.2 - 1.8 oz/week    2 - 3 Glasses of wine per week     Comment: A week   Family History Family History  Problem Relation Age of Onset  . Heart disease Mother     CHF  . Alzheimer's disease Mother   . Alzheimer's disease Father   . Alzheimer's disease Brother   . Alzheimer's disease Paternal Uncle    Current Outpatient Prescriptions on File Prior to Visit  Medication Sig Dispense Refill  . amLODipine (NORVASC) 10 MG tablet TAKE ONE TABLET BY MOUTH ONCE DAILY 30 tablet 11  . BYSTOLIC 5 MG tablet TAKE ONE TABLET BY MOUTH ONCE DAILY 90 tablet 2  .  carbidopa-levodopa (SINEMET CR) 50-200 MG tablet Take 1 tablet by mouth 4 (four) times daily. 7, 11, 3 pm and 7 pm 360 tablet 3  . carboxymethylcellulose (REFRESH PLUS) 0.5 % SOLN Place 1 drop into both eyes 3 (three) times daily as needed (dry eyes).    . Cholecalciferol (VITAMIN D-3) 1000 UNITS CAPS Take 2,000 Units by mouth daily.     . Cinnamon 500 MG capsule Take 1,000 mg by mouth daily.     . clindamycin (CLEOCIN) 150 MG capsule Only before dental appointments    . clonazePAM (KLONOPIN) 0.25 MG disintegrating tablet Take 3 tablets (0.75 mg total) by mouth at bedtime. 90 tablet 5  . DHA-EPA-Vitamin E [id number] MG-MG-UNIT CAPS Take 1 capsule by mouth 2 (two) times daily as needed (dry eyes).     Marland Kitchen donepezil (ARICEPT) 10 MG tablet Take 1 tablet (10 mg total) by mouth at bedtime. 90 tablet 3  . EPIPEN  2-PAK 0.3 MG/0.3ML SOAJ Inject 0.3 mg into the muscle once as needed. Allergic reaction    . fluticasone (FLONASE) 50 MCG/ACT nasal spray Place 2 sprays into the nose daily as needed for allergies.     Marland Kitchen levothyroxine (SYNTHROID, LEVOTHROID) 125 MCG tablet Take 125 mcg by mouth daily.      Marland Kitchen lisinopril (PRINIVIL,ZESTRIL) 40 MG tablet TAKE ONE TABLET BY MOUTH ONCE DAILY 90 tablet 1  . Memantine HCl ER 21 MG CP24 Take 21 mg by mouth daily. 90 capsule 3  . OVER THE COUNTER MEDICATION Take 1 tablet by mouth 2 (two) times daily. Macular degeneration supplement    . OVER THE COUNTER MEDICATION Take 1 capsule by mouth daily. Homocysteine supplement    . rosuvastatin (CRESTOR) 5 MG tablet Take 1 tablet (5 mg total) by mouth daily. 30 tablet 11  . warfarin (COUMADIN) 5 MG tablet TAKE ONE TABLET BY MOUTH ONCE DAILY AS DIRECTED 40 tablet 3   No current facility-administered medications on file prior to visit.    Allergies  Allergen Reactions  . Bee Venom Anaphylaxis  . Other Anaphylaxis    Other reaction(s): Laryngeal Edema (ALLERGY) "a heart medication" ? unknown  . Tamsulosin Other (See Comments)    Insomnia  . Penicillins Rash  . Tetanus Toxoids Rash     ROS: See HPI for pertinent positives and negatives.  Physical Examination  Vitals:   12/04/16 0831  BP: 114/75  Pulse: (!) 49  Resp: 18  Temp: 97.4 F (36.3 C)  TempSrc: Oral  SpO2: 95%  Weight: 200 lb 1.6 oz (90.8 kg)  Height: 5\' 8"  (1.727 m)   Body mass index is 30.43 kg/m.  General: A&O x 1 (to person only), WD, obese male.  Pulmonary: Sym exp, respirations are non labored, good air movt, CTAB, no rales, rhonchi, or wheezing.  Cardiac: Irregular rhythm, bradycardic, no murmur appreciated  Vascular: Vessel Right Left  Radial 1+Palpable 2+Palpable  Carotid  without bruit  without bruit  Aorta Not palpable N/A  Femoral Not Palpable notPalpable  Popliteal Not palpable Not palpable  PT 1+Palpable 1+Palpable  DP Not  Palpable Not Palpable   Gastrointestinal: soft, NTND, -G/R, - HSM, - palpable masses, - CVAT B.  Musculoskeletal: M/S 5/5 throughout, extremities without ischemic changes.Bilateral pretibial pitting edema: 1+ right, 2+ left.   Neurologic: Pain and light touch intact in extremities, Motor exam as listed above. Left facial droop.    Non-Invasive Vascular Imaging  EVAR Duplex (Date: 12-04-2016)  AAA sac size: 4.2 cm x 5.2 cm; Right  CIA: 1.2 cm; Left CIA: 1.4 cm  no endoleak detected  CTA Abd/Pelvis Duplex (Date: 11-22-15) IMPRESSION: Status post aorto bi-iliac stent calf repair for abdominal aortic aneurysm. Aneurysm sac diameters are stable at 5.5 and 5.1 cm. There is no evidence of endoleak. Previously visualized endoleak has resolved.   Medical Decision Making  JAVEON FUELL is a 76 y.o. male who presents s/p EVAR (Date: 01-2010).  Pt is asymptomatic with decreased sac size (5.2 cm) compared to last CTA on 11-22-15 (5.5.cm). There has been a history of endoleak, but no leak detected today on duplex.  He had a CT of his EVAR at the time he was evaluated by Dr. Kellie Simmering in January 2017.   I discussed with the patient the importance of surveillance of the endograft.  The next endograft duplex will be scheduled for 12 months.  The next CTA will be scheduled as needed.  The patient will follow up with [place] in 12 months with these studies.  I emphasized the importance of maximal medical management including strict control of blood pressure, blood glucose, and lipid levels, antiplatelet agents, obtaining regular exercise, and cessation of smoking.   Thank you for allowing [place] to participate in this patient's care.  Clemon Chambers, RN, MSN, FNP-C [facility] [place] Office: [phone]  Clinic Physician: Early  12/04/2016, 8:36 AM

## 2016-12-06 ENCOUNTER — Ambulatory Visit (INDEPENDENT_AMBULATORY_CARE_PROVIDER_SITE_OTHER): Payer: Medicare Other | Admitting: Cardiology

## 2016-12-06 ENCOUNTER — Ambulatory Visit (INDEPENDENT_AMBULATORY_CARE_PROVIDER_SITE_OTHER): Payer: Medicare Other | Admitting: Pharmacist

## 2016-12-06 ENCOUNTER — Encounter: Payer: Self-pay | Admitting: Cardiology

## 2016-12-06 VITALS — BP 100/60 | HR 60 | Ht 68.0 in | Wt 199.1 lb

## 2016-12-06 DIAGNOSIS — I4891 Unspecified atrial fibrillation: Secondary | ICD-10-CM

## 2016-12-06 DIAGNOSIS — G4733 Obstructive sleep apnea (adult) (pediatric): Secondary | ICD-10-CM | POA: Diagnosis not present

## 2016-12-06 DIAGNOSIS — I481 Persistent atrial fibrillation: Secondary | ICD-10-CM | POA: Diagnosis not present

## 2016-12-06 DIAGNOSIS — I10 Essential (primary) hypertension: Secondary | ICD-10-CM

## 2016-12-06 DIAGNOSIS — Z5181 Encounter for therapeutic drug level monitoring: Secondary | ICD-10-CM

## 2016-12-06 DIAGNOSIS — E7800 Pure hypercholesterolemia, unspecified: Secondary | ICD-10-CM

## 2016-12-06 DIAGNOSIS — I4819 Other persistent atrial fibrillation: Secondary | ICD-10-CM

## 2016-12-06 DIAGNOSIS — I2510 Atherosclerotic heart disease of native coronary artery without angina pectoris: Secondary | ICD-10-CM

## 2016-12-06 LAB — POCT INR: INR: 2.5

## 2016-12-06 MED ORDER — METOPROLOL TARTRATE 25 MG PO TABS: 25.0000 mg | ORAL_TABLET | Freq: Two times a day (BID) | ORAL | 3 refills | Status: DC

## 2016-12-06 NOTE — Patient Instructions (Signed)
Medication Instructions:  1) STOP BYSTOLIC 2) START METOPROLOL 25 mg twice daily  Labwork: TODAY: BMET, LFTs, Lipids  Testing/Procedures: None  Follow-Up: Your physician wants you to follow-up in: 6 months with Dr. Radford Pax. You will receive a reminder letter in the mail two months in advance. If you don't receive a letter, please call our office to schedule the follow-up appointment.   Any Other Special Instructions Will Be Listed Below (If Applicable).     If you need a refill on your cardiac medications before your next appointment, please call your pharmacy.

## 2016-12-06 NOTE — Progress Notes (Signed)
Cardiology Office Note    Date:  12/06/2016   ID:  ROBERTJOHN BOLANOS, DOB 11-12-1941, MRN [id number]  PCP:  Lilian Coma, MD  Cardiologist:  Fransico Him, MD   Chief Complaint  Patient presents with  . Coronary Artery Disease  . Hypertension  . Atrial Fibrillation  . Hyperlipidemia    History of Present Illness:  Troy [name] is a 76 y.o. male  with a history of ASCAD s/p CABG, HTN, PAF, dyslipidemia, OSA now using oral device, dyslipidemia and atrial fibrillation who presents today for followup. He denies any chest pain, SOB, DOE, dizziness, palpitations or syncope. He occasionally has some RLE edema which is stable. He tolerates the oral device for OSA. Occasionally he will snore but according to his wife it is positional.  He feels rested when he gets up but dose continue to have daytime sleepiness with his Parkinson's meds.    Past Medical History:  Diagnosis Date  . AAA (abdominal aortic aneurysm) ([place])    with mural thrombus  . Chronic anticoagulation   . CKD (chronic kidney disease) stage 2, GFR 60-89 ml/min   . Constipation   . Coronary artery disease 07/2009   severe 3 vessel ASCAD s/p CABG with LIMA to LAD, SVG to diag, SVG to PL  . Dementia   . Diabetes mellitus   . Diverticulosis   . Facial nerve spasm    L eye, tx'd with botox  . Fecal impaction ([facility])   . Gout   . Hepatic steatosis   . Hyperlipidemia   . Hypertension   . Hypothyroidism   . OSA (obstructive sleep apnea)    intolerant to CPAP followed by Dr. Lamonte Sakai  . Parkinsonism ([place])   . Persistent atrial fibrillation [facility])     Past Surgical History:  Procedure Laterality Date  . ABDOMINAL AORTIC ANEURYSM REPAIR  01-2010   aorto-bi-iliac Gore Excluder stent graft  . CORONARY ARTERY BYPASS GRAFT  07-2009  . EYE SURGERY Left May  2013   Cataract  . EYE SURGERY Right June 2013   Cataract  . SPINE SURGERY  1998    Current Medications: Outpatient Medications Prior to Visit  Medication Sig  Dispense Refill  . amLODipine (NORVASC) 10 MG tablet TAKE ONE TABLET BY MOUTH ONCE DAILY 30 tablet 11  . BYSTOLIC 5 MG tablet TAKE ONE TABLET BY MOUTH ONCE DAILY 90 tablet 2  . carbidopa-levodopa (SINEMET CR) 50-200 MG tablet Take 1 tablet by mouth 4 (four) times daily. 7, 11, 3 pm and 7 pm 360 tablet 3  . carboxymethylcellulose (REFRESH PLUS) 0.5 % SOLN Place 1 drop into both eyes 3 (three) times daily as needed (dry eyes).    . Cholecalciferol (VITAMIN D-3) 1000 UNITS CAPS Take 2,000 Units by mouth daily.     . Cinnamon 500 MG capsule Take 1,000 mg by mouth daily.     . clindamycin (CLEOCIN) 150 MG capsule Only before dental appointments    . clonazePAM (KLONOPIN) 0.25 MG disintegrating tablet Take 3 tablets (0.75 mg total) by mouth at bedtime. 90 tablet 5  . DHA-EPA-Vitamin E [id number] MG-MG-UNIT CAPS Take 1 capsule by mouth 2 (two) times daily as needed (dry eyes).     Marland Kitchen donepezil (ARICEPT) 10 MG tablet Take 1 tablet (10 mg total) by mouth at bedtime. 90 tablet 3  . EPIPEN 2-PAK 0.3 MG/0.3ML SOAJ Inject 0.3 mg into the muscle once as needed. Allergic reaction    . fluticasone (FLONASE) 50 MCG/ACT nasal spray  Place 2 sprays into the nose daily as needed for allergies.     Marland Kitchen levothyroxine (SYNTHROID, LEVOTHROID) 125 MCG tablet Take 125 mcg by mouth daily.      Marland Kitchen lisinopril (PRINIVIL,ZESTRIL) 40 MG tablet TAKE ONE TABLET BY MOUTH ONCE DAILY 90 tablet 1  . Memantine HCl ER 21 MG CP24 Take 21 mg by mouth daily. 90 capsule 3  . OVER THE COUNTER MEDICATION Take 1 tablet by mouth 2 (two) times daily. Macular degeneration supplement    . OVER THE COUNTER MEDICATION Take 1 capsule by mouth daily. Homocysteine supplement    . rosuvastatin (CRESTOR) 5 MG tablet Take 1 tablet (5 mg total) by mouth daily. 30 tablet 11  . warfarin (COUMADIN) 5 MG tablet TAKE ONE TABLET BY MOUTH ONCE DAILY AS DIRECTED 40 tablet 3   No facility-administered medications prior to visit.      Allergies:   Bee venom; Other;  Tamsulosin; Penicillins; and Tetanus toxoids   Social History   Social History  . Marital status: Married    Spouse name: N/A  . Number of children: 3  . Years of education: BS   Occupational History  . retired    Social History Main Topics  . Smoking status: Former Smoker    Years: 15.00    Types: Cigarettes    Quit date: 10/07/2008  . Smokeless tobacco: Never Used  . Alcohol use 1.2 - 1.8 oz/week    2 - 3 Glasses of wine per week     Comment: A week  . Drug use: No  . Sexual activity: No   Other Topics Concern  . None   Social History Narrative   Patient lives at home with his wife   Patient is right handed   Patient drinks coffee     Family History:  The patient's family history includes Alzheimer's disease in his brother, father, mother, and paternal uncle; Heart disease in his mother.   ROS:   Please see the history of present illness.    ROS All other systems reviewed and are negative.  No flowsheet data found.     PHYSICAL EXAM:   VS:  BP 100/60 (BP Location: Right Arm, Patient Position: Sitting, Cuff Size: Normal)   Pulse 60   Ht 5\' 8"  (1.727 m)   Wt 199 lb 1.9 oz (90.3 kg)   BMI 30.28 kg/m    GEN: Well nourished, well developed, in no acute distress  HEENT: normal  Neck: no JVD, carotid bruits, or masses Cardiac: RRR; no murmurs, rubs, or gallops.  Trace edema.  Intact distal pulses bilaterally.  Respiratory:  clear to auscultation bilaterally, normal work of breathing GI: soft, nontender, nondistended, + BS MS: no deformity or atrophy  Skin: warm and dry, no rash Neuro:  Alert and Oriented x 3, Strength and sensation are intact Psych: euthymic mood, full affect  Wt Readings from Last 3 Encounters:  12/06/16 199 lb 1.9 oz (90.3 kg)  12/04/16 200 lb 1.6 oz (90.8 kg)  10/04/16 194 lb (88 kg)      Studies/Labs Reviewed:   EKG:  EKG is not ordered today.   Recent Labs: 06/01/2016: ALT 7; BUN 11; Creat 0.81; Potassium 3.9; Sodium 142    Lipid Panel    Component Value Date/Time   CHOL 110 (L) 06/01/2016 0743   TRIG 106 06/01/2016 0743   HDL 41 06/01/2016 0743   CHOLHDL 2.7 06/01/2016 0743   VLDL 21 06/01/2016 0743   LDLCALC 48 06/01/2016  0743    Additional studies/ records that were reviewed today include:  none    ASSESSMENT:    1. Coronary artery disease involving native coronary artery of native heart without angina pectoris   2. Essential hypertension   3. Persistent atrial fibrillation ([place])   4. OSA (obstructive sleep apnea)   5. Pure hypercholesterolemia      PLAN:  In order of problems listed above:  1. ASCAD s/p remote CABG - he has not had any anginal symptoms.  He will continue on BB and statin.  He is not on ASA due to warfarin.   2. HTN - BP controlled on current meds. He will continue on ACE I/BB and amlodipine. 3. Persistent atrial fibrillation - he is maintaining NSR on exam.  He will continue BB and warfarin. INRs are followed in our office. He got a letter in the mail stating that his insurance will no longer pay for Bystolic so will change to metoprolol 25mg  BID.  OSA -He is tolerating the oral device with minimal snoring.  This is followed by Dr. Toy Cookey.  Hyperlipidemia - LDL goal is < 70.  Continue statin. Check FLP and ALT.       Medication Adjustments/Labs and Tests Ordered: Current medicines are reviewed at length with the patient today.  Concerns regarding medicines are outlined above.  Medication changes, Labs and Tests ordered today are listed in the Patient Instructions below.  There are no Patient Instructions on file for this visit.   Signed, Fransico Him, MD  12/06/2016 9:41 AM    [facility] [address], [place], [place]  [address] Phone: [phone]; Fax: [phone]

## 2016-12-07 LAB — HEPATIC FUNCTION PANEL
ALK PHOS: 54 IU/L (ref 39–117)
ALT: 11 IU/L (ref 0–44)
AST: 18 IU/L (ref 0–40)
Albumin: 4.3 g/dL (ref 3.5–4.8)
Bilirubin Total: 0.5 mg/dL (ref 0.0–1.2)
Bilirubin, Direct: 0.17 mg/dL (ref 0.00–0.40)
TOTAL PROTEIN: 6.5 g/dL (ref 6.0–8.5)

## 2016-12-07 LAB — BASIC METABOLIC PANEL
BUN / CREAT RATIO: 14 (ref 10–24)
BUN: 13 mg/dL (ref 8–27)
CO2: 27 mmol/L (ref 18–29)
Calcium: 9.7 mg/dL (ref 8.6–10.2)
Chloride: 102 mmol/L (ref 96–106)
Creatinine, Ser: 0.96 mg/dL (ref 0.76–1.27)
GFR calc Af Amer: 88 mL/min/{1.73_m2} (ref >59)
GFR, EST NON AFRICAN AMERICAN: 76 mL/min/{1.73_m2} (ref >59)
GLUCOSE: 109 mg/dL — AB (ref 65–99)
POTASSIUM: 4.1 mmol/L (ref 3.5–5.2)
SODIUM: 144 mmol/L (ref 134–144)

## 2016-12-07 LAB — LIPID PANEL
CHOLESTEROL TOTAL: 112 mg/dL (ref 100–199)
Chol/HDL Ratio: 3 ratio units (ref 0.0–5.0)
HDL: 37 mg/dL — AB (ref >39)
LDL Calculated: 51 mg/dL (ref 0–99)
TRIGLYCERIDES: 120 mg/dL (ref 0–149)
VLDL Cholesterol Cal: 24 mg/dL (ref 5–40)

## 2016-12-29 ENCOUNTER — Other Ambulatory Visit: Payer: Self-pay | Admitting: Cardiology

## 2017-01-03 ENCOUNTER — Ambulatory Visit (INDEPENDENT_AMBULATORY_CARE_PROVIDER_SITE_OTHER): Payer: Medicare Other | Admitting: *Deleted

## 2017-01-03 DIAGNOSIS — I4819 Other persistent atrial fibrillation: Secondary | ICD-10-CM

## 2017-01-03 DIAGNOSIS — Z5181 Encounter for therapeutic drug level monitoring: Secondary | ICD-10-CM

## 2017-01-03 DIAGNOSIS — I4891 Unspecified atrial fibrillation: Secondary | ICD-10-CM | POA: Diagnosis not present

## 2017-01-03 DIAGNOSIS — I481 Persistent atrial fibrillation: Secondary | ICD-10-CM | POA: Diagnosis not present

## 2017-01-03 DIAGNOSIS — I2510 Atherosclerotic heart disease of native coronary artery without angina pectoris: Secondary | ICD-10-CM

## 2017-01-03 LAB — POCT INR: INR: 2.6

## 2017-01-05 DIAGNOSIS — J0100 Acute maxillary sinusitis, unspecified: Secondary | ICD-10-CM | POA: Diagnosis not present

## 2017-01-08 ENCOUNTER — Encounter: Payer: Self-pay | Admitting: Neurology

## 2017-01-08 ENCOUNTER — Ambulatory Visit (INDEPENDENT_AMBULATORY_CARE_PROVIDER_SITE_OTHER): Payer: Medicare Other | Admitting: Neurology

## 2017-01-08 VITALS — BP 120/74 | HR 48 | Resp 20 | Ht 67.0 in | Wt 197.0 lb

## 2017-01-08 DIAGNOSIS — F0280 Dementia in other diseases classified elsewhere without behavioral disturbance: Secondary | ICD-10-CM | POA: Diagnosis not present

## 2017-01-08 DIAGNOSIS — M7989 Other specified soft tissue disorders: Secondary | ICD-10-CM

## 2017-01-08 DIAGNOSIS — I2510 Atherosclerotic heart disease of native coronary artery without angina pectoris: Secondary | ICD-10-CM

## 2017-01-08 DIAGNOSIS — G4752 REM sleep behavior disorder: Secondary | ICD-10-CM | POA: Diagnosis not present

## 2017-01-08 DIAGNOSIS — G20 Parkinson's disease: Secondary | ICD-10-CM

## 2017-01-08 DIAGNOSIS — G20C Parkinsonism, unspecified: Secondary | ICD-10-CM

## 2017-01-08 MED ORDER — CLONAZEPAM 0.25 MG PO TBDP: 0.5000 mg | ORAL_TABLET | Freq: Every day | ORAL | 5 refills | Status: DC

## 2017-01-08 MED ORDER — MEMANTINE HCL 10 MG PO TABS: 10.0000 mg | ORAL_TABLET | Freq: Two times a day (BID) | ORAL | 5 refills | Status: DC

## 2017-01-08 NOTE — Progress Notes (Signed)
Subjective:    Patient ID: Troy [name] is a 76 y.o. male.  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 The patient is accompanied by his wife again today. I last saw him on 09/06/2016, at which time he reported doing okay, he had some daytime somnolence which was about the same, he came off of Comtan, he was on Sinemet CR 4 times a day but was complaining of sleepiness from it. He did not sleep very well at night. He was using a dental appliance for sleep apnea minute was bothering him. He was on clonazepam 0.5 mg each night. He was following with cardiology and also had an appointment with his dentist first dental appliance. I suggested we increase his clonazepam to 0.75 mg each night, he was advised to take Sinemet CR one pill 4 times a day at 7, 11, 3 PM and 7 PM. I suggested we increase his long-acting Namenda to 21 mg daily.   Today, 01/08/2017 (all dictated new, as well as above notes, some dictation done in note pad or Word, outside of chart, may appear as copied):   He reports no significant issues, is on antibiotics for sinus infection, his wife had similar illness. She believes that he had an sleeping better since the Klonopin was increased to 0.75 mg last time but he has had a couple falls. His fall that led him to go to the emergency room due to injury to the right arm happened in the middle of the night. Usually he does not have much in the way  of difficulty getting up and going to the bathroom in the middle of the night. Sinemet CR 1 pill 4 times a day seems to be working well and he takes it about every 4 hours. He has been on long-acting Namenda 21 mg daily, he just refilled it, it is costly, she is wondering if we can change this to generic. He is stable on Aricept 10 mg generic once daily. He was changed to metoprolol recently. Of note, he went to the emergency room on 10/04/2014, secondary to fall. I reviewed the emergency room records. He sustained abrasion to the right arm which was bandaged, healed well per wife.    The patient's allergies, current medications, family history, past medical history, past social history, past surgical history and problem list were reviewed and updated as appropriate.   Previously (copied from previous notes for reference):   I saw him on 05/03/2016, at which time he reported doing fairly well. His wife reported that he was tolerating Namenda. He was quite sleepy during the day. He was not able to tolerate Sinemet immediate release and we went back to Sinemet CR 3 times a day along with Comtan. Comtan generic was very expensive. Memory was stable per the report. He was complaining of erectile dysfunction. MMSE was 17 out of 30, clock drawing was 1 out of 4 and animal fluency was 3/m, fairly stable findings. I suggested we increase Sinemet CR to 1 pill 4 times a day and stop the Comtan.  He was encouraged to increase Namenda long-acting to 14 mg once daily. I continued his clonazepam.    I saw him on 12/29/2015, at which time he reported feeling okay. His wife reported more memory issues, including forgetfulness and at times confusion. He has been on Aricept generic for at least 3 years. He has been taking Sinemet CR 3 times a day along with Comtan generic. Was active physically and would work out with a trainer twice a week but not so much in between. I suggested he continue with his Parkinson's medication  but asked him to start on Namenda long-acting 7 mg once daily for memory loss. His MMSE was 18 out of 30 at the time.   I saw him on 08/23/2015, at which time he reported deterioration of his speech. He was in outpatient PT, OT and ST. She felt that his memory was worse. He did not do well with carbidopa-levodopa 4 times a day and his wife called on 03/31/2015 to change it back to CR. He was back on Sinemet CR 50-200 milligrams one tablet 3 times a day. He was exercising at the gym twice a day. He had finished LSVT-BIG at neuro rehab. He was on Aricept and had been on it for about 3 years. He was sleeping fairly well. Unfortunately, his wife had some surgical complications and medical setbacks. I asked him to continue with Aricept generic 10 mg once daily, Sinemet CR one tablet 3 times a day, and Comtan 3 times a day. He was also on clonazepam 0.5 mg at bedtime. I asked him to monitor his leg swelling and start using compression stockings to his legs.   I saw him on 03/07/2015, at which time he asked whether he could drive. His wife was supposed to have knee replacement surgery in July 2016 and he needed a way to get to his therapy appointments. He had benefit from outpatient occupational, physical and speech therapy in the past. He was working with a Physiological scientist 2 times a week as well. He had fallen in the past repeatedly. He felt his memory was about the same. He had some more daytime somnolence. He felt that this happened especially after taking Sinemet. He was taking Sinemet long-acting 3 times a day, 50-200 milligrams strength. He was taking Comtan 3 times a day. He had not changed his medication regimen well. He had no recent falls. His left hemifacial spasm had remained the same. He was sleeping a little better after starting clonazepam. I suggested we switch him to immediate release Sinemet, 25-100 milligrams strength, 1 pill 4 times a day and I suggested we continue Comtan 200 mg 3 times a day. I  suggested he start using lower extremity compression stockings to help with the pressure maintenance and lower extremity swelling reduction. I asked him no longer to drive.   I first met him on 08/23/2014 at the request of Dr. Erlinda Hong, at which time the patient reported 5 year history of parkinsonism. I kept him on the current medication regimen. 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    He was diagnosed with parkinsonism about 5 years ago. He was placed on Sinemet and continues to take Sinemet ER 50-200 mg strength 3 times a day. Comtan was added recently about 4 months ago. 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 Maudry Mayhew for hemifacial  spasm. Previous workup included MRI brain last year showing brain atrophy and small vessel white matter changes. 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 He also had LP done in January 2015 which did not show evidence of NPH, and the CSF was clean.   He noted more difficulty with his gait after stopping the comtan, so he re-started it. His sleep is better. He has had some memory loss. He has been on Aricept for about 2 years and memory has been stable. He has no VH, or AH. He has no mood issues.     His Past Medical History Is Significant For: Past Medical History:  Diagnosis Date  . AAA (abdominal aortic aneurysm) ([place])    with mural thrombus  . Chronic anticoagulation   . CKD (chronic kidney disease) stage 2, GFR 60-89 ml/min   . Constipation   . Coronary artery disease 07/2009   severe 3 vessel ASCAD s/p CABG with LIMA to LAD, SVG to diag, SVG to PL  . Dementia   . Diabetes mellitus   . Diverticulosis   . Facial nerve spasm    L eye, tx'd with botox  . Fecal impaction ([place])   . Gout   . Hepatic steatosis   . Hyperlipidemia   . Hypertension    . Hypothyroidism   . OSA (obstructive sleep apnea)    intolerant to CPAP followed by Dr. Lamonte Sakai  . Parkinsonism ([place])   . Persistent atrial fibrillation ([place])     His Past Surgical History Is Significant For: Past Surgical History:  Procedure Laterality Date  . ABDOMINAL AORTIC ANEURYSM REPAIR  01-2010   aorto-bi-iliac Gore Excluder stent graft  . CORONARY ARTERY BYPASS GRAFT  07-2009  . EYE SURGERY Left May  2013   Cataract  . EYE SURGERY Right June 2013   Cataract  . [place]    His Family History Is Significant For: Family History  Problem Relation Age of Onset  . Heart disease Mother     CHF  . Alzheimer's disease Mother   . Alzheimer's disease Father   . Alzheimer's disease Brother   . Alzheimer's disease Paternal Uncle     His Social History Is Significant For: Social History   Social History  . Marital status: Married    Spouse name: N/A  . Number of children: 3  . Years of education: BS   Occupational History  . retired    Social History Main Topics  . Smoking status: Former Smoker    Years: 15.00    Types: Cigarettes    Quit date: 10/07/2008  . Smokeless tobacco: Never Used  . Alcohol use 1.2 - 1.8 oz/week    2 - 3 Glasses of wine per week     Comment: A week  . Drug use: No  . Sexual activity: No   Other Topics Concern  . None   Social History Narrative   Patient lives at home with his wife   Patient is right handed   Patient drinks coffee    His Allergies Are:  Allergies  Allergen Reactions  . Bee Venom Anaphylaxis  . Other Anaphylaxis    Other reaction(s): Laryngeal Edema (ALLERGY) "a heart medication" ? unknown  . Tamsulosin Other (See Comments)    Insomnia  . Penicillins Rash  . Tetanus Toxoids Rash  :   His Current Medications Are:  Outpatient Encounter Prescriptions as of 01/08/2017  Medication Sig  . amLODipine (  NORVASC) 10 MG tablet TAKE ONE TABLET BY MOUTH ONCE DAILY  . carbidopa-levodopa (SINEMET CR) 50-200  MG tablet Take 1 tablet by mouth 4 (four) times daily. 7, 11, 3 pm and 7 pm  . carboxymethylcellulose (REFRESH PLUS) 0.5 % SOLN Place 1 drop into both eyes 3 (three) times daily as needed (dry eyes).  . Cholecalciferol (VITAMIN D-3) 1000 UNITS CAPS Take 2,000 Units by mouth daily.   . Cinnamon 500 MG capsule Take 1,000 mg by mouth daily.   . clindamycin (CLEOCIN) 150 MG capsule Only before dental appointments  . clonazePAM (KLONOPIN) 0.25 MG disintegrating tablet Take 3 tablets (0.75 mg total) by mouth at bedtime.  . DHA-EPA-Vitamin E 200-300-5 MG-MG-UNIT CAPS Take 1 capsule by mouth 2 (two) times daily as needed (dry eyes).   Marland Kitchen donepezil (ARICEPT) 10 MG tablet Take 1 tablet (10 mg total) by mouth at bedtime.  Marland Kitchen doxycycline (VIBRAMYCIN) 100 MG capsule Take 100 mg by mouth 2 (two) times daily.  Marland Kitchen EPIPEN 2-PAK 0.3 MG/0.3ML SOAJ Inject 0.3 mg into the muscle once as needed. Allergic reaction  . fluticasone (FLONASE) 50 MCG/ACT nasal spray Place 2 sprays into the nose daily as needed for allergies.   Marland Kitchen levothyroxine (SYNTHROID, LEVOTHROID) 125 MCG tablet Take 125 mcg by mouth daily.    Marland Kitchen lisinopril (PRINIVIL,ZESTRIL) 40 MG tablet TAKE ONE TABLET BY MOUTH ONCE DAILY  . Memantine HCl ER 21 MG CP24 Take 21 mg by mouth daily.  . metoprolol tartrate (LOPRESSOR) 25 MG tablet Take 1 tablet (25 mg total) by mouth 2 (two) times daily.  Marland Kitchen OVER THE COUNTER MEDICATION Take 1 tablet by mouth 2 (two) times daily. Macular degeneration supplement  . OVER THE COUNTER MEDICATION Take 1 capsule by mouth daily. Homocysteine supplement  . rosuvastatin (CRESTOR) 5 MG tablet Take 1 tablet (5 mg total) by mouth daily.  Marland Kitchen warfarin (COUMADIN) 5 MG tablet TAKE ONE TABLET BY MOUTH ONCE DAILY AS DIRECTED   No facility-administered encounter medications on file as of 01/08/2017.   :  Review of Systems:  Out of a complete 14 point review of systems, all are reviewed and negative with the exception of these symptoms as listed  below: Review of Systems  Neurological:       Pt presents today to discuss his memory. Pt says that his memory is a little worse. Pt says that his taking brand name namenda and it is very expensive.    Objective:  Neurologic Exam  Physical Exam Physical Examination:   Vitals:   01/08/17 1408  BP: 120/74  Pulse: (!) 48  Resp: 20   General Examination: The patient is a very pleasant 76 y.o. male in no acute distress. He appears well-developed and well-nourished and well groomed. More quiet today.  HEENT: Normocephalic, atraumatic, pupils are equal, round and reactive to light and accommodation. Extraocular tracking shows mild saccadic breakdown without nystagmus noted. He is s/p cataract repairs. There is limitation to upper gaze. There is a very mild decrease in eye blink rate. L eyelid is droopy, slightly better, s/p eyelid surgery. Hearing is impaired mildly. 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 There is no lip, neck or jaw tremor. Neck is minimally rigid with intact passive ROM. There are no carotid bruits on auscultation. Oropharynx exam reveals moderate mouth dryness, okay marginal dental hygiene. No significant airway crowding is noted. Mallampati is class II. Tongue protrudes  centrally and palate elevates symmetrically. There is mild drooling.   Chest: is clear to auscultation without wheezing, rhonchi or crackles noted.  Heart: sounds are regular and normal without murmurs, rubs or gallops noted.   Abdomen: is soft, non-tender and non-distended with normal bowel sounds appreciated on auscultation.  Extremities: There is 1+ pitting edema in the distal lower extremities bilaterally, right worse than left. Pedal pulses are intact.   Skin: is warm and dry with no trophic changes noted. Age-related changes are noted on the skin.   Musculoskeletal: exam reveals no obvious  joint deformities, tenderness, joint swelling or erythema.  Neurologically:  Mental status: The patient is awake and alert, paying fair attention. He is able to provide very little of the history. His wife provides most of the history. His memory, attention, language and knowledge are impaired. There is no aphasia, agnosia, apraxia or anomia. There is a mild degree of bradyphrenia. Speech is mildly hypophonic with mild dysarthria noted. Mood is congruent and affect is normal.   On 12/29/2015: MMSE: 18/30, CDT: 1/4, AFT: 4/min.  On 05/03/2016: MMSE: 17/30, CDT: 1/4, AFT: 3/min.  On 01/08/2017: MMSE: 13/30, CDT: 1/4, AFT: 3/min.   Cranial nerves are as described above under HEENT exam. In addition, shoulder shrug is normal with equal shoulder height noted.  Motor exam: Normal bulk, and global strength of 4+/5. No dyskinesias noted. Tone is globally mildly rigid, no significant cogwheeling is noted, overall mild bradykinesia, stable findings, no tremor noted today, that includes resting, postural or action tremor. Romberg is not testable safely as he has a tendency to stand wide-based and I did not want to risk it. Reflexes are 1+ in the upper extremities and 2+ in the lower extremities. Findings motor skills are globally mild to moderately impaired, no significant lateralization, perhaps slightly worse on the left but no telltale differences.  Cerebellar testing shows no dysmetria or intention tremor.   Sensory exam is intact to light touch in the upper and lower extremities.   Gait, station and balance: He stands up from the seated position with mild difficulty, he needs no assistance but does push himself up. He has mild to moderately stooped posture, no significant lean to one side, he walks slowly, decreased arm swing bilaterally. Mild difficulty turning, tandem walk is not possible, balance is impaired but mildly and appears to be stable. Assessment and Plan:    In summary, Troy [name]  is a very pleasant 76 y.o.-year old male with anUnderlying complex medical history of diabetes, hypertension, heart disease, AAA, history of tremors, hepatic steatosis, coronary artery disease, status post open heart surgery with CABG, history of proximal A. fib, on chronic anticoagulation with Coumadin, history of left hemifacial spasm with treatment of Botox, chronic kidney disease, OSA with intolerance to CPAP in the past and treatment with oral appliance, diverticulosis, gout, hyperlipidemia, who presents for follow-up consultation of his dementia and parkinsonism. He was on Comtan and long-acting Sinemet but could not tolerate the immediate release Sinemet. We have been able to take him off the Comtan as of June 2017 without significant repercussions. He has been stable on Sinemet long-acting 1 pill 4 times a day. He was on long-acting Namenda 40 mg once daily we increase this in October 2017 to 21 mg once daily. He has a history of REM behavior disorder and we increased his clonazepam in October 2017 from 0.5 mg to 0.75 mg each night and while he slept a little better and had less dream  enactments, he has had 2 falls since his last visit, one fall that led to an emergency room visit and soft tissue injury to his right arm. Thankfully, he has recovered well from this and had no head injury. I suggested we reduce his clonazepam back to 0.5 mg each night for fear of exacerbation of nighttime grogginess and balance issues. Today, we discussed the memory loss quite a bit. His memory scores have declined with time, today a little less than last time but also in the context of still being treated for sinus infection with antibiotic medication. Nevertheless, I would like to have increased his long-acting Namenda to 28 mg today but due to cost we will switch him to generic Namenda 10 mg twice daily. We will continue with Aricept generic once daily, we will continue with Sinemet CR one pill 4 times a day and reduce his  clonazepam. I suggested a 6 month follow-up, sooner as needed. I answered all her questions today and the patient and his wife were in agreement. I spent 25 minutes in total face-to-face time with the patient, more than 50% of which was spent in counseling and coordination of care, reviewing test results, reviewing medication and discussing or reviewing the diagnosis of PD, dementia, the prognosis and treatment options. Pertinent laboratory and imaging test results that were available during this visit with the patient were reviewed by me and considered in my medical decision making (see chart for details).

## 2017-01-08 NOTE — Patient Instructions (Signed)
We will continue with the Sinemet CR 1 pill 4 times a day.  We will reduce the Klonopin to 0.5 mg each night due to fall risk.  We will continue with generic Aricept 10 mg once daily.  We will change the Namenda XR 21 mg once daily to generic namenda 10 mg 2 times a day.

## 2017-01-10 ENCOUNTER — Telehealth: Payer: Self-pay | Admitting: *Deleted

## 2017-01-10 ENCOUNTER — Ambulatory Visit (INDEPENDENT_AMBULATORY_CARE_PROVIDER_SITE_OTHER): Payer: Medicare Other

## 2017-01-10 DIAGNOSIS — I2510 Atherosclerotic heart disease of native coronary artery without angina pectoris: Secondary | ICD-10-CM

## 2017-01-10 DIAGNOSIS — I4891 Unspecified atrial fibrillation: Secondary | ICD-10-CM | POA: Diagnosis not present

## 2017-01-10 DIAGNOSIS — I481 Persistent atrial fibrillation: Secondary | ICD-10-CM | POA: Diagnosis not present

## 2017-01-10 DIAGNOSIS — Z5181 Encounter for therapeutic drug level monitoring: Secondary | ICD-10-CM

## 2017-01-10 DIAGNOSIS — I4819 Other persistent atrial fibrillation: Secondary | ICD-10-CM

## 2017-01-10 LAB — POCT INR: INR: 2.2

## 2017-01-10 NOTE — Telephone Encounter (Signed)
Pt's wife called & stated that the pt started an antibiotic on 01/05/17 for a sinus infection. Per wife the pt is taking Doxycycline 100mg  bid and started Saturday. Advised that the med interferes with Coumadin and increases the INR therefore the pt needs to be seen today. Appt set by wife over the phone.

## 2017-01-17 ENCOUNTER — Telehealth: Payer: Self-pay | Admitting: Neurology

## 2017-01-17 NOTE — Telephone Encounter (Signed)
Patients wife called office in reference to patient not being able to walk starting 9:15pm last night without a walker.  Per wife he is having leg freezing, also patient had to have total assistant to get ready for bed last night, but didn't sleep at all.  This morning patient still unable to walk without assistant of the walker.  Please call

## 2017-01-17 NOTE — Telephone Encounter (Signed)
I called, spoke to pt's wife, per DPR. She reports a "dramatic change" in pt. Yesterday, he completed his work out with the PD trainer as planned. He seemed more fatigued during the day and had trouble dressing himself for a church activity yesterday afternoon. Right after they got home from church, he promptly fell asleep, but when he awoke, he had difficulty walking. He needed to use a walker and noted leg freezing, which is new to him. Pt did not sleep well last night. This morning, he is still experiencing leg freezing and is unable to walk without the aid of a walker. Pt usually does not use a walker, and the only reason they had a walker in the house was because pt's wife had a knee replacement. Pt's wife says that the pt has not missed any doses of sinemet. Pt's wife is concerned, and is wondering what Dr. Guadelupe Sabin thoughts on this are.

## 2017-01-18 ENCOUNTER — Ambulatory Visit
Admission: RE | Admit: 2017-01-18 | Discharge: 2017-01-18 | Disposition: A | Discharge status: Home / Self Care | Payer: Medicare Other | Source: Ambulatory Visit | Attending: Family Medicine | Admitting: Family Medicine

## 2017-01-18 ENCOUNTER — Other Ambulatory Visit: Payer: Self-pay | Admitting: Family Medicine

## 2017-01-18 DIAGNOSIS — G20 Parkinson's disease: Secondary | ICD-10-CM | POA: Diagnosis not present

## 2017-01-18 DIAGNOSIS — J209 Acute bronchitis, unspecified: Secondary | ICD-10-CM

## 2017-01-18 DIAGNOSIS — R05 Cough: Secondary | ICD-10-CM | POA: Diagnosis not present

## 2017-01-18 DIAGNOSIS — I480 Paroxysmal atrial fibrillation: Secondary | ICD-10-CM | POA: Diagnosis not present

## 2017-01-19 ENCOUNTER — Emergency Department (HOSPITAL_COMMUNITY): Payer: Medicare Other

## 2017-01-19 ENCOUNTER — Inpatient Hospital Stay (HOSPITAL_COMMUNITY)
Admission: EM | Admit: 2017-01-19 | Discharge: 2017-01-21 | DRG: 194 | Disposition: A | Discharge status: Home / Self Care | Payer: Medicare Other | Attending: Family Medicine | Admitting: Family Medicine

## 2017-01-19 ENCOUNTER — Encounter (HOSPITAL_COMMUNITY): Payer: Self-pay | Admitting: Emergency Medicine

## 2017-01-19 DIAGNOSIS — Z87891 Personal history of nicotine dependence: Secondary | ICD-10-CM

## 2017-01-19 DIAGNOSIS — Z91030 Bee allergy status: Secondary | ICD-10-CM

## 2017-01-19 DIAGNOSIS — R930 Abnormal findings on diagnostic imaging of skull and head, not elsewhere classified: Secondary | ICD-10-CM | POA: Diagnosis not present

## 2017-01-19 DIAGNOSIS — J101 Influenza due to other identified influenza virus with other respiratory manifestations: Principal | ICD-10-CM

## 2017-01-19 DIAGNOSIS — I481 Persistent atrial fibrillation: Secondary | ICD-10-CM | POA: Diagnosis present

## 2017-01-19 DIAGNOSIS — Z888 Allergy status to other drugs, medicaments and biological substances status: Secondary | ICD-10-CM

## 2017-01-19 DIAGNOSIS — I480 Paroxysmal atrial fibrillation: Secondary | ICD-10-CM | POA: Diagnosis present

## 2017-01-19 DIAGNOSIS — J0101 Acute recurrent maxillary sinusitis: Secondary | ICD-10-CM | POA: Diagnosis not present

## 2017-01-19 DIAGNOSIS — E039 Hypothyroidism, unspecified: Secondary | ICD-10-CM | POA: Diagnosis present

## 2017-01-19 DIAGNOSIS — R531 Weakness: Secondary | ICD-10-CM | POA: Diagnosis not present

## 2017-01-19 DIAGNOSIS — I2510 Atherosclerotic heart disease of native coronary artery without angina pectoris: Secondary | ICD-10-CM | POA: Diagnosis present

## 2017-01-19 DIAGNOSIS — Z820 Family history of epilepsy and other diseases of the nervous system: Secondary | ICD-10-CM

## 2017-01-19 DIAGNOSIS — Z79899 Other long term (current) drug therapy: Secondary | ICD-10-CM

## 2017-01-19 DIAGNOSIS — E876 Hypokalemia: Secondary | ICD-10-CM | POA: Diagnosis present

## 2017-01-19 DIAGNOSIS — R4182 Altered mental status, unspecified: Secondary | ICD-10-CM | POA: Diagnosis present

## 2017-01-19 DIAGNOSIS — Z951 Presence of aortocoronary bypass graft: Secondary | ICD-10-CM

## 2017-01-19 DIAGNOSIS — R0602 Shortness of breath: Secondary | ICD-10-CM | POA: Diagnosis not present

## 2017-01-19 DIAGNOSIS — N182 Chronic kidney disease, stage 2 (mild): Secondary | ICD-10-CM | POA: Diagnosis present

## 2017-01-19 DIAGNOSIS — Z8249 Family history of ischemic heart disease and other diseases of the circulatory system: Secondary | ICD-10-CM

## 2017-01-19 DIAGNOSIS — E1122 Type 2 diabetes mellitus with diabetic chronic kidney disease: Secondary | ICD-10-CM | POA: Diagnosis present

## 2017-01-19 DIAGNOSIS — Z9842 Cataract extraction status, left eye: Secondary | ICD-10-CM

## 2017-01-19 DIAGNOSIS — I4819 Other persistent atrial fibrillation: Secondary | ICD-10-CM | POA: Diagnosis present

## 2017-01-19 DIAGNOSIS — G20 Parkinson's disease: Secondary | ICD-10-CM | POA: Diagnosis present

## 2017-01-19 DIAGNOSIS — Z7901 Long term (current) use of anticoagulants: Secondary | ICD-10-CM

## 2017-01-19 DIAGNOSIS — K760 Fatty (change of) liver, not elsewhere classified: Secondary | ICD-10-CM | POA: Diagnosis present

## 2017-01-19 DIAGNOSIS — I517 Cardiomegaly: Secondary | ICD-10-CM | POA: Diagnosis not present

## 2017-01-19 DIAGNOSIS — R410 Disorientation, unspecified: Secondary | ICD-10-CM | POA: Diagnosis not present

## 2017-01-19 DIAGNOSIS — I10 Essential (primary) hypertension: Secondary | ICD-10-CM | POA: Diagnosis present

## 2017-01-19 DIAGNOSIS — G4733 Obstructive sleep apnea (adult) (pediatric): Secondary | ICD-10-CM | POA: Diagnosis present

## 2017-01-19 DIAGNOSIS — F0390 Unspecified dementia without behavioral disturbance: Secondary | ICD-10-CM | POA: Diagnosis present

## 2017-01-19 DIAGNOSIS — I129 Hypertensive chronic kidney disease with stage 1 through stage 4 chronic kidney disease, or unspecified chronic kidney disease: Secondary | ICD-10-CM | POA: Diagnosis present

## 2017-01-19 DIAGNOSIS — Z880 Allergy status to penicillin: Secondary | ICD-10-CM

## 2017-01-19 DIAGNOSIS — Z9841 Cataract extraction status, right eye: Secondary | ICD-10-CM

## 2017-01-19 DIAGNOSIS — E785 Hyperlipidemia, unspecified: Secondary | ICD-10-CM | POA: Diagnosis present

## 2017-01-19 DIAGNOSIS — R269 Unspecified abnormalities of gait and mobility: Secondary | ICD-10-CM | POA: Diagnosis present

## 2017-01-19 DIAGNOSIS — G20C Parkinsonism, unspecified: Secondary | ICD-10-CM | POA: Diagnosis present

## 2017-01-19 LAB — URINALYSIS, ROUTINE W REFLEX MICROSCOPIC
BILIRUBIN URINE: NEGATIVE
Bacteria, UA: NONE SEEN
GLUCOSE, UA: NEGATIVE mg/dL
Hgb urine dipstick: NEGATIVE
KETONES UR: 5 mg/dL — AB
LEUKOCYTES UA: NEGATIVE
NITRITE: NEGATIVE
PH: 5 (ref 5.0–8.0)
PROTEIN: 100 mg/dL — AB
SQUAMOUS EPITHELIAL / LPF: NONE SEEN
Specific Gravity, Urine: 1.026 (ref 1.005–1.030)

## 2017-01-19 LAB — CBC
HCT: 42.1 % (ref 39.0–52.0)
Hemoglobin: 14.2 g/dL (ref 13.0–17.0)
MCH: 30 pg (ref 26.0–34.0)
MCHC: 33.7 g/dL (ref 30.0–36.0)
MCV: 89 fL (ref 78.0–100.0)
PLATELETS: 121 10*3/uL — AB (ref 150–400)
RBC: 4.73 MIL/uL (ref 4.22–5.81)
RDW: 15.1 % (ref 11.5–15.5)
WBC: 5.3 10*3/uL (ref 4.0–10.5)

## 2017-01-19 LAB — BASIC METABOLIC PANEL
Anion gap: 7 (ref 5–15)
BUN: 17 mg/dL (ref 6–20)
CALCIUM: 9.3 mg/dL (ref 8.9–10.3)
CO2: 28 mmol/L (ref 22–32)
CREATININE: 0.92 mg/dL (ref 0.61–1.24)
Chloride: 103 mmol/L (ref 101–111)
Glucose, Bld: 110 mg/dL — ABNORMAL HIGH (ref 65–99)
Potassium: 3.7 mmol/L (ref 3.5–5.1)
SODIUM: 138 mmol/L (ref 135–145)

## 2017-01-19 LAB — I-STAT CG4 LACTIC ACID, ED
LACTIC ACID, VENOUS: 0.89 mmol/L (ref 0.5–1.9)
Lactic Acid, Venous: 0.9 mmol/L (ref 0.5–1.9)

## 2017-01-19 LAB — INFLUENZA PANEL BY PCR (TYPE A & B)
Influenza A By PCR: NEGATIVE
Influenza B By PCR: POSITIVE — AB

## 2017-01-19 MED ORDER — MEMANTINE HCL ER 7 MG PO CP24
21.0000 mg | ORAL_CAPSULE | Freq: Every day | ORAL | Status: DC
  Administered 2017-01-20 – 2017-01-21 (×2): 21 mg via ORAL
  Filled 2017-01-19 (×2): qty 3

## 2017-01-19 MED ORDER — DHA-EPA-VITAMIN E 200-300-5 MG-MG-UNIT PO CAPS: 1.0000 | ORAL_CAPSULE | Freq: Two times a day (BID) | ORAL | Status: DC | PRN

## 2017-01-19 MED ORDER — FLUTICASONE PROPIONATE 50 MCG/ACT NA SUSP
2.0000 | Freq: Every day | NASAL | Status: DC
  Administered 2017-01-20 – 2017-01-21 (×2): 2 via NASAL
  Filled 2017-01-19: qty 16

## 2017-01-19 MED ORDER — AMLODIPINE BESYLATE 5 MG PO TABS
10.0000 mg | ORAL_TABLET | Freq: Every day | ORAL | Status: DC
  Administered 2017-01-20 – 2017-01-21 (×2): 10 mg via ORAL
  Filled 2017-01-19 (×2): qty 2

## 2017-01-19 MED ORDER — ALBUTEROL SULFATE (2.5 MG/3ML) 0.083% IN NEBU: 2.5000 mg | INHALATION_SOLUTION | Freq: Four times a day (QID) | RESPIRATORY_TRACT | Status: DC | PRN

## 2017-01-19 MED ORDER — ACETAMINOPHEN 650 MG RE SUPP: 650.0000 mg | Freq: Four times a day (QID) | RECTAL | Status: DC | PRN

## 2017-01-19 MED ORDER — ACETAMINOPHEN 325 MG PO TABS: 650.0000 mg | ORAL_TABLET | Freq: Four times a day (QID) | ORAL | Status: DC | PRN

## 2017-01-19 MED ORDER — BISACODYL 5 MG PO TBEC: 5.0000 mg | DELAYED_RELEASE_TABLET | Freq: Every day | ORAL | Status: DC | PRN

## 2017-01-19 MED ORDER — INSULIN ASPART 100 UNIT/ML ~~LOC~~ SOLN: 0.0000 [IU] | Freq: Every day | SUBCUTANEOUS | Status: DC

## 2017-01-19 MED ORDER — SENNOSIDES-DOCUSATE SODIUM 8.6-50 MG PO TABS: 1.0000 | ORAL_TABLET | Freq: Every evening | ORAL | Status: DC | PRN

## 2017-01-19 MED ORDER — OSELTAMIVIR PHOSPHATE 75 MG PO CAPS
75.0000 mg | ORAL_CAPSULE | Freq: Two times a day (BID) | ORAL | Status: DC
  Administered 2017-01-20 – 2017-01-21 (×4): 75 mg via ORAL
  Filled 2017-01-19 (×4): qty 1

## 2017-01-19 MED ORDER — MAGNESIUM CITRATE PO SOLN: 1.0000 | Freq: Once | ORAL | Status: DC | PRN

## 2017-01-19 MED ORDER — HEPARIN SODIUM (PORCINE) 5000 UNIT/ML IJ SOLN: 5000.0000 [IU] | Freq: Three times a day (TID) | INTRAMUSCULAR | Status: DC

## 2017-01-19 MED ORDER — ONDANSETRON HCL 4 MG PO TABS: 4.0000 mg | ORAL_TABLET | Freq: Four times a day (QID) | ORAL | Status: DC | PRN

## 2017-01-19 MED ORDER — LISINOPRIL 40 MG PO TABS
40.0000 mg | ORAL_TABLET | Freq: Every day | ORAL | Status: DC
  Administered 2017-01-20 – 2017-01-21 (×2): 40 mg via ORAL
  Filled 2017-01-19 (×2): qty 2
  Filled 2017-01-19 (×2): qty 1

## 2017-01-19 MED ORDER — DONEPEZIL HCL 5 MG PO TABS
10.0000 mg | ORAL_TABLET | Freq: Every day | ORAL | Status: DC
  Administered 2017-01-20 (×2): 10 mg via ORAL
  Filled 2017-01-19 (×2): qty 2

## 2017-01-19 MED ORDER — VITAMIN E 45 MG (100 UNIT) PO CAPS
100.0000 [IU] | ORAL_CAPSULE | Freq: Two times a day (BID) | ORAL | Status: DC | PRN
  Filled 2017-01-19: qty 1

## 2017-01-19 MED ORDER — VITAMIN D3 25 MCG (1000 UNIT) PO TABS
2000.0000 [IU] | ORAL_TABLET | Freq: Every day | ORAL | Status: DC
  Administered 2017-01-20 – 2017-01-21 (×2): 2000 [IU] via ORAL
  Filled 2017-01-19 (×2): qty 2

## 2017-01-19 MED ORDER — ROSUVASTATIN CALCIUM 5 MG PO TABS
5.0000 mg | ORAL_TABLET | Freq: Every day | ORAL | Status: DC
  Administered 2017-01-20 (×2): 5 mg via ORAL
  Filled 2017-01-19 (×2): qty 1

## 2017-01-19 MED ORDER — SODIUM CHLORIDE 0.9 % IV SOLN
INTRAVENOUS | Status: DC
  Administered 2017-01-20 (×2): via INTRAVENOUS

## 2017-01-19 MED ORDER — LEVOTHYROXINE SODIUM 125 MCG PO TABS
125.0000 ug | ORAL_TABLET | Freq: Every day | ORAL | Status: DC
  Administered 2017-01-20 – 2017-01-21 (×2): 125 ug via ORAL
  Filled 2017-01-19 (×2): qty 1

## 2017-01-19 MED ORDER — CARBIDOPA-LEVODOPA ER 50-200 MG PO TBCR
1.0000 | EXTENDED_RELEASE_TABLET | ORAL | Status: DC
  Administered 2017-01-20 – 2017-01-21 (×6): 1 via ORAL
  Filled 2017-01-19 (×8): qty 1

## 2017-01-19 MED ORDER — SODIUM CHLORIDE 0.9% FLUSH: 3.0000 mL | Freq: Two times a day (BID) | INTRAVENOUS | Status: DC

## 2017-01-19 MED ORDER — POLYVINYL ALCOHOL 1.4 % OP SOLN
1.0000 [drp] | Freq: Three times a day (TID) | OPHTHALMIC | Status: DC | PRN
  Administered 2017-01-20: 1 [drp] via OPHTHALMIC
  Filled 2017-01-19: qty 15

## 2017-01-19 MED ORDER — CINNAMON 500 MG PO CAPS: 1000.0000 mg | ORAL_CAPSULE | Freq: Every day | ORAL | Status: DC

## 2017-01-19 MED ORDER — INSULIN ASPART 100 UNIT/ML ~~LOC~~ SOLN
0.0000 [IU] | Freq: Three times a day (TID) | SUBCUTANEOUS | Status: DC
  Administered 2017-01-20: 3 [IU] via SUBCUTANEOUS
  Administered 2017-01-21: 2 [IU] via SUBCUTANEOUS

## 2017-01-19 MED ORDER — ONDANSETRON HCL 4 MG/2ML IJ SOLN: 4.0000 mg | Freq: Four times a day (QID) | INTRAMUSCULAR | Status: DC | PRN

## 2017-01-19 MED ORDER — OXYCODONE HCL 5 MG PO TABS: 5.0000 mg | ORAL_TABLET | ORAL | Status: DC | PRN

## 2017-01-19 MED ORDER — NEBIVOLOL HCL 5 MG PO TABS
5.0000 mg | ORAL_TABLET | Freq: Every day | ORAL | Status: DC
  Administered 2017-01-20 – 2017-01-21 (×2): 5 mg via ORAL
  Filled 2017-01-19 (×2): qty 1

## 2017-01-19 MED ORDER — IPRATROPIUM BROMIDE 0.02 % IN SOLN: 0.5000 mg | Freq: Four times a day (QID) | RESPIRATORY_TRACT | Status: DC | PRN

## 2017-01-19 MED ORDER — LEVOFLOXACIN 750 MG PO TABS
750.0000 mg | ORAL_TABLET | Freq: Every day | ORAL | Status: DC
  Administered 2017-01-20 – 2017-01-21 (×2): 750 mg via ORAL
  Filled 2017-01-19 (×2): qty 1

## 2017-01-19 NOTE — ED Provider Notes (Signed)
[facility] DEPT Provider Note   CSN: [id number] Arrival date & time: 01/19/17  1502     History   Chief Complaint Chief Complaint  Patient presents with  . AMS    HPI Troy [name] is a 76 y.o. male.  HPI Patient presents to the emergency department with altered mental status.  The wife gives me the history.  The patient started Wednesday night, having altered mental status.  She does on Thursday morning he was very weak and unable to stand without significant assistance.  Patient was not able to stand because of weakness in his legs.  The wife states that is like his legs were locked. patient is recently treated for a sinus infection by the walk-in clinic.  The wife states that he finished doxycycline on Tuesday. The patient denies chest pain, shortness of breath, headache,blurred vision, neck pain, fever, numbness, dizziness, anorexia, edema, abdominal pain, nausea, vomiting, diarrhea, rash, back pain, dysuria, hematemesis, bloody stool, near syncope, or syncope. Past Medical History:  Diagnosis Date  . AAA (abdominal aortic aneurysm) ([place])    with mural thrombus  . Chronic anticoagulation   . CKD (chronic kidney disease) stage 2, GFR 60-89 ml/min   . Constipation   . Coronary artery disease 07/2009   severe 3 vessel ASCAD s/p CABG with LIMA to LAD, SVG to diag, SVG to PL  . Dementia   . Diabetes mellitus   . Diverticulosis   . Facial nerve spasm    L eye, tx'd with botox  . Fecal impaction ([place])   . Gout   . Hepatic steatosis   . Hyperlipidemia   . Hypertension   . Hypothyroidism   . OSA (obstructive sleep apnea)    intolerant to CPAP followed by Dr. Lamonte Sakai  . Parkinsonism ([place])   . Persistent atrial fibrillation [facility])     Patient Active Problem List   Diagnosis Date Noted  . 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 Parkinsonism ([place]) 07/09/2014  . Mild cognitive impairment 07/09/2014  . 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  Encounter for therapeutic drug monitoring 12/17/2013  . 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 Hypertension   . Hyperlipidemia   . CKD (chronic kidney disease) stage 2, GFR 60-89 ml/min   . OSA (obstructive sleep apnea)   . Hepatic steatosis   . Chronic anticoagulation   . Abnormal gait 09/28/2013  . 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 Persistent atrial fibrillation ([place]) 08/28/2013  . AAA (abdominal aortic aneurysm) without rupture ([place]) 04/14/2013  . Aftercare following surgery of the circulatory system, [place] 04/14/2013  . Coronary artery disease 07/27/2009    Past Surgical History:  Procedure Laterality Date  . ABDOMINAL AORTIC ANEURYSM REPAIR  01-2010   aorto-bi-iliac Gore Excluder stent graft  . CORONARY ARTERY BYPASS GRAFT  07-2009  . EYE SURGERY Left May  2013   Cataract  . EYE SURGERY Right June 2013   Cataract  . [place] Medications    Prior to Admission medications   Medication Sig Start Date End Date Taking? Authorizing Provider  amLODipine (NORVASC) 10 MG tablet TAKE ONE TABLET BY MOUTH ONCE DAILY 12/31/16   Sueanne Margarita, MD  carbidopa-levodopa (SINEMET CR) 50-200 MG tablet Take 1 tablet by mouth 4 (four) times daily. 7, 11, 3 pm and 7 pm 09/06/16   Star Age, MD  carboxymethylcellulose (REFRESH PLUS) 0.5 % SOLN  Place 1 drop into both eyes 3 (three) times daily as needed (dry eyes).    Historical Provider, MD  Cholecalciferol (VITAMIN D-3) 1000 UNITS CAPS Take 2,000 Units by mouth daily.     Historical Provider, MD  Cinnamon 500 MG capsule Take 1,000 mg by mouth daily.     Historical Provider, MD  clindamycin (CLEOCIN) 150 MG capsule Only before dental appointments 06/17/14   Historical Provider, MD  clonazePAM (KLONOPIN) 0.25 MG disintegrating tablet Take 2 tablets (0.5 mg total) by mouth at bedtime. 01/08/17   Star Age, MD  DHA-EPA-Vitamin E [id number] MG-MG-UNIT CAPS Take 1 capsule by mouth 2 (two) times  daily as needed (dry eyes).     Historical Provider, MD  donepezil (ARICEPT) 10 MG tablet Take 1 tablet (10 mg total) by mouth at bedtime. 09/06/16   Star Age, MD  doxycycline (VIBRAMYCIN) 100 MG capsule Take 100 mg by mouth 2 (two) times daily. 01/05/17   Historical Provider, MD  EPIPEN 2-PAK 0.3 MG/0.3ML SOAJ Inject 0.3 mg into the muscle once as needed. Allergic reaction 02/25/13   Historical Provider, MD  fluticasone (FLONASE) 50 MCG/ACT nasal spray Place 2 sprays into the nose daily as needed for allergies.     Historical Provider, MD  levothyroxine (SYNTHROID, LEVOTHROID) 125 MCG tablet Take 125 mcg by mouth daily.      Historical Provider, MD  lisinopril (PRINIVIL,ZESTRIL) 40 MG tablet TAKE ONE TABLET BY MOUTH ONCE DAILY 12/03/16   Sueanne Margarita, MD  memantine (NAMENDA) 10 MG tablet Take 1 tablet (10 mg total) by mouth 2 (two) times daily. 01/08/17   Star Age, MD  metoprolol tartrate (LOPRESSOR) 25 MG tablet Take 1 tablet (25 mg total) by mouth 2 (two) times daily. 12/06/16 03/06/17  Sueanne Margarita, MD  OVER THE COUNTER MEDICATION Take 1 tablet by mouth 2 (two) times daily. Macular degeneration supplement    Historical Provider, MD  OVER THE COUNTER MEDICATION Take 1 capsule by mouth daily. Homocysteine supplement    Historical Provider, MD  rosuvastatin (CRESTOR) 5 MG tablet Take 1 tablet (5 mg total) by mouth daily. 05/31/16   Sueanne Margarita, MD  warfarin (COUMADIN) 5 MG tablet TAKE ONE TABLET BY MOUTH ONCE DAILY AS DIRECTED 09/03/16   Sueanne Margarita, MD    Family History Family History  Problem Relation Age of Onset  . Heart disease Mother     CHF  . Alzheimer's disease Mother   . Alzheimer's disease Father   . Alzheimer's disease Brother   . Alzheimer's disease Paternal Uncle     Social History Social History  Substance Use Topics  . Smoking status: Former Smoker    Years: 15.00    Types: Cigarettes    Quit date: 10/07/2008  . Smokeless tobacco: Never Used  . Alcohol use 1.2  - 1.8 oz/week    2 - 3 Glasses of wine per week     Comment: A week     Allergies   Bee venom; Other; Tamsulosin; Penicillins; and Tetanus toxoids   Review of Systems Review of Systems  All other systems negative except as documented in the HPI. All pertinent positives and negatives as reviewed in the HPI. Physical Exam Updated Vital Signs BP 113/70   Pulse (!) 55   Temp 97.9 F (36.6 C)   Resp 16   SpO2 93%   Physical Exam  Constitutional: He appears well-developed and well-nourished. He is sleeping. He is easily aroused. He does not have a  sickly appearance. He does not appear ill. No distress.  HENT:  Head: Normocephalic and atraumatic.  Mouth/Throat: Oropharynx is clear and moist.  Eyes: Pupils are equal, round, and reactive to light.  Neck: Normal range of motion. Neck supple.  Cardiovascular: Normal rate, regular rhythm and normal heart sounds.  Exam reveals no gallop and no friction rub.   No murmur heard. Pulmonary/Chest: Effort normal and breath sounds normal. No respiratory distress. He has no wheezes.  Abdominal: Soft. Bowel sounds are normal. He exhibits no distension. There is no tenderness.  Neurological: He is alert and easily aroused. He exhibits normal muscle tone. Coordination normal.  Skin: Skin is warm and dry. Capillary refill takes less than 2 seconds. No rash noted. No erythema.  Psychiatric: He has a normal mood and affect. His behavior is normal.  Nursing note and vitals reviewed.    ED Treatments / Results  Labs (all labs ordered are listed, but only abnormal results are displayed) Labs Reviewed  CBC - Abnormal; Notable for the following:       Result Value   Platelets 121 (*)    All other components within normal limits  BASIC METABOLIC PANEL - Abnormal; Notable for the following:    Glucose, Bld 110 (*)    All other components within normal limits  URINALYSIS, ROUTINE W REFLEX MICROSCOPIC  I-STAT CG4 LACTIC ACID, ED    EKG  EKG  Interpretation None       Radiology Dg Chest 2 View  Result Date: 01/18/2017 CLINICAL DATA:  Cough for several days EXAM: CHEST  2 VIEW COMPARISON:  01/14/2011 FINDINGS: Cardiomediastinal silhouette is stable. No infiltrate or pleural effusion. No pulmonary edema. Status post CABG. Bony thorax is unremarkable. IMPRESSION: No active cardiopulmonary disease. Electronically Signed   By: Lahoma Crocker M.D.   On: 01/18/2017 12:16    Procedures Procedures (including critical care time)  Medications Ordered in ED Medications - No data to display   Initial Impression / Assessment and Plan / ED Course  I have reviewed the triage vital signs and the nursing notes.  Pertinent labs & imaging results that were available during my care of the patient were reviewed by me and considered in my medical decision making (see chart for details).     Patient be worked up for altered mental status, possible septic issues.  Patient was sleeping during most of my exam.  The wife is giving me all the history.  The patient would arouse and answer some questions    Patient will need admission to the hospital for further evaluation of what appears to be a febrile illness with weakness and altered mental status.  Patient has been stable here in the emergency department, spoke with the [facility] hospitalist, who will admit the patient    Final Clinical Impressions(s) / ED Diagnoses   Final diagnoses:  None    New Prescriptions New Prescriptions   No medications on file     Dalia Heading, PA-C 01/20/17 [place], MD 01/20/17 [phone]

## 2017-01-19 NOTE — H&P (Addendum)
History and Physical   [facility] - [place] @ [facility] Admission History and Physical [employer], D.O.    Patient Name: Troy [name] MR#: [id number] Date of Birth: [birth year] Date of Admission: 01/19/2017  Referring MD/NP/PA: Dr. Venora Maples Primary Care Physician: Lilian Coma, MD Outpatient Specialists: Dr. Malvin Johns (cardio), Dr. Rexene Alberts (Neuro), Dr. Lelan Pons (vasc sx),  Patient coming from: Home Chief Complaint: AMS  HPI: Troy [name] is a 76 y.o. male with a known history of CKD, CAD s/p CABG, DM, HTN, HLD, hypothyroidism, OSA, parkinson's, dementia, PAF on coumadin, AAA with mural thrombus was in a usual state of health until two days ago when his wife noticed a sudden change in his alertness, worsening weakness and gait instability.  He was diagnosed with and treated for sinusitis, completed 10 day course of doxy 4 days ago.  His nasal and sinus congestion progressed to include chest congestion with productive cough.     Of note, he was seen by neuro and meds were recently changed (clonazepam was decreased two weeks ago).  Otherwise there has been no change in status. Patient has been taking medication as prescribed and there has been no recent change in medication or diet.  There has been no recent hospitalizations, travel or sick contacts.    Patient denies fevers/chills, weakness, dizziness, chest pain, shortness of breath, N/V/C/D, abdominal pain, dysuria/frequency, changes in mental status.  Review of Systems:  CONSTITUTIONAL: No fever/chills, weight gain/loss, headache. Positive fatigue, weakness. EYES: No blurry or double vision. ENT: No tinnitus, postnasal drip, redness or soreness of the oropharynx. Positive nasal / sinus congestion.  RESPIRATORY: Positive productive cough, negative dyspnea, wheeze.  No hemoptysis.  CARDIOVASCULAR: No chest pain, palpitations, syncope, orthopnea. Positive lower extremity edema.  GASTROINTESTINAL: No nausea, vomiting, abdominal pain,  diarrhea, constipation.  No hematemesis, melena or hematochezia. GENITOURINARY: No dysuria, frequency, hematuria. ENDOCRINE: No polyuria or nocturia. No heat or cold intolerance. HEMATOLOGY: No anemia, bruising, bleeding. INTEGUMENTARY: No rashes, ulcers, lesions. MUSCULOSKELETAL: No arthritis, gout, dyspnea. NEUROLOGIC: No numbness, tingling, seizure-type activity, weakness. Positive gait ataxia (chronic) PSYCHIATRIC: No anxiety, depression, insomnia.   Past Medical History:  Diagnosis Date  . AAA (abdominal aortic aneurysm) ([place])    with mural thrombus  . Chronic anticoagulation   . CKD (chronic kidney disease) stage 2, GFR 60-89 ml/min   . Constipation   . Coronary artery disease 07/2009   severe 3 vessel ASCAD s/p CABG with LIMA to LAD, SVG to diag, SVG to PL  . Dementia   . Diabetes mellitus   . Diverticulosis   . Facial nerve spasm    L eye, tx'd with botox  . Fecal impaction ([place])   . Gout   . Hepatic steatosis   . Hyperlipidemia   . Hypertension   . Hypothyroidism   . OSA (obstructive sleep apnea)    intolerant to CPAP followed by Dr. Lamonte Sakai  . Parkinsonism ([place])   . Persistent atrial fibrillation [facility])     Past Surgical History:  Procedure Laterality Date  . ABDOMINAL AORTIC ANEURYSM REPAIR  01-2010   aorto-bi-iliac Gore Excluder stent graft  . CORONARY ARTERY BYPASS GRAFT  07-2009  . EYE SURGERY Left May  2013   Cataract  . EYE SURGERY Right June 2013   Cataract  . [place]     reports that he quit smoking about 8 years ago. His smoking use included Cigarettes. He quit after 15.00 years of use. He has never used smokeless tobacco. He reports that  he drinks about 1.2 - 1.8 oz of alcohol per week . He reports that he does not use drugs.  Allergies  Allergen Reactions  . Bee Venom Anaphylaxis  . Other Anaphylaxis    Other reaction(s): Laryngeal Edema (ALLERGY) "a heart medication" ? unknown  . Tamsulosin Other (See Comments)    Insomnia  .  Penicillins Rash  . Tetanus Toxoids Rash    Family History  Problem Relation Age of Onset  . Heart disease Mother     CHF  . Alzheimer's disease Mother   . Alzheimer's disease Father   . Alzheimer's disease Brother   . Alzheimer's disease Paternal Uncle    Family history has been reviewed and confirmed with patient.   Prior to Admission medications   Medication Sig Start Date End Date Taking? Authorizing Provider  amLODipine (NORVASC) 10 MG tablet TAKE ONE TABLET BY MOUTH ONCE DAILY 12/31/16  Yes Sueanne Margarita, MD  BYSTOLIC 5 MG tablet Take 5 mg by mouth daily. 10/30/16  Yes Historical Provider, MD  carbidopa-levodopa (SINEMET CR) 50-200 MG tablet Take 1 tablet by mouth 4 (four) times daily. 7, 11, 3 pm and 7 pm 09/06/16  Yes Star Age, MD  carboxymethylcellulose (REFRESH PLUS) 0.5 % SOLN Place 1 drop into both eyes 3 (three) times daily as needed (dry eyes).   Yes Historical Provider, MD  Cholecalciferol (VITAMIN D-3) 1000 UNITS CAPS Take 2,000 Units by mouth daily.    Yes Historical Provider, MD  Cinnamon 500 MG capsule Take 1,000 mg by mouth daily.    Yes Historical Provider, MD  clindamycin (CLEOCIN) 150 MG capsule Only before dental appointments 06/17/14  Yes Historical Provider, MD  clonazePAM (KLONOPIN) 0.25 MG disintegrating tablet Take 2 tablets (0.5 mg total) by mouth at bedtime. 01/08/17  Yes Star Age, MD  DHA-EPA-Vitamin E [id number] MG-MG-UNIT CAPS Take 1 capsule by mouth 2 (two) times daily as needed (dry eyes).    Yes Historical Provider, MD  donepezil (ARICEPT) 10 MG tablet Take 1 tablet (10 mg total) by mouth at bedtime. 09/06/16  Yes Star Age, MD  fluticasone (FLONASE) 50 MCG/ACT nasal spray Place 2 sprays into the nose daily as needed for allergies.    Yes Historical Provider, MD  levothyroxine (SYNTHROID, LEVOTHROID) 125 MCG tablet Take 125 mcg by mouth daily.     Yes Historical Provider, MD  lisinopril (PRINIVIL,ZESTRIL) 40 MG tablet TAKE ONE TABLET BY MOUTH ONCE  DAILY 12/03/16  Yes Sueanne Margarita, MD  NAMENDA XR 21 MG CP24 Take 21 mg by mouth daily. 01/01/17  Yes Historical Provider, MD  OVER THE COUNTER MEDICATION Take 1 tablet by mouth 2 (two) times daily. Macular degeneration supplement   Yes Historical Provider, MD  OVER THE COUNTER MEDICATION Take 1 capsule by mouth daily. Homocysteine supplement   Yes Historical Provider, MD  rosuvastatin (CRESTOR) 5 MG tablet Take 1 tablet (5 mg total) by mouth daily. 05/31/16  Yes Sueanne Margarita, MD  warfarin (COUMADIN) 5 MG tablet TAKE ONE TABLET BY MOUTH ONCE DAILY AS DIRECTED Patient taking differently: take 5mg  by mouth every day except on tuesdays. take 7.5mg  by mouth on tuesdays 09/03/16  Yes Traci R Turner, MD  EPIPEN 2-PAK 0.3 MG/0.3ML SOAJ Inject 0.3 mg into the muscle once as needed. Allergic reaction 02/25/13   Historical Provider, MD  metoprolol tartrate (LOPRESSOR) 25 MG tablet Take 1 tablet (25 mg total) by mouth 2 (two) times daily. 12/06/16 03/06/17  Sueanne Margarita, MD  Physical Exam: Vitals:   01/19/17 1752 01/19/17 1800 01/19/17 1900 01/19/17 2000  BP: 124/77 123/70 132/81 140/83  Pulse: 76 80 (!) 54 (!) 59  Resp: 18   18  Temp:      TempSrc:      SpO2: 92% 90% 90% 93%  Weight: 86.2 kg (190 lb)     Height: 5\' 7"  (1.702 m)       GENERAL: 76 y.o.-year-old white male patient, well-developed, well-nourished lying in the bed in no acute distress.  Pleasant and cooperative.   HEENT: Head atraumatic, normocephalic. Pupils equal, round, reactive to light and accommodation. No scleral icterus. Extraocular muscles intact. Nares are patent. Oropharynx is clear. Mucus membranes moist. Positive sinus tenderness to palpation.  NECK: Supple, full range of motion. No JVD, no bruit heard. No thyroid enlargement, no tenderness, no cervical lymphadenopathy. CHEST: Normal breath sounds bilaterally. No wheezing, rales, rhonchi or crackles. No use of accessory muscles of respiration.  No reproducible chest wall  tenderness.  CARDIOVASCULAR: S1, S2 normal. No murmurs, rubs, or gallops. Cap refill <2 seconds. Pulses intact distally.  ABDOMEN: Soft, nondistended, nontender. No rebound, guarding, rigidity. Normoactive bowel sounds present in all four quadrants. No organomegaly or mass. EXTREMITIES: Bilateral pitting edema to mid calf.  No cyanosis, or clubbing. No calf tenderness or Homan's sign.  NEUROLOGIC: The patient is alert and oriented x 3. Cranial nerves II through XII are grossly intact with no focal sensorimotor deficit. Muscle strength 5/5 in all extremities. Sensation intact. Gait not checked. Speech is sluggish but clear (baseline). PSYCHIATRIC:  Normal affect, mood, thought content. SKIN: Warm, dry, and intact without obvious rash, lesion, or ulcer.    Labs on Admission:  CBC:  Recent Labs Lab 01/19/17 1528  WBC 5.3  HGB 14.2  HCT 42.1  MCV 89.0  PLT 123XX123*   Basic Metabolic Panel:  Recent Labs Lab 01/19/17 1528  NA 138  K 3.7  CL 103  CO2 28  GLUCOSE 110*  BUN 17  CREATININE 0.92  CALCIUM 9.3   GFR: Estimated Creatinine Clearance: 70.5 mL/min (by C-G formula based on SCr of 0.92 mg/dL). Liver Function Tests: No results for input(s): AST, ALT, ALKPHOS, BILITOT, PROT, ALBUMIN in the last 168 hours. No results for input(s): LIPASE, AMYLASE in the last 168 hours. No results for input(s): AMMONIA in the last 168 hours. Coagulation Profile: No results for input(s): INR, PROTIME in the last 168 hours. Cardiac Enzymes: No results for input(s): CKTOTAL, CKMB, CKMBINDEX, TROPONINI in the last 168 hours. BNP (last 3 results) No results for input(s): PROBNP in the last 8760 hours. HbA1C: No results for input(s): HGBA1C in the last 72 hours. CBG: No results for input(s): GLUCAP in the last 168 hours. Lipid Profile: No results for input(s): CHOL, HDL, LDLCALC, TRIG, CHOLHDL, LDLDIRECT in the last 72 hours. Thyroid Function Tests: No results for input(s): TSH, T4TOTAL,  FREET4, T3FREE, THYROIDAB in the last 72 hours. Anemia Panel: No results for input(s): VITAMINB12, FOLATE, FERRITIN, TIBC, IRON, RETICCTPCT in the last 72 hours. Urine analysis:    Component Value Date/Time   COLORURINE YELLOW 01/19/2017 1750   APPEARANCEUR CLEAR 01/19/2017 1750   LABSPEC 1.026 01/19/2017 1750   PHURINE 5.0 01/19/2017 1750   GLUCOSEU NEGATIVE 01/19/2017 1750   HGBUR NEGATIVE 01/19/2017 1750   BILIRUBINUR NEGATIVE 01/19/2017 1750   KETONESUR 5 (A) 01/19/2017 1750   PROTEINUR 100 (A) 01/19/2017 1750   UROBILINOGEN 0.2 10/02/2012 1627   NITRITE NEGATIVE 01/19/2017 1750   LEUKOCYTESUR  NEGATIVE 01/19/2017 1750   Sepsis Labs: @LABRCNTIP (procalcitonin:4,lacticidven:4) )No results found for this or any previous visit (from the past 240 hour(s)).   Radiological Exams on Admission: Dg Chest 2 View  Result Date: 01/19/2017 CLINICAL DATA:  Increased urinary frequency and incontinence. Back pain. EXAM: CHEST  2 VIEW COMPARISON:  PA and lateral chest 01/18/2017 and 11/14/2011. FINDINGS: There is cardiomegaly without edema. No consolidative process, pneumothorax or effusion. Aortic atherosclerosis is noted. The patient is status post CABG. IMPRESSION: Cardiomegaly without acute disease. Electronically Signed   By: Inge Rise M.D.   On: 01/19/2017 17:06   Dg Chest 2 View  Result Date: 01/18/2017 CLINICAL DATA:  Cough for several days EXAM: CHEST  2 VIEW COMPARISON:  01/14/2011 FINDINGS: Cardiomediastinal silhouette is stable. No infiltrate or pleural effusion. No pulmonary edema. Status post CABG. Bony thorax is unremarkable. IMPRESSION: No active cardiopulmonary disease. Electronically Signed   By: Lahoma Crocker M.D.   On: 01/18/2017 12:16   Ct Head Wo Contrast  Result Date: 01/19/2017 CLINICAL DATA:  states he has been treated for sinus infection-finished antibiotic-states increase in Parkinson's symptoms-states decrease in cognitive abilities-states he was ambulating with  walker until Wednesday-states went to PCP but they sent him here-wife states increased urination and incontinence-was complaining of back pain earlier-wife thinks infection is causing symptoms-history of dementia EXAM: CT HEAD WITHOUT CONTRAST TECHNIQUE: Contiguous axial images were obtained from the base of the skull through the vertex without intravenous contrast. COMPARISON:  None. FINDINGS: Brain: The ventricles are normal in configuration. There is ventricular enlargement, mostly of the atrium of the lateral ventricles, as well as sulcal enlargement, reflecting mild to moderate diffuse atrophy. No hydrocephalus. There are no parenchymal masses or mass effect. There is no evidence of a recent cortical infarct. Mild periventricular white matter hypoattenuation is noted consistent with chronic microvascular ischemic change. There are no extra-axial masses or abnormal fluid collections. There is no intracranial hemorrhage. Vascular: Vessels of the skullbase are prominent. There is no hyperdense vessel to suggest thrombosis. Skull: No fracture or skull lesion. Sinuses/Orbits: There is dependent fluid in the left maxillary sinus where there is also moderate mucosal thickening. Moderate mucosal thickening lines the left ethmoid air cells with mild inferior left frontal sinus mucosal thickening. Remaining sinuses are clear. Clear mastoid air cells. Globes and orbits are unremarkable. Other: None. IMPRESSION: 1. No acute intracranial abnormalities. 2. Atrophy and mild chronic microvascular ischemic change. 3. Findings are consistent with acute sinusitis with a left maxillary sinus fluid level and left ethmoid, maxillary and inferior left frontal sinus mucosal thickening. Electronically Signed   By: Lajean Manes M.D.   On: 01/19/2017 18:11    EKG: Pending  Assessment/Plan  This is a 76 y.o. male with a history of  CKD, CAD s/p CABG, DM, HTN, HLD, hypothyroidism, OSA, parkinson's, dementia, PAF on coumadin, AAA  with mural thrombus  now being admitted with:  1. Altered mental status, likely secondary to Influenza B infection - Admit observation, telemetry  - Start Tamiflu - Neurochecks - Check sputum and blood cultures given recent antibiotic use (Doxy for sinus infection) - Hold clonazepam for now.   - Check troponins, TSH  2. Acute recurrent maxillary sinusitis, no evidence of sepsis, recent course of Doxy - Moxifloxacin - Check MRSA  2. H/o CKD, Cr stable - Monitor BMP  3. H/o Diabetes - Accuchecks achs with RISS coverage - Heart healthy, carb controlled diet  4. H/o CAD s/p CABG - Monitor on tele  5. H/o  HTN - Continue Norvasc, Bystolic  6. H/o HLD - Continue Crestor  7. H/o hypothyroidism - Continue Synthroid  8. H/o Parkinson's - Continue Sinemet  9. H/o dementia - Continue Aricept, Namenda  10. H/o PAF - Continue Bystolic, Coumadin per pharmacy - INR pending - EKG pending   Admission status: Observation IV Fluids: NS Diet/Nutrition: HH, CC Consults called: None  DVT Px: Coumadin, SCDs and early ambulation. Code Status: Full Code  Disposition Plan: To home in <24 hours   All the records are reviewed and case discussed with ED provider. Management plans discussed with the patient and his wife who express understanding and agree with plan of care.  Breonia Kirstein D.O. on 01/19/2017 at 10:02 PM Between 7am to 6pm - Pager - [phone] After 6pm go to [email or web address] - [employer] [facility] Office [phone] CC: Primary care physician; Lilian Coma, MD   01/19/2017, 10:02 PM

## 2017-01-19 NOTE — Progress Notes (Signed)
PHARMACIST - PHYSICIAN ORDER COMMUNICATION  CONCERNING: P&T Medication Policy on Herbal Medications  DESCRIPTION:  This patient's order for:  Cinnamon and DHA-EPA-Vitamin E supplemements  have been noted.  This product(s) is classified as an "herbal" or natural product. Due to a lack of definitive safety studies or FDA approval, nonstandard manufacturing practices, plus the potential risk of unknown drug-drug interactions while on inpatient medications, the Pharmacy and Therapeutics Committee does not permit the use of "herbal" or natural products of this type within [facility].   ACTION TAKEN: The pharmacy department is unable to verify this order at this time and your patient has been informed of this safety policy. Please reevaluate patient's clinical condition at discharge and address if the herbal or natural product(s) should be resumed at that time  Rx did order Vit E 100 in place of the DHA-EPA-Vitamin E supplement but the Cinnamon was discontinued   Thanks Dorrene German 01/19/2017 11:55 PM

## 2017-01-19 NOTE — Progress Notes (Signed)
ANTICOAGULATION CONSULT NOTE - Initial Consult  Pharmacy Consult for Warfarin Indication: atrial fibrillation  Allergies  Allergen Reactions  . Bee Venom Anaphylaxis  . Other Anaphylaxis    Other reaction(s): Laryngeal Edema (ALLERGY) "a heart medication" ? unknown  . Tamsulosin Other (See Comments)    Insomnia  . Penicillins Rash  . Tetanus Toxoids Rash    Patient Measurements: Height: 5\' 7"  (170.2 cm) Weight: 190 lb (86.2 kg) IBW/kg (Calculated) : 66.1   Vital Signs: Temp: 100.6 F (38.1 C) (02/24 1658) Temp Source: Rectal (02/24 1658) BP: 131/80 (02/24 2200) Pulse Rate: 55 (02/24 2200)  Labs:  Recent Labs  01/19/17 1528  HGB 14.2  HCT 42.1  PLT 121*  CREATININE 0.92    Estimated Creatinine Clearance: 70.5 mL/min (by C-G formula based on SCr of 0.92 mg/dL).   Medical History: Past Medical History:  Diagnosis Date  . AAA (abdominal aortic aneurysm) ([place])    with mural thrombus  . Chronic anticoagulation   . CKD (chronic kidney disease) stage 2, GFR 60-89 ml/min   . Constipation   . Coronary artery disease 07/2009   severe 3 vessel ASCAD s/p CABG with LIMA to LAD, SVG to diag, SVG to PL  . Dementia   . Diabetes mellitus   . Diverticulosis   . Facial nerve spasm    L eye, tx'd with botox  . Fecal impaction ([place])   . Gout   . Hepatic steatosis   . Hyperlipidemia   . Hypertension   . Hypothyroidism   . OSA (obstructive sleep apnea)    intolerant to CPAP followed by Dr. Lamonte Sakai  . Parkinsonism ([place])   . Persistent atrial fibrillation ([facility])     Medications:  Scheduled:  . [START ON 01/20/2017] amLODipine  10 mg Oral Daily  . carbidopa-levodopa  1 tablet Oral QID  . [START ON 01/20/2017] Cinnamon  1,000 mg Oral Daily  . donepezil  10 mg Oral QHS  . [START ON 01/20/2017] fluticasone  2 spray Each Nare Daily  . [START ON 01/20/2017] insulin aspart  0-15 Units Subcutaneous TID WC  . insulin aspart  0-5 Units Subcutaneous QHS  . [START ON 01/20/2017]  levothyroxine  125 mcg Oral Daily  . [START ON 01/20/2017] lisinopril  40 mg Oral Daily  . [START ON 01/20/2017] Memantine HCl ER  21 mg Oral Daily  . [START ON 01/20/2017] nebivolol  5 mg Oral Daily  . [START ON 01/20/2017] rosuvastatin  5 mg Oral Daily  . sodium chloride flush  3 mL Intravenous Q12H  . [START ON 01/20/2017] Vitamin D-3  2,000 Units Oral Daily   Infusions:  . sodium chloride      Assessment: 6 yoM with altered mental status and incontinence on warfarin for A-fib. INR=1.49 on admission Goal of Therapy:  INR 2-3   Plan:  Give 7.5 mg Warfarin x1 this am 0800 Daily PT/INR  Lawana Pai R 01/19/2017,10:48 PM

## 2017-01-19 NOTE — ED Triage Notes (Addendum)
Per wife-states he has been treated for sinus infection-finished antibiotic-states increase in Parkinson's symptoms-states decrease in cognitive abilities-states he was ambulating with walker until Wednesday-states went to PCP but they sent him here-wife states increased urination and incontinence-was complaining of back pain earlier-wife thinks infection is causing symptoms-history of dementia

## 2017-01-19 NOTE — ED Notes (Signed)
Bed: WA16 Expected date:  Expected time:  Means of arrival:  Comments: Hold for triage 1 

## 2017-01-19 NOTE — ED Notes (Signed)
Received bedside report from [facility]

## 2017-01-19 NOTE — ED Notes (Signed)
Attempted to call report.  Christine RN says she will call back in a few minutes.

## 2017-01-20 ENCOUNTER — Encounter (HOSPITAL_COMMUNITY): Payer: Self-pay | Admitting: *Deleted

## 2017-01-20 DIAGNOSIS — Z8249 Family history of ischemic heart disease and other diseases of the circulatory system: Secondary | ICD-10-CM | POA: Diagnosis not present

## 2017-01-20 DIAGNOSIS — I10 Essential (primary) hypertension: Secondary | ICD-10-CM | POA: Diagnosis not present

## 2017-01-20 DIAGNOSIS — R404 Transient alteration of awareness: Secondary | ICD-10-CM | POA: Diagnosis not present

## 2017-01-20 DIAGNOSIS — R269 Unspecified abnormalities of gait and mobility: Secondary | ICD-10-CM | POA: Diagnosis present

## 2017-01-20 DIAGNOSIS — I129 Hypertensive chronic kidney disease with stage 1 through stage 4 chronic kidney disease, or unspecified chronic kidney disease: Secondary | ICD-10-CM | POA: Diagnosis not present

## 2017-01-20 DIAGNOSIS — E785 Hyperlipidemia, unspecified: Secondary | ICD-10-CM | POA: Diagnosis present

## 2017-01-20 DIAGNOSIS — G4733 Obstructive sleep apnea (adult) (pediatric): Secondary | ICD-10-CM | POA: Diagnosis not present

## 2017-01-20 DIAGNOSIS — Z820 Family history of epilepsy and other diseases of the nervous system: Secondary | ICD-10-CM | POA: Diagnosis not present

## 2017-01-20 DIAGNOSIS — R4182 Altered mental status, unspecified: Secondary | ICD-10-CM | POA: Diagnosis not present

## 2017-01-20 DIAGNOSIS — E876 Hypokalemia: Secondary | ICD-10-CM | POA: Diagnosis present

## 2017-01-20 DIAGNOSIS — Z951 Presence of aortocoronary bypass graft: Secondary | ICD-10-CM | POA: Diagnosis not present

## 2017-01-20 DIAGNOSIS — I481 Persistent atrial fibrillation: Secondary | ICD-10-CM

## 2017-01-20 DIAGNOSIS — Z7901 Long term (current) use of anticoagulants: Secondary | ICD-10-CM | POA: Diagnosis not present

## 2017-01-20 DIAGNOSIS — Z9842 Cataract extraction status, left eye: Secondary | ICD-10-CM | POA: Diagnosis not present

## 2017-01-20 DIAGNOSIS — R531 Weakness: Secondary | ICD-10-CM | POA: Diagnosis not present

## 2017-01-20 DIAGNOSIS — E039 Hypothyroidism, unspecified: Secondary | ICD-10-CM | POA: Diagnosis present

## 2017-01-20 DIAGNOSIS — K760 Fatty (change of) liver, not elsewhere classified: Secondary | ICD-10-CM | POA: Diagnosis present

## 2017-01-20 DIAGNOSIS — J101 Influenza due to other identified influenza virus with other respiratory manifestations: Secondary | ICD-10-CM | POA: Diagnosis not present

## 2017-01-20 DIAGNOSIS — N182 Chronic kidney disease, stage 2 (mild): Secondary | ICD-10-CM | POA: Diagnosis not present

## 2017-01-20 DIAGNOSIS — I2510 Atherosclerotic heart disease of native coronary artery without angina pectoris: Secondary | ICD-10-CM | POA: Diagnosis not present

## 2017-01-20 DIAGNOSIS — R410 Disorientation, unspecified: Secondary | ICD-10-CM

## 2017-01-20 DIAGNOSIS — Z9841 Cataract extraction status, right eye: Secondary | ICD-10-CM | POA: Diagnosis not present

## 2017-01-20 DIAGNOSIS — G20 Parkinson's disease: Secondary | ICD-10-CM

## 2017-01-20 DIAGNOSIS — E1122 Type 2 diabetes mellitus with diabetic chronic kidney disease: Secondary | ICD-10-CM | POA: Diagnosis not present

## 2017-01-20 DIAGNOSIS — I480 Paroxysmal atrial fibrillation: Secondary | ICD-10-CM | POA: Diagnosis not present

## 2017-01-20 DIAGNOSIS — Z79899 Other long term (current) drug therapy: Secondary | ICD-10-CM | POA: Diagnosis not present

## 2017-01-20 DIAGNOSIS — J0101 Acute recurrent maxillary sinusitis: Secondary | ICD-10-CM | POA: Diagnosis not present

## 2017-01-20 DIAGNOSIS — F0390 Unspecified dementia without behavioral disturbance: Secondary | ICD-10-CM | POA: Diagnosis not present

## 2017-01-20 LAB — PROTIME-INR
INR: 1.49
Prothrombin Time: 18.2 seconds — ABNORMAL HIGH (ref 11.4–15.2)

## 2017-01-20 LAB — BASIC METABOLIC PANEL
Anion gap: 5 (ref 5–15)
BUN: 15 mg/dL (ref 6–20)
CALCIUM: 9 mg/dL (ref 8.9–10.3)
CHLORIDE: 105 mmol/L (ref 101–111)
CO2: 28 mmol/L (ref 22–32)
CREATININE: 0.79 mg/dL (ref 0.61–1.24)
Glucose, Bld: 134 mg/dL — ABNORMAL HIGH (ref 65–99)
Potassium: 3.4 mmol/L — ABNORMAL LOW (ref 3.5–5.1)
SODIUM: 138 mmol/L (ref 135–145)

## 2017-01-20 LAB — MAGNESIUM: MAGNESIUM: 1.9 mg/dL (ref 1.7–2.4)

## 2017-01-20 LAB — GLUCOSE, CAPILLARY
GLUCOSE-CAPILLARY: 87 mg/dL (ref 65–99)
GLUCOSE-CAPILLARY: 102 mg/dL — AB (ref 65–99)
GLUCOSE-CAPILLARY: 106 mg/dL — AB (ref 65–99)
Glucose-Capillary: 132 mg/dL — ABNORMAL HIGH (ref 65–99)
Glucose-Capillary: 158 mg/dL — ABNORMAL HIGH (ref 65–99)

## 2017-01-20 LAB — TROPONIN I
Troponin I: <0.03 ng/mL (ref <0.03)
Troponin I: <0.03 ng/mL (ref <0.03)
Troponin I: <0.03 ng/mL (ref <0.03)

## 2017-01-20 LAB — PHOSPHORUS: PHOSPHORUS: 3.1 mg/dL (ref 2.5–4.6)

## 2017-01-20 LAB — CBC
HCT: 50.5 % (ref 39.0–52.0)
Hemoglobin: 17.7 g/dL — ABNORMAL HIGH (ref 13.0–17.0)
MCH: 31.2 pg (ref 26.0–34.0)
MCHC: 35 g/dL (ref 30.0–36.0)
MCV: 88.9 fL (ref 78.0–100.0)
PLATELETS: 76 10*3/uL — AB (ref 150–400)
RBC: 5.68 MIL/uL (ref 4.22–5.81)
RDW: 15 % (ref 11.5–15.5)
WBC: 3.2 10*3/uL — AB (ref 4.0–10.5)

## 2017-01-20 LAB — TSH: TSH: 0.718 u[IU]/mL (ref 0.350–4.500)

## 2017-01-20 LAB — MRSA PCR SCREENING: MRSA BY PCR: NEGATIVE

## 2017-01-20 MED ORDER — WARFARIN SODIUM 5 MG PO TABS
7.5000 mg | ORAL_TABLET | Freq: Once | ORAL | Status: AC
  Administered 2017-01-20: 7.5 mg via ORAL
  Filled 2017-01-20: qty 1

## 2017-01-20 MED ORDER — POTASSIUM CHLORIDE CRYS ER 20 MEQ PO TBCR
40.0000 meq | EXTENDED_RELEASE_TABLET | Freq: Once | ORAL | Status: AC
  Administered 2017-01-20: 40 meq via ORAL
  Filled 2017-01-20: qty 2

## 2017-01-20 MED ORDER — GUAIFENESIN ER 600 MG PO TB12
1200.0000 mg | ORAL_TABLET | Freq: Two times a day (BID) | ORAL | Status: DC
  Administered 2017-01-20 – 2017-01-21 (×3): 1200 mg via ORAL
  Filled 2017-01-20 (×3): qty 2

## 2017-01-20 MED ORDER — WARFARIN - PHARMACIST DOSING INPATIENT: Freq: Every day | Status: DC

## 2017-01-20 NOTE — Evaluation (Signed)
Physical Therapy Evaluation Patient Details Name: Troy [name] MRN: [id number] DOB: Jan 20, 1941 Today's Date: 01/20/2017   History of Present Illness  Pt admitted with AMS and weakness and with hx of A-fib, DM, CAD, CKD, AAA with repair, CABG and Parkinsons  Clinical Impression  Pt admitted as above and presenting with functional mobility limitations 2* generalized weakness, balance deficits, and limited endurance.  Pt and spouse hope to progress to dc home and would benefit from from HHPT follow up dependent on acute stay progress.    Follow Up Recommendations Home health PT    Equipment Recommendations  None recommended by PT    Recommendations for Other Services       Precautions / Restrictions Precautions Precautions: Fall Restrictions Weight Bearing Restrictions: No      Mobility  Bed Mobility Overal bed mobility: Needs Assistance Bed Mobility: Supine to Sit     Supine to sit: Min assist;HOB elevated     General bed mobility comments: increased time and min assist to complete transition to EOB sitting  Transfers Overall transfer level: Needs assistance Equipment used: Rolling walker (2 wheeled) Transfers: Sit to/from Stand Sit to Stand: From elevated surface;Min assist;Mod assist         General transfer comment: cues for use of UEs to self assist.  Ambulation/Gait Ambulation/Gait assistance: Min assist;Mod assist Ambulation Distance (Feet): 120 Feet Assistive device: Rolling walker (2 wheeled) Gait Pattern/deviations: Step-through pattern;Decreased step length - right;Decreased step length - left;Shuffle;Trunk flexed     General Gait Details: cues to decrease pace for saftey/balance,  Deterioration of balance noted with increasing fatigue  Stairs            Wheelchair Mobility    Modified Rankin (Stroke Patients Only)       Balance Overall balance assessment: Needs assistance Sitting-balance support: No upper extremity supported;Feet  supported Sitting balance-Leahy Scale: Fair     Standing balance support: Bilateral upper extremity supported Standing balance-Leahy Scale: Poor                               Pertinent Vitals/Pain Pain Assessment: No/denies pain    Home Living Family/patient expects to be discharged to:: Private residence Living Arrangements: Spouse/significant other Available Help at Discharge: Family Type of Home: House Home Access: Stairs to enter Entrance Stairs-Rails: Right Entrance Stairs-Number of Steps: 3 Home Layout: One level Home Equipment: Environmental consultant - 2 wheels;Cane - single point      Prior Function Level of Independence: Independent               Hand Dominance   Dominant Hand: Right    Extremity/Trunk Assessment   Upper Extremity Assessment Upper Extremity Assessment: Generalized weakness    Lower Extremity Assessment Lower Extremity Assessment: Generalized weakness       Communication   Communication: No difficulties  Cognition Arousal/Alertness: Awake/alert Behavior During Therapy: WFL for tasks assessed/performed Overall Cognitive Status: History of cognitive impairments - at baseline                 General Comments: Occasional cues repeated/reworded for pt understanding and follow through    General Comments      Exercises General Exercises - Lower Extremity Ankle Circles/Pumps: AROM;Both;20 reps   Assessment/Plan    PT Assessment Patient needs continued PT services  PT Problem List Decreased activity tolerance;Decreased balance;Decreased mobility;Decreased cognition;Decreased knowledge of use of DME       PT Treatment Interventions  DME instruction;Gait training;Stair training;Functional mobility training;Therapeutic activities;Therapeutic exercise;Balance training;Patient/family education    PT Goals (Current goals can be found in the Care Plan section)  Acute Rehab PT Goals Patient Stated Goal: Get moving PT Goal  Formulation: With patient Time For Goal Achievement: 02/02/17 Potential to Achieve Goals: Fair    Frequency Min 3X/week   Barriers to discharge        Co-evaluation               End of Session Equipment Utilized During Treatment: Gait belt Activity Tolerance: Patient tolerated treatment well;Patient limited by fatigue Patient left: in chair;with call bell/phone within reach;with family/visitor present Nurse Communication: Mobility status PT Visit Diagnosis: Unsteadiness on feet (R26.81);Difficulty in walking, not elsewhere classified (R26.2)    Functional Assessment Tool Used: AM-PAC 6 Clicks Basic Mobility Functional Limitation: Mobility: Walking and moving around Mobility: Walking and Moving Around Current Status [id number]): At least 40 percent but less than 60 percent impaired, limited or restricted Mobility: Walking and Moving Around Goal Status [phone]): At least 1 percent but less than 20 percent impaired, limited or restricted    Time: 1122-1150 PT Time Calculation (min) (ACUTE ONLY): 28 min   Charges:   PT Evaluation $PT Eval Low Complexity: 1 Procedure PT Treatments $Gait Training: 8-22 mins   PT G Codes:   PT G-Codes **NOT FOR INPATIENT CLASS** Functional Assessment Tool Used: AM-PAC 6 Clicks Basic Mobility Functional Limitation: Mobility: Walking and moving around Mobility: Walking and Moving Around Current Status [id number]): At least 40 percent but less than 60 percent impaired, limited or restricted Mobility: Walking and Moving Around Goal Status [phone]): At least 1 percent but less than 20 percent impaired, limited or restricted     Troy [name] 01/20/2017, 1:18 PM

## 2017-01-20 NOTE — Progress Notes (Signed)
[facility]  PROGRESS NOTE  Troy [name] [id number] DOB: 1941/03/13 DOA: 01/19/2017 PCP: Lilian Coma, MD   Brief HPI:    76 y.o. male with a known history of CKD, CAD s/p CABG, DM, HTN, HLD, hypothyroidism, OSA, parkinson's, dementia, PAF on coumadin, AAA with mural thrombus was in a usual state of health until two days ago when his wife noticed a sudden change in his alertness, worsening weakness and gait instability.  He was diagnosed with and treated for sinusitis, completed 10 day course of doxy 4 days ago.  His nasal and sinus congestion progressed to include chest congestion with productive cough.     Of note, he was seen by neuro and meds were recently changed (clonazepam was decreased two weeks ago).  Otherwise there has been no change in status. Patient has been taking medication as prescribed and there has been no recent change in medication or diet.  There has been no recent hospitalizations, travel or sick contacts.     Subjective   This morning patient feels better. Denies shortness of breath.   Assessment/Plan:     1. Altered mental status- currently at baseline, likely exacerbated by influenza B infection as well as recurrent maxillary sinusitis. 2. Upper respiratory infection, influenza B+- started on Tamiflu 3. Acute recurrent maxillary sinusitis- patient recently completed course of doxycycline, started on Levaquin. 4. Hypokalemia-we'll repeat his potassium and check BMP in a.m. 5. History of Parkinson disease- continue Sinemet, at baseline 6. History of dementia- no behavior disturbance at this time, continue Aricept, Namenda 7. History of paroxysmal atrial fibrillation-heart rate is controlled, continue Bystolic, Coumadin per pharmacy consultation 8. History of hypothyroidism- TSH 0.718, Continue Synthroid. 9. Diabetes mellitus- continue sliding scale insulin with NovoLog. 10. Hypertension-blood pressure stable, continue amlodipine 10 mg by mouth  daily    DVT prophylaxis: On warfarin  Code Status: Full code  Family Communication: Discussed with patient's wife at bedside   Disposition Plan: Likely home in next 24-48 hours   Consultants:  None  Procedures:  None  Continuous infusions . sodium chloride 75 mL/hr at 01/20/17 0126      Antibiotics:   Anti-infectives    Start     Dose/Rate Route Frequency Ordered Stop   01/20/17 1000  levofloxacin (LEVAQUIN) tablet 750 mg     750 mg Oral Daily 01/19/17 2340     01/19/17 2345  oseltamivir (TAMIFLU) capsule 75 mg     75 mg Oral 2 times daily 01/19/17 2340 01/24/17 2159       Objective   Vitals:   01/19/17 2246 01/19/17 2250 01/19/17 2315 01/20/17 0028  BP: 130/82   132/72  Pulse: (!) 55 (!) 52  (!) 56  Resp: 18 23  20   Temp:   99 F (37.2 C) 99.9 F (37.7 C)  TempSrc:   Oral Oral  SpO2: 92% 97%  98%  Weight:      Height:        Intake/Output Summary (Last 24 hours) at 01/20/17 1039 Last data filed at 01/20/17 0956  Gross per 24 hour  Intake             1140 ml  Output              850 ml  Net              290 ml   Filed Weights   01/19/17 1752  Weight: 86.2 kg (190 lb)     Physical Examination:  General  exam: Appears calm and comfortable. Respiratory system: Clear to auscultation. Respiratory effort normal. Cardiovascular system:  RRR. No  murmurs, rubs, gallops. No pedal edema. GI system: Abdomen is nondistended, soft and nontender. No organomegaly.  Central nervous system. No focal neurological deficits. 5 x 5 power in all extremities. Skin: No rashes, lesions or ulcers. Psychiatry: Alert, oriented x 3.Judgement and insight appear normal. Affect normal.    Data Reviewed: I have personally reviewed following labs and imaging studies  CBG:  Recent Labs Lab 01/20/17 0223 01/20/17 0837  GLUCAP 132* 158*    CBC:  Recent Labs Lab 01/19/17 1528 01/20/17 0523  WBC 5.3 3.2*  HGB 14.2 17.7*  HCT 42.1 50.5  MCV 89.0 88.9  PLT  121* 76*    Basic Metabolic Panel:  Recent Labs Lab 01/19/17 1528 01/20/17 0022 01/20/17 0523  NA 138  --  138  K 3.7  --  3.4*  CL 103  --  105  CO2 28  --  28  GLUCOSE 110*  --  134*  BUN 17  --  15  CREATININE 0.92  --  0.79  CALCIUM 9.3  --  9.0  MG  --  1.9  --   PHOS  --  3.1  --     Recent Results (from the past 240 hour(s))  MRSA PCR Screening     Status: None   Collection Time: 01/20/17  2:15 AM  Result Value Ref Range Status   MRSA by PCR NEGATIVE NEGATIVE Final    Comment:        The GeneXpert MRSA Assay (FDA approved for NASAL specimens only), is one component of a comprehensive MRSA colonization surveillance program. It is not intended to diagnose MRSA infection nor to guide or monitor treatment for MRSA infections.      Liver Function Tests: No results for input(s): AST, ALT, ALKPHOS, BILITOT, PROT, ALBUMIN in the last 168 hours. No results for input(s): LIPASE, AMYLASE in the last 168 hours. No results for input(s): AMMONIA in the last 168 hours.  Cardiac Enzymes:  Recent Labs Lab 01/20/17 0022 01/20/17 0523  TROPONINI <0.03 <0.03   BNP (last 3 results) No results for input(s): BNP in the last 8760 hours.  ProBNP (last 3 results) No results for input(s): PROBNP in the last 8760 hours.    Studies: Dg Chest 2 View  Result Date: 01/19/2017 CLINICAL DATA:  Increased urinary frequency and incontinence. Back pain. EXAM: CHEST  2 VIEW COMPARISON:  PA and lateral chest 01/18/2017 and 11/14/2011. FINDINGS: There is cardiomegaly without edema. No consolidative process, pneumothorax or effusion. Aortic atherosclerosis is noted. The patient is status post CABG. IMPRESSION: Cardiomegaly without acute disease. Electronically Signed   By: Inge Rise M.D.   On: 01/19/2017 17:06   Dg Chest 2 View  Result Date: 01/18/2017 CLINICAL DATA:  Cough for several days EXAM: CHEST  2 VIEW COMPARISON:  01/14/2011 FINDINGS: Cardiomediastinal silhouette is  stable. No infiltrate or pleural effusion. No pulmonary edema. Status post CABG. Bony thorax is unremarkable. IMPRESSION: No active cardiopulmonary disease. Electronically Signed   By: Lahoma Crocker M.D.   On: 01/18/2017 12:16   Ct Head Wo Contrast  Result Date: 01/19/2017 CLINICAL DATA:  states he has been treated for sinus infection-finished antibiotic-states increase in Parkinson's symptoms-states decrease in cognitive abilities-states he was ambulating with walker until Wednesday-states went to PCP but they sent him here-wife states increased urination and incontinence-was complaining of back pain earlier-wife thinks infection is causing symptoms-history  of dementia EXAM: CT HEAD WITHOUT CONTRAST TECHNIQUE: Contiguous axial images were obtained from the base of the skull through the vertex without intravenous contrast. COMPARISON:  None. FINDINGS: Brain: The ventricles are normal in configuration. There is ventricular enlargement, mostly of the atrium of the lateral ventricles, as well as sulcal enlargement, reflecting mild to moderate diffuse atrophy. No hydrocephalus. There are no parenchymal masses or mass effect. There is no evidence of a recent cortical infarct. Mild periventricular white matter hypoattenuation is noted consistent with chronic microvascular ischemic change. There are no extra-axial masses or abnormal fluid collections. There is no intracranial hemorrhage. Vascular: Vessels of the skullbase are prominent. There is no hyperdense vessel to suggest thrombosis. Skull: No fracture or skull lesion. Sinuses/Orbits: There is dependent fluid in the left maxillary sinus where there is also moderate mucosal thickening. Moderate mucosal thickening lines the left ethmoid air cells with mild inferior left frontal sinus mucosal thickening. Remaining sinuses are clear. Clear mastoid air cells. Globes and orbits are unremarkable. Other: None. IMPRESSION: 1. No acute intracranial abnormalities. 2. Atrophy  and mild chronic microvascular ischemic change. 3. Findings are consistent with acute sinusitis with a left maxillary sinus fluid level and left ethmoid, maxillary and inferior left frontal sinus mucosal thickening. Electronically Signed   By: Lajean Manes M.D.   On: 01/19/2017 18:11   Ct Chest Wo Contrast  Result Date: 01/19/2017 CLINICAL DATA:  Back pain shortness of breath and fever EXAM: CT CHEST WITHOUT CONTRAST TECHNIQUE: Multidetector CT imaging of the chest was performed following the standard protocol without IV contrast. COMPARISON:  Chest x-ray 01/19/2017, CT chest 02/23/2010 FINDINGS: Cardiovascular: Limited evaluation without contrast. Post CABG changes. Mild aneurysmal dilatation of the ascending aorta, measuring up to 4.2 cm. Coronary artery calcifications. Cardiomegaly. No pericardial effusion. Atherosclerotic vascular calcifications. Mediastinum/Nodes: Calcified left hilar and sub- carinal lymph nodes. Trachea midline. Thyroid grossly unremarkable. Esophagus within normal limits. Lungs/Pleura: Lungs are clear. No pleural effusion or pneumothorax. Upper Abdomen: Calcified granuloma in the spleen. Multiple hypodense hepatic lesions, many of which are too small to further characterize. Simple cyst adjacent to the gallbladder fossa partially visualized. Partial calcification in anterior left lobe lesion probably granuloma. Musculoskeletal: Old left fourth and rib fractures. No acute or suspicious bone lesion. IMPRESSION: 1. Clear lung fields 2. 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 Recommend annual imaging followup by CTA or MRA. This recommendation follows 2010 ACCF/AHA/AATS/ACR/ASA/SCA/SCAI/SIR/STS/SVM Guidelines for the Diagnosis and Management of Patients with Thoracic Aortic Disease. Circulation. 2010; 121: [id number] 3. Calcified mediastinal and hilar lymph nodes consistent with prior granulomatous disease. 4. Multiple hypodense hepatic lesions, too small to further  characterize, some have features consistent with cysts. Electronically Signed   By: Donavan Foil M.D.   On: 01/19/2017 23:08    Scheduled Meds: . amLODipine  10 mg Oral Daily  . carbidopa-levodopa  1 tablet Oral 4 times per day  . cholecalciferol  2,000 Units Oral Daily  . donepezil  10 mg Oral QHS  . fluticasone  2 spray Each Nare Daily  . guaiFENesin  1,200 mg Oral BID  . insulin aspart  0-15 Units Subcutaneous TID WC  . insulin aspart  0-5 Units Subcutaneous QHS  . levofloxacin  750 mg Oral Daily  . levothyroxine  125 mcg Oral Daily  . lisinopril  40 mg Oral Daily  . memantine  21 mg Oral Daily  . nebivolol  5 mg Oral Daily  . oseltamivir  75 mg Oral BID  .  rosuvastatin  5 mg Oral Daily  . sodium chloride flush  3 mL Intravenous Q12H  . warfarin  7.5 mg Oral Once  . Warfarin - Pharmacist Dosing Inpatient   Does not apply q1800      Time spent: 25 min  [facility] Pager [phone]. If 7PM-7AM, please contact night-coverage at [email or web address], Office  [phone]  password [email or web address] 01/20/2017, 10:39 AM  LOS: 0 days

## 2017-01-21 LAB — BASIC METABOLIC PANEL
Anion gap: 6 (ref 5–15)
BUN: 15 mg/dL (ref 6–20)
CALCIUM: 8.8 mg/dL — AB (ref 8.9–10.3)
CO2: 28 mmol/L (ref 22–32)
CREATININE: 0.84 mg/dL (ref 0.61–1.24)
Chloride: 105 mmol/L (ref 101–111)
GFR calc Af Amer: >60 mL/min (ref >60)
GFR calc non Af Amer: >60 mL/min (ref >60)
GLUCOSE: 104 mg/dL — AB (ref 65–99)
Potassium: 3.8 mmol/L (ref 3.5–5.1)
Sodium: 139 mmol/L (ref 135–145)

## 2017-01-21 LAB — PROTIME-INR
INR: 1.46
PROTHROMBIN TIME: 17.9 s — AB (ref 11.4–15.2)

## 2017-01-21 LAB — URINE CULTURE
CULTURE: NO GROWTH
SPECIAL REQUESTS: NORMAL

## 2017-01-21 LAB — GLUCOSE, CAPILLARY
Glucose-Capillary: 140 mg/dL — ABNORMAL HIGH (ref 65–99)
Glucose-Capillary: 107 mg/dL — ABNORMAL HIGH (ref 65–99)

## 2017-01-21 MED ORDER — GUAIFENESIN ER 600 MG PO TB12: 1200.0000 mg | ORAL_TABLET | Freq: Two times a day (BID) | ORAL | 0 refills | Status: AC

## 2017-01-21 MED ORDER — LEVOFLOXACIN 750 MG PO TABS: 750.0000 mg | ORAL_TABLET | Freq: Every day | ORAL | 0 refills | Status: DC

## 2017-01-21 MED ORDER — OSELTAMIVIR PHOSPHATE 75 MG PO CAPS: 75.0000 mg | ORAL_CAPSULE | Freq: Two times a day (BID) | ORAL | 0 refills | Status: DC

## 2017-01-21 MED ORDER — WARFARIN SODIUM 5 MG PO TABS: 7.5000 mg | ORAL_TABLET | Freq: Once | ORAL | Status: DC

## 2017-01-21 NOTE — Care Management Note (Signed)
Case Management Note  Patient Details  Name: Troy [name] MRN: [id number] Date of Birth: 1940/12/11  Subjective/Objective:                    Action/Plan:   Expected Discharge Date:  01/21/17               Expected Discharge Plan:  [place]  In-House Referral:     Discharge planning Services     Post Acute Care Choice:    Choice offered to:     DME Arranged:    DME Agency:     HH Arranged:  PT [place]:  [facility] (now [facility])  Status of Service:  In process, will continue to follow  If discussed at Long Length of Stay Meetings, dates discussed:    Additional CommentsPurcell Mouton, RN 01/21/2017, 2:57 PM

## 2017-01-21 NOTE — Telephone Encounter (Signed)
Please call wife back: Some days can be worse thak others in PD Patients, and reasons include everything from overexertion after exercise, sleep deprivation, suboptimal nutrition, anxiety, dehydration, acute illness such as cold or flulike illness or stomach virus. He may benefit from having a walker of his own. I would be happy to place an order for a rolling walker if she would like to have him try it, he may need to be fitted for the right kind and size, we can send to a DME of their choice.

## 2017-01-21 NOTE — Progress Notes (Signed)
Physical Therapy Treatment Patient Details Name: Troy [name] MRN: [id number] DOB: July 08, 1941 Today's Date: 01/21/2017    History of Present Illness Pt admitted with AMS and weakness and with hx of A-fib, DM, CAD, CKD, AAA with repair, CABG and Parkinsons    PT Comments    Pt progressing with mobility with noted improvement in stability and endurance.  Pt maintained SaO2 at 94% or higher on RA throughout session - RN aware.   Follow Up Recommendations  Home health PT     Equipment Recommendations  None recommended by PT    Recommendations for Other Services       Precautions / Restrictions Precautions Precautions: Fall Precaution Comments: Short term memory deficits Restrictions Weight Bearing Restrictions: No    Mobility  Bed Mobility               General bed mobility comments: Pt up in chair with nursing and requests back to chair  Transfers Overall transfer level: Needs assistance Equipment used: Rolling walker (2 wheeled) Transfers: Sit to/from Stand Sit to Stand: Min guard         General transfer comment: cues transition position and  use of UEs to self assist.  Ambulation/Gait Ambulation/Gait assistance: Min assist Ambulation Distance (Feet): 200 Feet (150' with RW and 22' with HHA) Assistive device: Rolling walker (2 wheeled);1 person hand held assist Gait Pattern/deviations: Step-through pattern;Decreased step length - right;Decreased step length - left;Shuffle;Trunk flexed     General Gait Details: cues for posture, position from RW and to decrease pace for saftey/balance,     Stairs            Wheelchair Mobility    Modified Rankin (Stroke Patients Only)       Balance Overall balance assessment: Needs assistance Sitting-balance support: No upper extremity supported;Feet supported Sitting balance-Leahy Scale: Good     Standing balance support: No upper extremity supported Standing balance-Leahy Scale: Fair                      Cognition Arousal/Alertness: Awake/alert Behavior During Therapy: WFL for tasks assessed/performed Overall Cognitive Status: History of cognitive impairments - at baseline                 General Comments: Occasional cues repeated/reworded for pt understanding and follow through    Exercises      General Comments        Pertinent Vitals/Pain Pain Assessment: No/denies pain    Home Living                      Prior Function            PT Goals (current goals can now be found in the care plan section) Acute Rehab PT Goals Patient Stated Goal: Get moving PT Goal Formulation: With patient Time For Goal Achievement: 02/02/17 Potential to Achieve Goals: Fair Progress towards PT goals: Progressing toward goals    Frequency    Min 3X/week      PT Plan Current plan remains appropriate    Co-evaluation             End of Session Equipment Utilized During Treatment: Gait belt Activity Tolerance: Patient tolerated treatment well Patient left: in chair;with call bell/phone within reach;with family/visitor present Nurse Communication: Mobility status PT Visit Diagnosis: Unsteadiness on feet (R26.81);Difficulty in walking, not elsewhere classified (R26.2)     Time: 1205-1228 PT Time Calculation (min) (ACUTE ONLY): 23 min  Charges:  $  Gait Training: 23-37 mins                    G Codes:       Troy [name] 02/19/2017, 12:56 PM

## 2017-01-21 NOTE — Progress Notes (Signed)
Date: January 21, 2017 Discharge orders checked for needs. Information for home PT faxed to [facility]. Velva Harman, RN, BSN, [place]   [phone]

## 2017-01-21 NOTE — Progress Notes (Signed)
[facility] for Warfarin Indication: atrial fibrillation  Allergies  Allergen Reactions  . Bee Venom Anaphylaxis  . Other Anaphylaxis    Other reaction(s): Laryngeal Edema (ALLERGY) "a heart medication" ? unknown  . Tamsulosin Other (See Comments)    Insomnia  . Penicillins Rash  . Tetanus Toxoids Rash    Patient Measurements: Height: 5\' 7"  (170.2 cm) Weight: 190 lb (86.2 kg) IBW/kg (Calculated) : 66.1   Vital Signs: Temp: 97.4 F (36.3 C) (02/26 0458) Temp Source: Oral (02/26 0458) BP: 126/88 (02/26 0458) Pulse Rate: 54 (02/25 2024)  Labs:  Recent Labs  01/19/17 1528 01/20/17 0022 01/20/17 0523 01/20/17 1207 01/21/17 0520  HGB 14.2  --  17.7*  --   --   HCT 42.1  --  50.5  --   --   PLT 121*  --  76*  --   --   LABPROT  --  18.2*  --   --  17.9*  INR  --  1.49  --   --  1.46  CREATININE 0.92  --  0.79  --  0.84  TROPONINI  --  <0.03 <0.03 <0.03  --     Estimated Creatinine Clearance: 77.2 mL/min (by C-G formula based on SCr of 0.84 mg/dL).   Medical History: Past Medical History:  Diagnosis Date  . AAA (abdominal aortic aneurysm) ([place])    with mural thrombus  . Chronic anticoagulation   . CKD (chronic kidney disease) stage 2, GFR 60-89 ml/min   . Constipation   . Coronary artery disease 07/2009   severe 3 vessel ASCAD s/p CABG with LIMA to LAD, SVG to diag, SVG to PL  . Dementia   . Diabetes mellitus   . Diverticulosis   . Facial nerve spasm    L eye, tx'd with botox  . Fecal impaction ([place])   . Gout   . Hepatic steatosis   . Hyperlipidemia   . Hypertension   . Hypothyroidism   . OSA (obstructive sleep apnea)    intolerant to CPAP followed by Dr. Lamonte Sakai  . Parkinsonism ([place])   . Persistent atrial fibrillation ([facility])    PTA warfarin dose: 5mg  daily except 7.5 mg on Tuesdays.  Last dose 2/23. INR has been therapeutic on this dose >6 months per [facility] clinic records.    Assessment: 10 yoM with HX Afib on  warfarin PTA. INR=1.49 on admission.   01/21/2017:  INR still sub-therapeutic today, slightly decreased from admission @1 .46 but no dose given 2/24   CBC- Hg stable, no bleeding noted.  Pltc <100- continue to monitor.   Heart healthy/ CHL modified diet ordered- eating 100%  Major drug-drug interactions: on Levaquin which can increase INR  Goal of Therapy:  INR 2-3   Plan:  Repeat 7.5 mg Warfarin x1 today Consider repeat CBC in am & monitor Pltc trend Daily PT/INR  Netta Cedars, PharmD, BCPS Pager: [phone] 01/21/2017,7:47 AM

## 2017-01-21 NOTE — Discharge Summary (Signed)
Physician Discharge Summary  Troy [name] [id number] DOB: Aug 01, 1941 DOA: 01/19/2017  PCP: Lilian Coma, MD  Admit date: 01/19/2017 Discharge date: 01/21/2017  Time spent: 25* minutes  Recommendations for Outpatient Follow-up:  1. Patient to go home with HHPT 2. Follow up PCP in 2 weeks 3. Take coumadin 7.5 mg tonight and tomorrow, and start 5 mg daily from 01/23/2017 4. Check PT/INR in 3 days 5. Continue Levaquin 750 mg by mouth daily for 5 more days 6. Continue Tamiflu 75 mg by mouth twice a day for 3 days   Discharge Diagnoses:  Active Problems:   Persistent atrial fibrillation (HCC)   Hypertension   Parkinsonism ([facility])   Altered mental status   Discharge Condition: Stable  Diet recommendation: Heart healthy diet  Filed Weights   01/19/17 1752  Weight: 86.2 kg (190 lb)    History of present illness:  76 y.o.malewith a known history of CKD, CAD s/p CABG, DM, HTN, HLD, hypothyroidism, OSA, parkinson's, dementia, PAF on coumadin, AAA with mural thrombus was in a usual state of health until two days ago when his wife noticed a sudden change in his alertness, worsening weakness and gait instability. He was diagnosed with and treated for sinusitis, completed 10 day course of doxy 4 days ago. His nasal and sinus congestion progressed to include chest congestion with productive cough.   Of note, he was seen by neuro and meds were recently changed (clonazepam was decreased two weeks ago). Otherwise there has been no change in status. Patient has been taking medication as prescribed and there has been no recent change in medication or diet. There has been no recent hospitalizations, travel or sick contacts.    Hospital Course:  1. Altered mental status- currently at baseline, likely exacerbated by influenza B infection as well as recurrent maxillary sinusitis. 2. Upper respiratory infection, influenza B+- started on Tamiflu, Will discharged on Tamiflu for 3 more  days. No shortness of breath. Upper respiratory symptoms have resolved. 3. Acute recurrent maxillary sinusitis- patient recently completed course of doxycycline, started on Levaquin.Patient has improved, will continue Levaquin 750 mg by mouth daily for 5 more days along with Mucinex 1200 mg by mouth twice a day for 5 more days. 4. Hypokalemia-Replete, today potassium is 3.8 5. History of Parkinson disease- continue Sinemet, at baseline 6. History of dementia- no behavior disturbance at this time, continue Aricept, Namenda 7. History of paroxysmal atrial fibrillation-heart rate is controlled, continue Bystolic, Today INR is [id number], recommend taking 7.5 mg by mouth tonight and tomorrow and then start taking 5 mg by mouth daily from 01/23/2017. Check PT/INR in 3 days. 8. History of hypothyroidism- TSH 0.718, Continue Synthroid. 9. Diabetes mellitus- Patient is not on medication for diabetes.Blood glucose has been stable in the 100s in the hospital 10. Hypertension-blood pressure stable, continue amlodipine, Bystolic, lisinopril.  Procedures:  None   Consultations:  None   Discharge Exam: Vitals:   01/21/17 0458 01/21/17 1433  BP: 126/88 128/75  Pulse:  (!) 52  Resp: 17 18  Temp: 97.4 F (36.3 C) 98.6 F (37 C)    General: Appears in no acute distress Cardiovascular: RRR, S1-S2 Respiratory: Clear to auscultation bilaterally  Discharge Instructions   Discharge Instructions    Diet - low sodium heart healthy    Complete by:  As directed    Discharge instructions    Complete by:  As directed    Take Coumadin 7.5 mg po tonight and tomorrow, asd continue with 5 mg daily  from  01/23/17   Increase activity slowly    Complete by:  As directed      Current Discharge Medication List    START taking these medications   Details  guaiFENesin (MUCINEX) 600 MG 12 hr tablet Take 2 tablets (1,200 mg total) by mouth 2 (two) times daily. Qty: 10 tablet, Refills: 0    levofloxacin  (LEVAQUIN) 750 MG tablet Take 1 tablet (750 mg total) by mouth daily. Qty: 5 tablet, Refills: 0    oseltamivir (TAMIFLU) 75 MG capsule Take 1 capsule (75 mg total) by mouth 2 (two) times daily. Qty: 6 capsule, Refills: 0      CONTINUE these medications which have NOT CHANGED   Details  amLODipine (NORVASC) 10 MG tablet TAKE ONE TABLET BY MOUTH ONCE DAILY Qty: 90 tablet, Refills: 3    BYSTOLIC 5 MG tablet Take 5 mg by mouth daily.    carbidopa-levodopa (SINEMET CR) 50-200 MG tablet Take 1 tablet by mouth 4 (four) times daily. 7, 11, 3 pm and 7 pm Qty: 360 tablet, Refills: 3   Associated Diagnoses: Primary Parkinsonism (HCC)    carboxymethylcellulose (REFRESH PLUS) 0.5 % SOLN Place 1 drop into both eyes 3 (three) times daily as needed (dry eyes).    Cholecalciferol (VITAMIN D-3) 1000 UNITS CAPS Take 2,000 Units by mouth daily.     Cinnamon 500 MG capsule Take 1,000 mg by mouth daily.     clonazePAM (KLONOPIN) 0.25 MG disintegrating tablet Take 2 tablets (0.5 mg total) by mouth at bedtime. Qty: 60 tablet, Refills: 5    DHA-EPA-Vitamin E [id number] MG-MG-UNIT CAPS Take 1 capsule by mouth 2 (two) times daily as needed (dry eyes).     donepezil (ARICEPT) 10 MG tablet Take 1 tablet (10 mg total) by mouth at bedtime. Qty: 90 tablet, Refills: 3   Associated Diagnoses: Primary Parkinsonism (HCC)    fluticasone (FLONASE) 50 MCG/ACT nasal spray Place 2 sprays into the nose daily as needed for allergies.     levothyroxine (SYNTHROID, LEVOTHROID) 125 MCG tablet Take 125 mcg by mouth daily.      lisinopril (PRINIVIL,ZESTRIL) 40 MG tablet TAKE ONE TABLET BY MOUTH ONCE DAILY Qty: 90 tablet, Refills: 1    NAMENDA XR 21 MG CP24 Take 21 mg by mouth daily.    !! OVER THE COUNTER MEDICATION Take 1 tablet by mouth 2 (two) times daily. Macular degeneration supplement    !! OVER THE COUNTER MEDICATION Take 1 capsule by mouth daily. Homocysteine supplement    rosuvastatin (CRESTOR) 5 MG tablet  Take 1 tablet (5 mg total) by mouth daily. Qty: 30 tablet, Refills: 11    warfarin (COUMADIN) 5 MG tablet TAKE ONE TABLET BY MOUTH ONCE DAILY AS DIRECTED Qty: 40 tablet, Refills: 3    EPIPEN 2-PAK 0.3 MG/0.3ML SOAJ Inject 0.3 mg into the muscle once as needed. Allergic reaction    metoprolol tartrate (LOPRESSOR) 25 MG tablet Take 1 tablet (25 mg total) by mouth 2 (two) times daily. Qty: 180 tablet, Refills: 3     !! - Potential duplicate medications found. Please discuss with provider.    STOP taking these medications     clindamycin (CLEOCIN) 150 MG capsule        Allergies  Allergen Reactions  . Bee Venom Anaphylaxis  . Other Anaphylaxis    Other reaction(s): Laryngeal Edema (ALLERGY) "a heart medication" ? unknown  . Tamsulosin Other (See Comments)    Insomnia  . Penicillins Rash  . Tetanus Toxoids Rash  The results of significant diagnostics from this hospitalization (including imaging, microbiology, ancillary and laboratory) are listed below for reference.    Significant Diagnostic Studies: Dg Chest 2 View  Result Date: 01/19/2017 CLINICAL DATA:  Increased urinary frequency and incontinence. Back pain. EXAM: CHEST  2 VIEW COMPARISON:  PA and lateral chest 01/18/2017 and 11/14/2011. FINDINGS: There is cardiomegaly without edema. No consolidative process, pneumothorax or effusion. Aortic atherosclerosis is noted. The patient is status post CABG. IMPRESSION: Cardiomegaly without acute disease. Electronically Signed   By: Inge Rise M.D.   On: 01/19/2017 17:06   Dg Chest 2 View  Result Date: 01/18/2017 CLINICAL DATA:  Cough for several days EXAM: CHEST  2 VIEW COMPARISON:  01/14/2011 FINDINGS: Cardiomediastinal silhouette is stable. No infiltrate or pleural effusion. No pulmonary edema. Status post CABG. Bony thorax is unremarkable. IMPRESSION: No active cardiopulmonary disease. Electronically Signed   By: Lahoma Crocker M.D.   On: 01/18/2017 12:16   Ct Head Wo  Contrast  Result Date: 01/19/2017 CLINICAL DATA:  states he has been treated for sinus infection-finished antibiotic-states increase in Parkinson's symptoms-states decrease in cognitive abilities-states he was ambulating with walker until Wednesday-states went to PCP but they sent him here-wife states increased urination and incontinence-was complaining of back pain earlier-wife thinks infection is causing symptoms-history of dementia EXAM: CT HEAD WITHOUT CONTRAST TECHNIQUE: Contiguous axial images were obtained from the base of the skull through the vertex without intravenous contrast. COMPARISON:  None. FINDINGS: Brain: The ventricles are normal in configuration. There is ventricular enlargement, mostly of the atrium of the lateral ventricles, as well as sulcal enlargement, reflecting mild to moderate diffuse atrophy. No hydrocephalus. There are no parenchymal masses or mass effect. There is no evidence of a recent cortical infarct. Mild periventricular white matter hypoattenuation is noted consistent with chronic microvascular ischemic change. There are no extra-axial masses or abnormal fluid collections. There is no intracranial hemorrhage. Vascular: Vessels of the skullbase are prominent. There is no hyperdense vessel to suggest thrombosis. Skull: No fracture or skull lesion. Sinuses/Orbits: There is dependent fluid in the left maxillary sinus where there is also moderate mucosal thickening. Moderate mucosal thickening lines the left ethmoid air cells with mild inferior left frontal sinus mucosal thickening. Remaining sinuses are clear. Clear mastoid air cells. Globes and orbits are unremarkable. Other: None. IMPRESSION: 1. No acute intracranial abnormalities. 2. Atrophy and mild chronic microvascular ischemic change. 3. Findings are consistent with acute sinusitis with a left maxillary sinus fluid level and left ethmoid, maxillary and inferior left frontal sinus mucosal thickening. Electronically Signed    By: Lajean Manes M.D.   On: 01/19/2017 18:11   Ct Chest Wo Contrast  Result Date: 01/19/2017 CLINICAL DATA:  Back pain shortness of breath and fever EXAM: CT CHEST WITHOUT CONTRAST TECHNIQUE: Multidetector CT imaging of the chest was performed following the standard protocol without IV contrast. COMPARISON:  Chest x-ray 01/19/2017, CT chest 02/23/2010 FINDINGS: Cardiovascular: Limited evaluation without contrast. Post CABG changes. Mild aneurysmal dilatation of the ascending aorta, measuring up to 4.2 cm. Coronary artery calcifications. Cardiomegaly. No pericardial effusion. Atherosclerotic vascular calcifications. Mediastinum/Nodes: Calcified left hilar and sub- carinal lymph nodes. Trachea midline. Thyroid grossly unremarkable. Esophagus within normal limits. Lungs/Pleura: Lungs are clear. No pleural effusion or pneumothorax. Upper Abdomen: Calcified granuloma in the spleen. Multiple hypodense hepatic lesions, many of which are too small to further characterize. Simple cyst adjacent to the gallbladder fossa partially visualized. Partial calcification in anterior left lobe lesion probably granuloma. Musculoskeletal: Old left  fourth and rib fractures. No acute or suspicious bone lesion. IMPRESSION: 1. Clear lung fields 2. 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 Recommend annual imaging followup by CTA or MRA. This recommendation follows 2010 ACCF/AHA/AATS/ACR/ASA/SCA/SCAI/SIR/STS/SVM Guidelines for the Diagnosis and Management of Patients with Thoracic Aortic Disease. Circulation. 2010; 121: [id number] 3. Calcified mediastinal and hilar lymph nodes consistent with prior granulomatous disease. 4. Multiple hypodense hepatic lesions, too small to further characterize, some have features consistent with cysts. Electronically Signed   By: Donavan Foil M.D.   On: 01/19/2017 23:08    Microbiology: Recent Results (from the past 240 hour(s))  Urine culture     Status: None   Collection  Time: 01/19/17  5:50 PM  Result Value Ref Range Status   Specimen Description URINE, CLEAN CATCH  Final   Special Requests Normal  Final   Culture   Final    NO GROWTH Performed at [facility], [address], [place], [place] [address]    Report Status 01/21/2017 FINAL  Final  MRSA PCR Screening     Status: None   Collection Time: 01/20/17  2:15 AM  Result Value Ref Range Status   MRSA by PCR NEGATIVE NEGATIVE Final    Comment:        The GeneXpert MRSA Assay (FDA approved for NASAL specimens only), is one component of a comprehensive MRSA colonization surveillance program. It is not intended to diagnose MRSA infection nor to guide or monitor treatment for MRSA infections.      Labs: Basic Metabolic Panel:  Recent Labs Lab 01/19/17 1528 01/20/17 0022 01/20/17 0523 01/21/17 0520  NA 138  --  138 139  K 3.7  --  3.4* 3.8  CL 103  --  105 105  CO2 28  --  28 28  GLUCOSE 110*  --  134* 104*  BUN 17  --  15 15  CREATININE 0.92  --  0.79 0.84  CALCIUM 9.3  --  9.0 8.8*  MG  --  1.9  --   --   PHOS  --  3.1  --   --    Liver Function Tests: No results for input(s): AST, ALT, ALKPHOS, BILITOT, PROT, ALBUMIN in the last 168 hours. No results for input(s): LIPASE, AMYLASE in the last 168 hours. No results for input(s): AMMONIA in the last 168 hours. CBC:  Recent Labs Lab 01/19/17 1528 01/20/17 0523  WBC 5.3 3.2*  HGB 14.2 17.7*  HCT 42.1 50.5  MCV 89.0 88.9  PLT 121* 76*   Cardiac Enzymes:  Recent Labs Lab 01/20/17 0022 01/20/17 0523 01/20/17 1207  TROPONINI <0.03 <0.03 <0.03   BNP:  CBG:  Recent Labs Lab 01/20/17 1118 01/20/17 1721 01/20/17 2028 01/21/17 0747 01/21/17 1136  GLUCAP 87 102* 106* 140* 107*       Signed:  Eleonore Chiquito S MD.  [facility] 01/21/2017, 2:46 PM

## 2017-01-22 DIAGNOSIS — Z7901 Long term (current) use of anticoagulants: Secondary | ICD-10-CM | POA: Diagnosis not present

## 2017-01-22 DIAGNOSIS — G20 Parkinson's disease: Secondary | ICD-10-CM | POA: Diagnosis not present

## 2017-01-22 DIAGNOSIS — Z9181 History of falling: Secondary | ICD-10-CM | POA: Diagnosis not present

## 2017-01-22 NOTE — Telephone Encounter (Signed)
I spoke to pt's wife and advised her of this information. Pt's wife actually reports that pt was in the hospital for a sinus infections and the flu this past weekend. However, now that this has been treated adequately, pt is more alert and able to walk. Pt's wife will call us back with any further questions or concerns.

## 2017-01-24 DIAGNOSIS — Z9181 History of falling: Secondary | ICD-10-CM | POA: Diagnosis not present

## 2017-01-24 DIAGNOSIS — Z7901 Long term (current) use of anticoagulants: Secondary | ICD-10-CM | POA: Diagnosis not present

## 2017-01-24 DIAGNOSIS — G20 Parkinson's disease: Secondary | ICD-10-CM | POA: Diagnosis not present

## 2017-01-24 DIAGNOSIS — D696 Thrombocytopenia, unspecified: Secondary | ICD-10-CM | POA: Diagnosis not present

## 2017-01-24 DIAGNOSIS — J329 Chronic sinusitis, unspecified: Secondary | ICD-10-CM | POA: Diagnosis not present

## 2017-01-24 DIAGNOSIS — J101 Influenza due to other identified influenza virus with other respiratory manifestations: Secondary | ICD-10-CM | POA: Diagnosis not present

## 2017-01-24 LAB — PROTIME-INR: INR: 2.2 — AB (ref 0.9–1.1)

## 2017-01-25 ENCOUNTER — Ambulatory Visit (INDEPENDENT_AMBULATORY_CARE_PROVIDER_SITE_OTHER): Payer: Medicare Other | Admitting: Internal Medicine

## 2017-01-25 DIAGNOSIS — Z5181 Encounter for therapeutic drug level monitoring: Secondary | ICD-10-CM

## 2017-01-25 DIAGNOSIS — I4819 Other persistent atrial fibrillation: Secondary | ICD-10-CM

## 2017-01-25 DIAGNOSIS — I481 Persistent atrial fibrillation: Secondary | ICD-10-CM

## 2017-01-26 ENCOUNTER — Other Ambulatory Visit: Payer: Self-pay | Admitting: Cardiology

## 2017-01-28 ENCOUNTER — Other Ambulatory Visit: Payer: Self-pay | Admitting: *Deleted

## 2017-01-28 DIAGNOSIS — G20 Parkinson's disease: Secondary | ICD-10-CM | POA: Diagnosis not present

## 2017-01-28 DIAGNOSIS — Z7901 Long term (current) use of anticoagulants: Secondary | ICD-10-CM | POA: Diagnosis not present

## 2017-01-28 DIAGNOSIS — Z9181 History of falling: Secondary | ICD-10-CM | POA: Diagnosis not present

## 2017-01-29 DIAGNOSIS — G20 Parkinson's disease: Secondary | ICD-10-CM | POA: Diagnosis not present

## 2017-01-29 DIAGNOSIS — Z7901 Long term (current) use of anticoagulants: Secondary | ICD-10-CM | POA: Diagnosis not present

## 2017-01-29 DIAGNOSIS — Z9181 History of falling: Secondary | ICD-10-CM | POA: Diagnosis not present

## 2017-01-31 DIAGNOSIS — Z9181 History of falling: Secondary | ICD-10-CM | POA: Diagnosis not present

## 2017-01-31 DIAGNOSIS — G20 Parkinson's disease: Secondary | ICD-10-CM | POA: Diagnosis not present

## 2017-01-31 DIAGNOSIS — Z7901 Long term (current) use of anticoagulants: Secondary | ICD-10-CM | POA: Diagnosis not present

## 2017-02-04 DIAGNOSIS — G20 Parkinson's disease: Secondary | ICD-10-CM | POA: Diagnosis not present

## 2017-02-04 DIAGNOSIS — Z7901 Long term (current) use of anticoagulants: Secondary | ICD-10-CM | POA: Diagnosis not present

## 2017-02-04 DIAGNOSIS — Z9181 History of falling: Secondary | ICD-10-CM | POA: Diagnosis not present

## 2017-02-05 ENCOUNTER — Telehealth: Payer: Self-pay | Admitting: Cardiology

## 2017-02-05 ENCOUNTER — Other Ambulatory Visit: Payer: Self-pay | Admitting: *Deleted

## 2017-02-05 DIAGNOSIS — Z7901 Long term (current) use of anticoagulants: Secondary | ICD-10-CM | POA: Diagnosis not present

## 2017-02-05 DIAGNOSIS — Z9181 History of falling: Secondary | ICD-10-CM | POA: Diagnosis not present

## 2017-02-05 DIAGNOSIS — G20 Parkinson's disease: Secondary | ICD-10-CM | POA: Diagnosis not present

## 2017-02-05 MED ORDER — ROSUVASTATIN CALCIUM 5 MG PO TABS: 5.0000 mg | ORAL_TABLET | Freq: Every day | ORAL | 3 refills | Status: DC

## 2017-02-05 NOTE — Telephone Encounter (Signed)
Patient's wife called to report the patient started his Metoprolol this weekend.  He was prescribed the medication back in January but finished Bystolic before starting. His wife reports he has been extremely tired and sleeping an abnormally large amount since starting the medication Saturday. He is fatigued and falls asleep at random times. She also reports some memory lapses since starting She states he has the same problem with Toprol in the past as well. She reports no other symptoms other than extreme fatigue and some mild memory loss. BP today: 129/86 HR today: 52 She would like medication recommendations from Dr. Radford Pax.

## 2017-02-05 NOTE — Telephone Encounter (Signed)
Patient wife is calling, states that patient is having an issue with his metoprolol tartrate, 25 mg medication. She states that patient started taking medication on Saturday and cannot stay awake during the day, "an abnormal amount of sleep." Please call, thanks.

## 2017-02-06 ENCOUNTER — Telehealth: Payer: Self-pay | Admitting: Neurology

## 2017-02-06 NOTE — Telephone Encounter (Signed)
Patient's wife states patient started medication metoprolol tartrate (LOPRESSOR) 25 MG tablet for his heart rhythm. Since then he has excessive sleepiness, confusion and less steady walking. He was hospitalized on 01-20-17,  discharged on 01-11-17 and diagnosed with flu and sinus infection. Please call to discuss.

## 2017-02-07 ENCOUNTER — Ambulatory Visit (INDEPENDENT_AMBULATORY_CARE_PROVIDER_SITE_OTHER): Payer: Medicare Other | Admitting: Pharmacist

## 2017-02-07 DIAGNOSIS — I4891 Unspecified atrial fibrillation: Secondary | ICD-10-CM

## 2017-02-07 DIAGNOSIS — I4819 Other persistent atrial fibrillation: Secondary | ICD-10-CM

## 2017-02-07 DIAGNOSIS — Z5181 Encounter for therapeutic drug level monitoring: Secondary | ICD-10-CM

## 2017-02-07 DIAGNOSIS — I2510 Atherosclerotic heart disease of native coronary artery without angina pectoris: Secondary | ICD-10-CM

## 2017-02-07 DIAGNOSIS — I481 Persistent atrial fibrillation: Secondary | ICD-10-CM | POA: Diagnosis not present

## 2017-02-07 LAB — POCT INR: INR: 2.7

## 2017-02-07 NOTE — Telephone Encounter (Signed)
Decrease metoprolol to 12.5 mg BID for 2 days, then 12.5mg  daily for 2 days, then QOD for 4 days then stop.  Check BP and HR daily for a week after off metoprolol and monitor for 1 week and call with results

## 2017-02-07 NOTE — Patient Instructions (Signed)
Decrease metoprolol to 12.5 mg twice daily for 2 days, then 12.5mg  daily for 2 days, then 12.5mg  every other day for 4 days then stop.

## 2017-02-07 NOTE — Telephone Encounter (Signed)
FYI patient was seen at Cardiology and they will wean the patient off of the metoprolol. Wife will call back If needed.

## 2017-02-07 NOTE — Telephone Encounter (Signed)
Reviewed with [facility] at [facility] today.  Med list updated.

## 2017-02-08 DIAGNOSIS — Z9181 History of falling: Secondary | ICD-10-CM | POA: Diagnosis not present

## 2017-02-08 DIAGNOSIS — Z7901 Long term (current) use of anticoagulants: Secondary | ICD-10-CM | POA: Diagnosis not present

## 2017-02-08 DIAGNOSIS — G20 Parkinson's disease: Secondary | ICD-10-CM | POA: Diagnosis not present

## 2017-02-18 DIAGNOSIS — G513 Clonic hemifacial spasm: Secondary | ICD-10-CM | POA: Diagnosis not present

## 2017-02-21 DIAGNOSIS — Z961 Presence of intraocular lens: Secondary | ICD-10-CM | POA: Diagnosis not present

## 2017-02-21 DIAGNOSIS — H02206 Unspecified lagophthalmos left eye, unspecified eyelid: Secondary | ICD-10-CM | POA: Diagnosis not present

## 2017-02-21 DIAGNOSIS — H40013 Open angle with borderline findings, low risk, bilateral: Secondary | ICD-10-CM | POA: Diagnosis not present

## 2017-02-21 DIAGNOSIS — H16212 Exposure keratoconjunctivitis, left eye: Secondary | ICD-10-CM | POA: Diagnosis not present

## 2017-02-28 ENCOUNTER — Ambulatory Visit (INDEPENDENT_AMBULATORY_CARE_PROVIDER_SITE_OTHER): Payer: Medicare Other | Admitting: Pharmacist

## 2017-02-28 DIAGNOSIS — I4819 Other persistent atrial fibrillation: Secondary | ICD-10-CM

## 2017-02-28 DIAGNOSIS — I4891 Unspecified atrial fibrillation: Secondary | ICD-10-CM | POA: Diagnosis not present

## 2017-02-28 DIAGNOSIS — I481 Persistent atrial fibrillation: Secondary | ICD-10-CM

## 2017-02-28 DIAGNOSIS — Z5181 Encounter for therapeutic drug level monitoring: Secondary | ICD-10-CM

## 2017-02-28 DIAGNOSIS — I2510 Atherosclerotic heart disease of native coronary artery without angina pectoris: Secondary | ICD-10-CM | POA: Diagnosis not present

## 2017-02-28 LAB — POCT INR: INR: 2.8

## 2017-04-10 ENCOUNTER — Other Ambulatory Visit: Payer: Self-pay | Admitting: Family Medicine

## 2017-04-10 ENCOUNTER — Ambulatory Visit
Admission: RE | Admit: 2017-04-10 | Discharge: 2017-04-10 | Disposition: A | Discharge status: Home / Self Care | Payer: Medicare Other | Source: Ambulatory Visit | Attending: Family Medicine | Admitting: Family Medicine

## 2017-04-10 DIAGNOSIS — M549 Dorsalgia, unspecified: Secondary | ICD-10-CM

## 2017-04-10 DIAGNOSIS — T300 Burn of unspecified body region, unspecified degree: Secondary | ICD-10-CM | POA: Diagnosis not present

## 2017-04-10 DIAGNOSIS — M545 Low back pain: Secondary | ICD-10-CM | POA: Diagnosis not present

## 2017-04-11 ENCOUNTER — Ambulatory Visit (INDEPENDENT_AMBULATORY_CARE_PROVIDER_SITE_OTHER): Payer: Medicare Other | Admitting: Pharmacist

## 2017-04-11 DIAGNOSIS — I4891 Unspecified atrial fibrillation: Secondary | ICD-10-CM

## 2017-04-11 DIAGNOSIS — I4819 Other persistent atrial fibrillation: Secondary | ICD-10-CM

## 2017-04-11 DIAGNOSIS — I2510 Atherosclerotic heart disease of native coronary artery without angina pectoris: Secondary | ICD-10-CM

## 2017-04-11 DIAGNOSIS — Z5181 Encounter for therapeutic drug level monitoring: Secondary | ICD-10-CM

## 2017-04-11 DIAGNOSIS — I481 Persistent atrial fibrillation: Secondary | ICD-10-CM

## 2017-04-11 LAB — POCT INR: INR: 2.1

## 2017-04-24 DIAGNOSIS — H6122 Impacted cerumen, left ear: Secondary | ICD-10-CM | POA: Diagnosis not present

## 2017-05-01 DIAGNOSIS — Z79899 Other long term (current) drug therapy: Secondary | ICD-10-CM | POA: Diagnosis not present

## 2017-05-01 DIAGNOSIS — Z6831 Body mass index (BMI) 31.0-31.9, adult: Secondary | ICD-10-CM | POA: Diagnosis not present

## 2017-05-01 DIAGNOSIS — E1122 Type 2 diabetes mellitus with diabetic chronic kidney disease: Secondary | ICD-10-CM | POA: Diagnosis not present

## 2017-05-01 DIAGNOSIS — I2510 Atherosclerotic heart disease of native coronary artery without angina pectoris: Secondary | ICD-10-CM | POA: Diagnosis not present

## 2017-05-01 DIAGNOSIS — G4733 Obstructive sleep apnea (adult) (pediatric): Secondary | ICD-10-CM | POA: Diagnosis not present

## 2017-05-01 DIAGNOSIS — E559 Vitamin D deficiency, unspecified: Secondary | ICD-10-CM | POA: Diagnosis not present

## 2017-05-01 DIAGNOSIS — I481 Persistent atrial fibrillation: Secondary | ICD-10-CM | POA: Diagnosis not present

## 2017-05-01 DIAGNOSIS — Z0000 Encounter for general adult medical examination without abnormal findings: Secondary | ICD-10-CM | POA: Diagnosis not present

## 2017-05-13 ENCOUNTER — Other Ambulatory Visit: Payer: Self-pay | Admitting: Cardiology

## 2017-05-13 NOTE — Telephone Encounter (Signed)
New message      *STAT* If patient is at the pharmacy, call can be transferred to refill team.   1. Which medications need to be refilled? (please list name of each medication and dose if known) warfarin 5mg ---New manufacturer  2. Which pharmacy/location (including street and city if local pharmacy) is medication to be sent to? [facility] [address]  3. Do they need a 30 day or 90 day supply? 30 day

## 2017-05-14 MED ORDER — WARFARIN SODIUM 5 MG PO TABS: ORAL_TABLET | ORAL | 3 refills | Status: DC

## 2017-05-14 NOTE — Telephone Encounter (Signed)
Refill sent to requested Pharmacy & aware that manufacturer has changed.

## 2017-05-20 DIAGNOSIS — G513 Clonic hemifacial spasm: Secondary | ICD-10-CM | POA: Diagnosis not present

## 2017-05-24 ENCOUNTER — Ambulatory Visit (INDEPENDENT_AMBULATORY_CARE_PROVIDER_SITE_OTHER): Payer: Medicare Other | Admitting: *Deleted

## 2017-05-24 DIAGNOSIS — I4891 Unspecified atrial fibrillation: Secondary | ICD-10-CM

## 2017-05-24 DIAGNOSIS — I4819 Other persistent atrial fibrillation: Secondary | ICD-10-CM

## 2017-05-24 DIAGNOSIS — Z5181 Encounter for therapeutic drug level monitoring: Secondary | ICD-10-CM | POA: Diagnosis not present

## 2017-05-24 DIAGNOSIS — I481 Persistent atrial fibrillation: Secondary | ICD-10-CM | POA: Diagnosis not present

## 2017-05-24 DIAGNOSIS — I2510 Atherosclerotic heart disease of native coronary artery without angina pectoris: Secondary | ICD-10-CM | POA: Diagnosis not present

## 2017-05-24 LAB — POCT INR: INR: 2.4

## 2017-05-30 ENCOUNTER — Telehealth: Payer: Self-pay | Admitting: *Deleted

## 2017-05-30 DIAGNOSIS — L570 Actinic keratosis: Secondary | ICD-10-CM | POA: Diagnosis not present

## 2017-05-30 DIAGNOSIS — D1801 Hemangioma of skin and subcutaneous tissue: Secondary | ICD-10-CM | POA: Diagnosis not present

## 2017-05-30 DIAGNOSIS — L814 Other melanin hyperpigmentation: Secondary | ICD-10-CM | POA: Diagnosis not present

## 2017-05-30 DIAGNOSIS — L821 Other seborrheic keratosis: Secondary | ICD-10-CM | POA: Diagnosis not present

## 2017-05-30 DIAGNOSIS — Z85828 Personal history of other malignant neoplasm of skin: Secondary | ICD-10-CM | POA: Diagnosis not present

## 2017-05-30 NOTE — Telephone Encounter (Signed)
Patient had an appointment scheduled for 8:40 am on June 14 2017. Patient's wife Marcie Bal) states the patient is doing well and has no issues at this time. Patient's wife Marcie Bal) understands to call if any issues come up. Patients appointment was rescheduled to 08/29/2017 at 8:20 to accommodate a more acute patient need.

## 2017-06-01 ENCOUNTER — Other Ambulatory Visit: Payer: Self-pay | Admitting: Cardiology

## 2017-06-14 ENCOUNTER — Ambulatory Visit: Payer: Medicare Other | Admitting: Cardiology

## 2017-07-05 ENCOUNTER — Ambulatory Visit (INDEPENDENT_AMBULATORY_CARE_PROVIDER_SITE_OTHER): Payer: Medicare Other | Admitting: *Deleted

## 2017-07-05 DIAGNOSIS — I481 Persistent atrial fibrillation: Secondary | ICD-10-CM

## 2017-07-05 DIAGNOSIS — Z5181 Encounter for therapeutic drug level monitoring: Secondary | ICD-10-CM | POA: Diagnosis not present

## 2017-07-05 DIAGNOSIS — I4891 Unspecified atrial fibrillation: Secondary | ICD-10-CM

## 2017-07-05 DIAGNOSIS — I2510 Atherosclerotic heart disease of native coronary artery without angina pectoris: Secondary | ICD-10-CM

## 2017-07-05 DIAGNOSIS — I4819 Other persistent atrial fibrillation: Secondary | ICD-10-CM

## 2017-07-05 LAB — POCT INR: INR: 2.5

## 2017-07-11 ENCOUNTER — Encounter: Payer: Self-pay | Admitting: Neurology

## 2017-07-11 ENCOUNTER — Ambulatory Visit (INDEPENDENT_AMBULATORY_CARE_PROVIDER_SITE_OTHER): Payer: Medicare Other | Admitting: Neurology

## 2017-07-11 VITALS — BP 124/85 | HR 57 | Ht 67.0 in | Wt 196.0 lb

## 2017-07-11 DIAGNOSIS — G513 Clonic hemifacial spasm: Secondary | ICD-10-CM

## 2017-07-11 DIAGNOSIS — I2510 Atherosclerotic heart disease of native coronary artery without angina pectoris: Secondary | ICD-10-CM | POA: Diagnosis not present

## 2017-07-11 DIAGNOSIS — G5139 Clonic hemifacial spasm, unspecified: Secondary | ICD-10-CM

## 2017-07-11 DIAGNOSIS — F0280 Dementia in other diseases classified elsewhere without behavioral disturbance: Secondary | ICD-10-CM | POA: Diagnosis not present

## 2017-07-11 DIAGNOSIS — G20 Parkinson's disease: Secondary | ICD-10-CM | POA: Diagnosis not present

## 2017-07-11 NOTE — Progress Notes (Signed)
Subjective:    Patient ID: Troy [name] is a 76 y.o. male.  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 The patient is accompanied by his wife again today. I last saw him on 01/08/2017, at which time he was on antibiotics for sinus infection. He had a couple falls. He was on Sinemet CR one pill 4 times a day. Namenda long-acting was too costly. His MMSE was 13. I suggested we switch him to generic Namenda 10 mg twice a day, continue with Aricept 10 mg daily, and reduce his clonazepam due to fall risk.   Today, 07/11/2017 (all dictated new, as well as above notes, some dictation done in note pad or Word, outside of chart, may appear as copied):  He reports being worried about his memory. He still has constipation and does not drink enough water per wife. He participates in the boxing class for Parkinson's patients and also works with a trainer twice a week. Mobility wise he is doing better actually. He continues to take generic Namenda twice daily and generic Aricept once daily, Sinemet CR one pill 4 times a day, clonazepam 0.25 mg 2 pills at night. We did reduce this recently. Of note, he was hospitalized in February secondary to altered mental status in the context of recurrent maxillary sinusitis and influenza B. He was treated symptomatically. He was discharged with home health therapy, was in the hospital from 01/19/2017 through 01/21/2017. I reviewed the  discharge summary. Per wife, he has had some falls but thankfully no injuries. He is able to catch himself sometimes.  The patient's allergies, current medications, family history, past medical history, past social history, past surgical history and problem list were reviewed and updated as appropriate.    Previously (copied from previous notes for reference):   I saw him on 09/06/2016, at which time he reported doing okay, he had some daytime somnolence which was about the same, he came off of Comtan, he was on Sinemet CR 4 times a day but was complaining of sleepiness from it. He did not sleep very well at night. He was using a dental appliance for sleep apnea minute was bothering him. He was on clonazepam 0.5 mg each night. He was following with cardiology and also had an appointment with his dentist first dental appliance. I suggested we increase his clonazepam to 0.75 mg each night, he was advised to take Sinemet CR one pill 4 times a day at 7, 11, 3 PM and 7 PM. I suggested we increase his long-acting Namenda to 21 mg daily.      I saw him on 05/03/2016, at which time he reported doing fairly well. His wife reported that he was tolerating Namenda. He was quite sleepy during the day. He was not able to tolerate Sinemet immediate release and we went back to Sinemet CR 3 times a day along with Comtan. Comtan generic was very expensive. Memory was stable per the report. He was complaining of erectile dysfunction. MMSE was 17 out of 30, clock drawing was 1 out of 4 and  animal fluency was 3/m, fairly stable findings. I suggested we increase Sinemet CR to 1 pill 4 times a day and stop the Comtan. He was encouraged to increase Namenda long-acting to 14 mg once daily. I continued his clonazepam.   I saw him on 12/29/2015, at which time he reported feeling okay. His wife reported more memory issues, including forgetfulness and at times confusion. He has been on Aricept generic for at least 3 years. He has  been taking Sinemet CR 3 times a day along with Comtan generic. Was active physically and would work out with a trainer twice a week but not so much in between. I suggested he continue with his Parkinson's medication but asked him to start on Namenda long-acting 7 mg once daily for memory loss. His MMSE was 18 out of 30 at the time.   I saw him on 08/23/2015, at which time he reported deterioration of his speech. He was in outpatient PT, OT and ST. She felt that his memory was worse. He did not do well with carbidopa-levodopa 4 times a day and his wife called on 03/31/2015 to change it back to CR. He was back on Sinemet CR 50-200 milligrams one tablet 3 times a day. He was exercising at the gym twice a day. He had finished LSVT-BIG at neuro rehab. He was on Aricept and had been on it for about 3 years. He was sleeping fairly well. Unfortunately, his wife had some surgical complications and medical setbacks. I asked him to continue with Aricept generic 10 mg once daily, Sinemet CR one tablet 3 times a day, and Comtan 3 times a day. He was also on clonazepam 0.5 mg at bedtime. I asked him to monitor his leg swelling and start using compression stockings to his legs.   I saw him on 03/07/2015, at which time he asked whether he could drive. His wife was supposed to have knee replacement surgery in July 2016 and he needed a way to get to his therapy appointments. He had benefit from outpatient occupational, physical and speech therapy in the past. He was working with a Physiological scientist 2 times a week as well. He had fallen in the past repeatedly. He felt his memory was about the same. He had some more daytime somnolence. He felt that this happened especially after taking Sinemet. He was taking Sinemet long-acting 3 times a day, 50-200 milligrams strength. He was taking Comtan 3 times a day. He had not changed his medication regimen well. He had no recent falls. His left hemifacial spasm had remained the same. He was  sleeping a little better after starting clonazepam. I suggested we switch him to immediate release Sinemet, 25-100 milligrams strength, 1 pill 4 times a day and I suggested we continue Comtan 200 mg 3 times a day. I suggested he start using lower extremity compression stockings to help with the pressure maintenance and lower extremity swelling reduction. I asked him no longer to drive.   I first met him on 08/23/2014 at the request of Dr. Erlinda Hong, at which time the patient reported 5 year history of parkinsonism. I kept him on the current medication regimen. 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    He was diagnosed with parkinsonism about 5 years ago. He was placed on Sinemet and continues to take Sinemet ER 50-200 mg strength 3 times a day. Comtan was added recently about 4 months ago. 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 Maudry Mayhew for hemifacial spasm. Previous workup included MRI brain last year showing brain atrophy and small vessel white matter changes. 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 He also had LP done in January 2015 which did not show evidence of NPH, and the CSF was clean.   He noted more difficulty with his gait after stopping the comtan, so he re-started it. His sleep is better. He has had some memory loss. He has been on Aricept for about 2 years and memory has been stable. He has no VH, or AH. He has no mood issues.    His Past Medical History Is Significant For: Past Medical History:  Diagnosis Date  . AAA (abdominal aortic aneurysm) ([place])    with mural thrombus  . Chronic anticoagulation   . CKD (chronic kidney disease) stage 2, GFR 60-89 ml/min   . Constipation   . Coronary artery disease 07/2009   severe 3 vessel ASCAD s/p CABG with LIMA to LAD, SVG to diag,  SVG to PL  . Dementia   . Diabetes mellitus   . Diverticulosis   . Facial nerve spasm    L eye, tx'd with botox  . Fecal impaction ([place])   . Gout   . Hepatic steatosis   . Hyperlipidemia   . Hypertension   . Hypothyroidism   . OSA (obstructive sleep apnea)    intolerant to CPAP followed by Dr. Lamonte Sakai  . Parkinsonism ([place])   . Persistent atrial fibrillation ([place])     His Past Surgical History Is Significant For: Past Surgical History:  Procedure Laterality Date  . ABDOMINAL AORTIC ANEURYSM REPAIR  01-2010   aorto-bi-iliac Gore Excluder stent graft  . CORONARY ARTERY BYPASS GRAFT  07-2009  . EYE SURGERY Left May  2013   Cataract  . EYE SURGERY Right June 2013   Cataract  . [place]    His Family History Is Significant For: Family History  Problem Relation Age of Onset  . Heart disease Mother        CHF  . Alzheimer's disease Mother   . Alzheimer's disease Father   . Alzheimer's disease Brother   . Alzheimer's disease Paternal Uncle     His Social History Is Significant For: Social History   Social History  . Marital status: Married    Spouse name: N/A  . Number of children: 3  . Years of education: BS   Occupational History  . retired    Social History Main Topics  . Smoking status: Former Smoker    Years: 15.00    Types: Cigarettes    Quit date: 10/07/2008  . Smokeless tobacco: Never Used  . Alcohol use 1.2 - 1.8 oz/week    2 - 3 Glasses of wine per week     Comment: A week  . Drug use: No  . Sexual activity: No   Other Topics Concern  . None   Social History Narrative   Patient lives at home with his wife   Patient is right handed   Patient drinks coffee    His Allergies Are:  Allergies  Allergen Reactions  . Bee Venom Anaphylaxis  . Other Anaphylaxis    Other reaction(s): Laryngeal Edema (ALLERGY) "a heart medication" ? unknown  . Tamsulosin Other (See Comments)    Insomnia  . Penicillins Rash  .  Tetanus Toxoids Rash  :    His Current Medications Are:  Outpatient Encounter Prescriptions as of 07/11/2017  Medication Sig  . amLODipine (NORVASC) 10 MG tablet TAKE ONE TABLET BY MOUTH ONCE DAILY  . carbidopa-levodopa (SINEMET CR) 50-200 MG tablet Take 1 tablet by mouth 4 (four) times daily. 7, 11, 3 pm and 7 pm  . carboxymethylcellulose (REFRESH PLUS) 0.5 % SOLN Place 1 drop into both eyes 3 (three) times daily as needed (dry eyes).  . Cholecalciferol (VITAMIN D-3) 1000 UNITS CAPS Take 2,000 Units by mouth daily.   . Cinnamon 500 MG capsule Take 1,000 mg by mouth daily.   . clonazePAM (KLONOPIN) 0.25 MG disintegrating tablet Take 2 tablets (0.5 mg total) by mouth at bedtime.  . DHA-EPA-Vitamin E 200-300-5 MG-MG-UNIT CAPS Take 1 capsule by mouth 2 (two) times daily as needed (dry eyes).   Marland Kitchen donepezil (ARICEPT) 10 MG tablet Take 1 tablet (10 mg total) by mouth at bedtime.  Marland Kitchen EPIPEN 2-PAK 0.3 MG/0.3ML SOAJ Inject 0.3 mg into the muscle once as needed. Allergic reaction  . fluticasone (FLONASE) 50 MCG/ACT nasal spray Place 2 sprays into the nose daily as needed for allergies.   Marland Kitchen guaiFENesin (MUCINEX) 600 MG 12 hr tablet Take 2 tablets (1,200 mg total) by mouth 2 (two) times daily.  Marland Kitchen levothyroxine (SYNTHROID, LEVOTHROID) 125 MCG tablet Take 125 mcg by mouth daily.    Marland Kitchen lisinopril (PRINIVIL,ZESTRIL) 40 MG tablet TAKE ONE TABLET BY MOUTH ONCE DAILY  . NAMENDA XR 21 MG CP24 Take 21 mg by mouth daily.  Marland Kitchen oseltamivir (TAMIFLU) 75 MG capsule Take 1 capsule (75 mg total) by mouth 2 (two) times daily.  Marland Kitchen OVER THE COUNTER MEDICATION Take 1 tablet by mouth 2 (two) times daily. Macular degeneration supplement  . OVER THE COUNTER MEDICATION Take 1 capsule by mouth daily. Homocysteine supplement  . rosuvastatin (CRESTOR) 5 MG tablet Take 1 tablet (5 mg total) by mouth daily.  Marland Kitchen warfarin (COUMADIN) 5 MG tablet Take as directed by coumadin clininc  . [DISCONTINUED] BYSTOLIC 5 MG tablet Take 5 mg by mouth daily.   No  facility-administered encounter medications on file as of 07/11/2017.   :  Review of Systems:  Out of a complete 14 point review of systems, all are reviewed and negative with the exception of these symptoms as listed below: Review of Systems  Neurological:       Pt presents today to discuss his memory. Pt's wife feels that the pt has more memory problems.    Objective:  Neurological Exam  Physical Exam Physical Examination:   Vitals:   07/11/17 1037  BP: 124/85  Pulse: (!) 57    General Examination: The patient is a very pleasant 76 y.o. male in no acute distress. She appears well-developed and well-nourished and well groomed. Good spirits.   HEENT:Normocephalic, atraumatic, pupils are equal, round and reactive to light and accommodation. Extraocular tracking shows mild saccadic breakdown without nystagmus noted. He is s/p cataract repairs. There is limitation to upper gaze. There is a very mild decrease in eye blink rate. L eyelid is droopy, slightly better, s/p eyelid surgery. Hearing is impaired mildly. 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 There is no lip, neck or jaw tremor. Neck is mildly rigid with intact passive ROM. Oropharynx exam reveals moderate mouth dryness, marginal dental hygiene.Tongue protrudes centrally and palate elevates symmetrically. There is mild  drooling.   Chest:is clear to auscultation without wheezing, rhonchi or crackles noted.  Heart:sounds are regular and normal without murmurs, rubs or gallops noted.   Abdomen:is soft, non-tender and non-distended with normal bowel sounds appreciated on auscultation.  Extremities:There is 1+ pitting edema in the distal lower extremities bilaterally, right worse than left.   Skin: is warm and dry with no trophic changes noted. Age-related changes are noted on the skin.   Musculoskeletal: exam reveals no  obvious joint deformities, tenderness, joint swelling or erythema.  Neurologically:  Mental status: The patient is awake and alert, paying fair attention. He is able to provide very little of the history. His wife provides most of the history. His memory, attention, language and knowledge are impaired. There is no aphasia, agnosia, apraxia or anomia. There is a mild degree of bradyphrenia. Speech is moderately hypophonic and mild to moderately dysarthric. Mood is congruent and affect is normal.   On 12/29/2015: MMSE: 18/30, CDT: 1/4, AFT: 4/min.  On 05/03/2016: MMSE: 17/30, CDT: 1/4, AFT: 3/min.  On 01/08/2017: MMSE: 13/30, CDT: 1/4, AFT: 3/min.   On 07/11/2017: MMSE: 12/30, CDT: 1/4, AFT: 4/min.   Cranial nerves are as described above under HEENT exam. In addition, shoulder shrug is normal with equal shoulder height noted.  Motor exam: Normal bulk, and global strength of 4+/5. No dyskinesias noted. Tone is globally mildly rigid, no significant cogwheeling is noted, overall mild bradykinesia, stable findings, no tremor noted today, that includes resting, postural or action tremor. Romberg is not testable safely as he has a tendency to stand wide-based and I did not want to risk it. Reflexes are 1+ in the upper extremities and 2+ in the lower extremities. Findings motor skills are globally mild to moderately impaired, no significant lateralization, perhaps slightly worse on the left but no telltale differences.  Cerebellar testing shows no dysmetria or intention tremor.   Sensory exam is intact to light touch in the upper and lower extremities.   Gait, station and balance: He stands up from the seated position with mild difficulty, he needs no assistance but does push himself up. initially, he has a tendency to veer backwards. He can self-correct. He has mild to moderately stooped posture, no significant lean to one side, he walks slowly, decreased arm swing bilaterally. Mild difficulty  turning, tandem walk is not possible, balance impaired, slightly worse.  Assessment and Plan:    In summary, Troy [name] is a very pleasant 76 year old male with an underlying complex medical history of diabetes, hypertension, heart disease, AAA, history of tremors, hepatic steatosis, coronary artery disease, status post open heart surgery with CABG, history of proximal A. fib, on chronic anticoagulation with Coumadin, history of left hemifacial spasm with treatment of Botox, chronic kidney disease, OSA with intolerance to CPAP in the past and treatment with oral appliance, diverticulosis, gout, hyperlipidemia, who presents for follow-up consultation of his dementia and parkinsonism. He was on Comtan and long-acting Sinemet, could not tolerate the immediate release Sinemet. We have been able to take him off the Comtan as of June 2017 without significant repercussions. He has been stable on Sinemet long-acting 1 pill 4 times a day. He was on long-acting Namenda 14 mg once daily, we increased this in October 2017 to 21 mg once daily. He has a history of REM behavior disorder and we increased his clonazepam in October 2017 from 0.5 mg to 0.75 mg each night and while he slept a little better and had less dream  enactments, he started having more falls and at his visit in February 2018 I suggested we scale back on the clonazepam to 0.5 mg each night. We switched him also to generic Namenda 10 mg twice a day secondary to cost with a long-acting. We mutually agreed to continue with his medication regimen. We talked about fall risk again today. His memory scores are generally speaking fairly stable today. We will continue to monitor. He did not need any refills on his prescription medicines from my end. I suggested a 6 month follow-up, sooner as needed. I answered all their questions today and the patient and his wife were in agreement. I spent 25 minutes in total face-to-face time with the patient, more than 50% of  which was spent in counseling and coordination of care, reviewing test results, reviewing medication and discussing or reviewing the diagnosis of parkinsonism and dementia, the prognosis and treatment options. Pertinent laboratory and imaging test results that were available during this visit with the patient were reviewed by me and considered in my medical decision making (see chart for details).

## 2017-07-11 NOTE — Patient Instructions (Addendum)
Let's continue with your 2 memory medications: namenda generic 10 mg 2 times a day, and generic Aricept 10 mg once daily in the evening.  We will keep the Sinemet CR at 1 pill 4 times a day.  We will keep the clonazepam at 0.5 mg at night.

## 2017-07-14 ENCOUNTER — Other Ambulatory Visit: Payer: Self-pay | Admitting: Neurology

## 2017-07-15 ENCOUNTER — Telehealth: Payer: Self-pay | Admitting: *Deleted

## 2017-07-15 NOTE — Telephone Encounter (Signed)
Faxed printed/signed rx clonaxepam to pt pharmacy. Fax: [phone]. Received confirmation.

## 2017-08-01 ENCOUNTER — Telehealth: Payer: Self-pay | Admitting: Neurology

## 2017-08-01 DIAGNOSIS — H04123 Dry eye syndrome of bilateral lacrimal glands: Secondary | ICD-10-CM | POA: Diagnosis not present

## 2017-08-01 DIAGNOSIS — H40013 Open angle with borderline findings, low risk, bilateral: Secondary | ICD-10-CM | POA: Diagnosis not present

## 2017-08-01 DIAGNOSIS — H16212 Exposure keratoconjunctivitis, left eye: Secondary | ICD-10-CM | POA: Diagnosis not present

## 2017-08-01 DIAGNOSIS — H01003 Unspecified blepharitis right eye, unspecified eyelid: Secondary | ICD-10-CM | POA: Diagnosis not present

## 2017-08-01 NOTE — Telephone Encounter (Signed)
Msha from [facility] has called based on request of Dr Herbert Deaner wanting pt seen for possible TIA,I asked if pt was going to ED and  she states pt just left office.  1st available appointment is on 7th of Nov.  Please call if there is a way to work pt in before this Nov. Date.  Misha can be reached at [phone]

## 2017-08-01 NOTE — Telephone Encounter (Signed)
I called Misha at [facility] back, no answer, left a message asking her to call me back.  I called pt's wife as well. Pt's wife denies the pt having any current stroke symptoms. Pt's wife reports that he has gotten progressively weaker and his memory has gotten worse. However, today during his eye exam, the doctor noticed bilateral visual field changes that could indicate a possible stroke/TIA in the past. The doctor felt like pt should be evaluated by neurology. Pt has an appt on 08/05/17 at 10:30am. I advised pt that if Dr. Rexene Alberts has any new recommendations, that I will call her back, otherwise, we will see them on 08/05/17.

## 2017-08-01 NOTE — Telephone Encounter (Signed)
Nothing further needed. thx

## 2017-08-01 NOTE — Telephone Encounter (Signed)
Spoke with the patients wife. She stated that during the patients vision exam the doctor noticed something that could be indicative of ? TIA or stroke. They were encouraging the patient to be seen by their neurologist. I have set the patient up with an apt on Monday at 10:30 am on Sept 10 th 2018.

## 2017-08-05 ENCOUNTER — Encounter: Payer: Self-pay | Admitting: Neurology

## 2017-08-05 ENCOUNTER — Ambulatory Visit (INDEPENDENT_AMBULATORY_CARE_PROVIDER_SITE_OTHER): Payer: Medicare Other | Admitting: Neurology

## 2017-08-05 VITALS — BP 127/82 | HR 60 | Ht 67.0 in | Wt 200.0 lb

## 2017-08-05 DIAGNOSIS — R413 Other amnesia: Secondary | ICD-10-CM | POA: Diagnosis not present

## 2017-08-05 DIAGNOSIS — I2510 Atherosclerotic heart disease of native coronary artery without angina pectoris: Secondary | ICD-10-CM | POA: Diagnosis not present

## 2017-08-05 DIAGNOSIS — H53489 Generalized contraction of visual field, unspecified eye: Secondary | ICD-10-CM

## 2017-08-05 DIAGNOSIS — G20 Parkinson's disease: Secondary | ICD-10-CM

## 2017-08-05 DIAGNOSIS — G20C Parkinsonism, unspecified: Secondary | ICD-10-CM

## 2017-08-05 NOTE — Progress Notes (Signed)
Subjective:    Patient ID: Troy [name] is a 76 y.o. male.  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 Troy [name] presents for a sooner than scheduled appointment for this as his eye doctor noticed a visual field defect during a routine eye exam recently. The patient is accompanied by his wife again today. I last saw Troy [name] on 07/11/2017, at which time Troy [name] was worried about his memory. Troy [name] had issues with constipation. Troy [name] was taking part in the boxing class for Parkinson's patients and was also working out with a trainer twice a week. Overall, Troy [name] was fairly stable. Troy [name] was taking Namenda generic twice daily and generic Aricept once daily, Sinemet CR one pill 4 times a day and clonazepam 0.5 mg at night. We had reduced this because of a recent fall. Troy [name] was hospitalized earlier this year in February 2018 for altered mental status in the context of recurrent maxillary sinusitis and influenza B.   Today, 08/05/2017 (all dictated new, as well as above notes, some dictation done in note pad or Word, outside of chart, may appear as copied):  Troy [name] reports that Troy [name] had a difficult time with visual fields, Troy [name] felt rushed and under stress and felt that his hemifacial spasm was flaring up at the time. His wife reports that between his parkinsonism, his hemifacial spasm and his cognitive problems Troy [name] had a difficult time with the visual field test. Nevertheless, she reports that Troy [name] has become  weaker with time but does not remember that Troy [name] had an episode of weakness. Troy [name] denies sudden onset of blindness or partial blindness. Troy [name] saw [name] on 08/01/2017 and I reviewed her note. She reports visual fields show progressing right hemifield defects. His last brain MRI that I could pull up in our system was from 11/13/2012, ordered by Dr. Melton Alar:  IMPRESSION: 1. No acute intracranial abnormality.  Stable noncontrast MRI appearance of the brain since 2011 except for some generalized volume loss. 2.  New 2 cm fluid collection/cystic mass identified in the left submandibular region, only partially visible.  Etiology and significance unclear. Neck CT (IV contrast preferred) should be able to characterize further and could be compared to a prior in 2006.  The patient's allergies, current medications, family history, past medical history, past social history, past surgical history and problem list were reviewed and updated as appropriate.    Previously (copied from previous notes for reference):   I saw Troy [name] on 01/08/2017, at which time Troy [name] was on antibiotics for sinus infection. Troy [name] had a couple falls. Troy [name] was on Sinemet CR one pill 4 times a day. Namenda long-acting was too costly. His MMSE was 13. I suggested we switch Troy [name] to generic Namenda 10 mg twice a day, continue with Aricept 10 mg daily, and reduce his clonazepam due to fall risk.     I saw Troy [name] on 09/06/2016, at which time Troy [name] reported doing okay, Troy [name] had some daytime somnolence which was about the same, Troy [name] came off of Comtan, Troy [name] was on Sinemet CR 4 times a day but  was complaining of sleepiness from it. Troy [name] did not sleep very well at night. Troy [name] was using a dental appliance for sleep apnea minute was bothering Troy [name]. Troy [name] was on clonazepam 0.5 mg each night. Troy [name] was following with cardiology and also had an appointment with his dentist first dental appliance. I suggested we increase his clonazepam to 0.75 mg each night, Troy [name] was advised to take Sinemet  CR one pill 4 times a day at 7, 11, 3 PM and 7 PM. I suggested we increase his long-acting Namenda to 21 mg daily.      I saw Troy [name] on 05/03/2016, at which time Troy [name] reported doing fairly well. His wife reported that Troy [name] was tolerating Namenda. Troy [name] was quite sleepy during the day. Troy [name] was not able to tolerate Sinemet immediate release and we went back to Sinemet CR 3 times a day along with Comtan. Comtan generic was very expensive. Memory was stable per the report. Troy [name] was complaining of erectile dysfunction. MMSE was 17 out of 30, clock drawing was 1 out of 4 and animal fluency was 3/m, fairly stable findings. I suggested we increase Sinemet CR to 1 pill 4 times a day and stop the Comtan. Troy [name] was encouraged to increase Namenda long-acting to 14 mg once daily. I continued his clonazepam.   I saw Troy [name] on 12/29/2015, at which time Troy [name] reported feeling okay. His wife reported more memory issues, including forgetfulness and at times confusion. Troy [name] has been on Aricept generic for at least 3 years. Troy [name] has been taking Sinemet CR 3 times a day along with Comtan generic. Was active physically and would work out with a trainer twice a week but not so much in between. I suggested Troy [name] continue with his Parkinson's medication but asked Troy [name] to start on Namenda long-acting 7 mg once daily for memory loss. His MMSE was 18 out of 30 at the time.   I saw Troy [name] on 08/23/2015, at which time Troy [name] reported deterioration of his speech. Troy [name] was in outpatient PT, OT and ST. She felt that his memory was worse. Troy [name] did not do well with carbidopa-levodopa 4 times a day and his wife called on 03/31/2015 to change it back to CR. Troy [name] was back on Sinemet CR 50-200 milligrams one tablet 3 times a day. Troy [name] was exercising at the gym twice a day. Troy [name] had finished LSVT-BIG at neuro rehab. Troy [name] was on Aricept and had been on it for about 3 years. Troy [name] was sleeping fairly well. Unfortunately, his wife had some surgical complications and medical setbacks. I asked Troy [name] to  continue with Aricept generic 10 mg once daily, Sinemet CR one tablet 3 times a day, and Comtan 3 times a day. Troy [name] was also on clonazepam 0.5 mg at bedtime. I asked Troy [name] to monitor his leg swelling and start using compression stockings to his legs.   I saw Troy [name] on 03/07/2015, at which time Troy [name] asked whether Troy [name] could drive. His wife was supposed to have knee replacement surgery in July 2016 and Troy [name] needed a way to get to his therapy appointments. Troy [name] had benefit from outpatient occupational, physical and speech therapy in the past. Troy [name] was working with a Physiological scientist 2 times a week as well. Troy [name] had fallen in the past repeatedly. Troy [name] felt his memory was about the same. Troy [name] had some more daytime somnolence. Troy [name] felt that this happened especially after taking Sinemet. Troy [name] was taking Sinemet long-acting 3 times a day, 50-200 milligrams strength. Troy [name] was taking Comtan  3 times a day. Troy [name] had not changed his medication regimen well. Troy [name] had no recent falls. His left hemifacial spasm had remained the same. Troy [name] was sleeping a little better after starting clonazepam. I suggested we switch Troy [name] to immediate release Sinemet, 25-100 milligrams strength, 1 pill 4 times a day and I suggested we continue Comtan 200 mg 3 times a day. I suggested Troy [name] start using lower extremity compression stockings to help with the pressure maintenance and lower extremity swelling reduction. I asked Troy [name] no longer to drive.   I first met Troy [name] on 08/23/2014 at the request of Dr. Erlinda Hong, at which time the patient reported 5 year history of parkinsonism. I kept Troy [name] on the current medication regimen. 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    Troy [name] was diagnosed with parkinsonism about 5 years ago. Troy [name] was placed on Sinemet and continues to take Sinemet ER 50-200 mg strength 3 times a day. Comtan was added recently about 4 months ago. 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 Maudry Mayhew for hemifacial spasm. Previous workup included MRI brain last year showing brain atrophy and small vessel white matter changes. 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 Troy [name] also had LP done in January 2015 which did not show evidence of NPH, and the CSF was clean.   Troy [name] noted more difficulty with his gait after stopping the comtan, so Troy [name] re-started it. His sleep is better. Troy [name] has had some memory loss. Troy [name] has been on Aricept for about 2 years and memory has been stable. Troy [name] has no VH, or AH. Troy [name] has no mood issues.    His Past Medical History Is Significant For: Past Medical History:  Diagnosis Date  . AAA (abdominal aortic aneurysm) ([place])    with mural thrombus  . Chronic anticoagulation   . CKD (chronic kidney disease) stage 2, GFR 60-89 ml/min   . Constipation   . Coronary artery disease 07/2009   severe 3 vessel ASCAD s/p CABG with LIMA to LAD, SVG to diag, SVG to PL  . Dementia   . Diabetes mellitus   . Diverticulosis   . Facial nerve spasm    L eye, tx'd with botox  . Fecal impaction ([place])   . Gout   . Hepatic steatosis   . Hyperlipidemia   . Hypertension   . Hypothyroidism   . OSA (obstructive sleep apnea)    intolerant to CPAP followed by Dr. Lamonte Sakai  . Parkinsonism ([place])   . Persistent atrial fibrillation ([place])     His Past Surgical History Is Significant For: Past Surgical History:  Procedure Laterality Date  . ABDOMINAL AORTIC ANEURYSM REPAIR  01-2010   aorto-bi-iliac Gore Excluder stent graft  . CORONARY ARTERY BYPASS GRAFT  07-2009  . EYE SURGERY Left May  2013   Cataract  . EYE SURGERY Right June 2013   Cataract  . [place]    His Family History Is Significant For: Family History  Problem Relation Age of Onset  . Heart disease Mother        CHF  . Alzheimer's disease Mother   . Alzheimer's disease Father   .  Alzheimer's disease Brother   . Alzheimer's disease Paternal Uncle     His Social History Is Significant For: Social History   Social History  .  Marital status: Married    Spouse name: N/A  . Number of children: 3  . Years of education: BS   Occupational History  . retired    Social History Main Topics  . Smoking status: Former Smoker    Years: 15.00    Types: Cigarettes    Quit date: 10/07/2008  . Smokeless tobacco: Never Used  . Alcohol use 1.2 - 1.8 oz/week    2 - 3 Glasses of wine per week     Comment: A week  . Drug use: No  . Sexual activity: No   Other Topics Concern  . None   Social History Narrative   Patient lives at home with his wife   Patient is right handed   Patient drinks coffee    His Allergies Are:  Allergies  Allergen Reactions  . Bee Venom Anaphylaxis  . Other Anaphylaxis    Other reaction(s): Laryngeal Edema (ALLERGY) "a heart medication" ? unknown  . Tamsulosin Other (See Comments)    Insomnia  . Penicillins Rash  . Tetanus Toxoids Rash  :   His Current Medications Are:  Outpatient Encounter Prescriptions as of 08/05/2017  Medication Sig  . amLODipine (NORVASC) 10 MG tablet TAKE ONE TABLET BY MOUTH ONCE DAILY  . carbidopa-levodopa (SINEMET CR) 50-200 MG tablet Take 1 tablet by mouth 4 (four) times daily. 7, 11, 3 pm and 7 pm  . carboxymethylcellulose (REFRESH PLUS) 0.5 % SOLN Place 1 drop into both eyes 3 (three) times daily as needed (dry eyes).  . Cholecalciferol (VITAMIN D-3) 1000 UNITS CAPS Take 2,000 Units by mouth daily.   . Cinnamon 500 MG capsule Take 1,000 mg by mouth daily.   . clonazePAM (KLONOPIN) 0.25 MG disintegrating tablet DISSOLVE 2 TABLETS IN MOUTH AT BEDTIME  . DHA-EPA-Vitamin E 676-195-0 MG-MG-UNIT CAPS Take 1 capsule by mouth 2 (two) times daily as needed (dry eyes).   Marland Kitchen donepezil (ARICEPT) 10 MG tablet Take 1 tablet (10 mg total) by mouth at bedtime.  Marland Kitchen EPIPEN 2-PAK 0.3 MG/0.3ML SOAJ Inject 0.3 mg into the muscle  once as needed. Allergic reaction  . fluticasone (FLONASE) 50 MCG/ACT nasal spray Place 2 sprays into the nose daily as needed for allergies.   Marland Kitchen guaiFENesin (MUCINEX) 600 MG 12 hr tablet Take 2 tablets (1,200 mg total) by mouth 2 (two) times daily.  Marland Kitchen levothyroxine (SYNTHROID, LEVOTHROID) 125 MCG tablet Take 125 mcg by mouth daily.    Marland Kitchen lisinopril (PRINIVIL,ZESTRIL) 40 MG tablet TAKE ONE TABLET BY MOUTH ONCE DAILY  . NAMENDA XR 21 MG CP24 Take 21 mg by mouth daily.  Marland Kitchen OVER THE COUNTER MEDICATION Take 1 tablet by mouth 2 (two) times daily. Macular degeneration supplement  . OVER THE COUNTER MEDICATION Take 1 capsule by mouth daily. Homocysteine supplement  . rosuvastatin (CRESTOR) 5 MG tablet Take 1 tablet (5 mg total) by mouth daily.  Marland Kitchen warfarin (COUMADIN) 5 MG tablet Take as directed by coumadin clin[facility]  . [DISCONTINUED] oseltamivir (TAMIFLU) 75 MG capsule Take 1 capsule (75 mg total) by mouth 2 (two) times daily.   No facility-administered encounter medications on file as of 08/05/2017.   :  Review of Systems:  Out of a complete 14 point review of systems, all are reviewed and negative with the exception of these symptoms as listed below:  Review of Systems  Neurological:       Pt presents today to discuss visual field changes that his eye doctor, [name], noticed at his exam last  week.    Objective:  Neurological Exam  Physical Exam Physical Examination:   Vitals:   08/05/17 1034  BP: 127/82  Pulse: 60    General Examination: The patient is a very pleasant 76 y.o. male in no acute distress. Troy [name] appears well-developed and well-nourished and well groomed.   HEENT:Normocephalic, atraumatic, pupils are equal, round and reactive to light and accommodation. Extraocular tracking shows mild saccadic breakdown without nystagmus noted. Troy [name] is s/p cataract repairs. There is limitation to upper gaze. There is a very mild decrease in eye blink rate. L eyelid is droopy, slightly better,  s/p eyelid surgery. Hearing is impaired mildly. 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 There is no lip, neck or jaw tremor. Neck is mildly rigid with intact passive ROM. Oropharynx exam reveals moderate mouth dryness, marginal dental hygiene.Tongue protrudes centrally and palate elevates symmetrically. There is mild drooling.   Chest:is clear to auscultation without wheezing, rhonchi or crackles noted.  Heart:sounds are regular and normal without murmurs, rubs or gallops noted.   Abdomen:is soft, non-tender and non-distended with normal bowel sounds appreciated on auscultation.  Extremities:There is 2+ pitting edema in the distal lower extremities bilaterally, right worse than left.   Skin: is warm and dry with no trophic changes noted. Age-related changes are noted on the skin.   Musculoskeletal: exam reveals no obvious joint deformities, tenderness, joint swelling or erythema.  Neurologically:  Mental status: The patient is awake and alert, paying fair attention. Troy [name] is able to provide very little ofthe history. His wife provides most of the history. His memory, attention, language and knowledge are impaired. There is no aphasia, agnosia, apraxia or anomia. There is a mild degree of bradyphrenia. Speech is moderately hypophonic and mild to moderately dysarthric. Mood is congruent and affect is normal.   On 12/29/2015: MMSE: 18/30, CDT: 1/4, AFT: 4/min.  On 05/03/2016: MMSE: 17/30, CDT: 1/4, AFT: 3/min.  On 01/08/2017: MMSE: 13/30, CDT: 1/4, AFT: 3/min.   On 07/11/2017: MMSE: 12/30, CDT: 1/4, AFT: 4/min.   Cranial nerves are as described above under HEENT exam. In addition, shoulder shrug is normal with equal shoulder height noted.  Motor exam: Normal bulk, and global strength of 4+/5, overall R grip strength seems weaker than L. No dyskinesias noted.Tone is globally mildly  rigid, no significant cogwheeling is noted, overall mild bradykinesia, stable findings, no tremor noted today, that includes resting, postural or action tremor. Romberg is not testable safely as Troy [name] has a tendency to stand wide-based. Reflexes are 1+ in the upper extremities and 2+ in the lower extremities. Findings motor skills are globally mild to moderately impaired, no significant lateralization, perhaps slightly worse on the left but no telltale differences, all stable. Cerebellar testing shows no dysmetria or intention tremor.   Sensory exam is intact to light touch in the upper and lower extremities.   Gait, station and balance:Troy [name] stands up from the seated position with mild difficulty, Troy [name] needs no assistance but does push himself up. initially, Troy [name] has a tendency to veer backwards. Troy [name] can self-correct. Troy [name] has mild to moderately stooped posture, no significant lean to one side, Troy [name] walks slowly, decreased arm swing bilaterally. Mild difficulty turning, tandem walk is not possible, balance impaired, slightly worse.  Assessment and Plan:   In summary, MARQUS MACPHEE a very pleasant 76 year old malewith an underlying complex medical history of diabetes, hypertension, heart disease, AAA, history  of tremors, hepatic steatosis, coronary artery disease, status post open heart surgery with CABG, history of proximal A. fib, on chronic anticoagulation with Coumadin, history of left hemifacial spasm with treatment of Botox, chronic kidney disease, OSA with intolerance to CPAP in the past and treatment with oral appliance, diverticulosis, gout, hyperlipidemia, who presents for a sooner than scheduled appointment for a concern for increase in visual field loss during a routine eye exam last week. Troy [name] does not have a history of stroke or recent acute changes. Nevertheless, I think we should proceed with a brain MRI without contrast and compare with 2013 when Troy [name] had his last brain MRI. It is possible that  between his hemifacial spasm, his parkinsonism and cognitive issues Troy [name] had difficulty with the visual field test but we should certainly look into it. Global strength is mildly impaired, perhaps a slight weakness noted in the right grip strength compared to left. Otherwise, his exam is stable. We will continue with his Sinemet long-acting, Namenda generic twice daily and clonazepam which is currently the dissolvable kind 0.25 mg strength 2 pills at night, we will switch it next time to 0.5 mg tablets, once daily. I would like to see Troy [name] back routinely for his scheduled appointment in February 2019. We will keep them posted as to his brain MRI results and take it from there as well. I answered all their questions today and the patient and his wife were in agreement. I spent 25 minutes in total face-to-face time with the patient, more than 50% of which was spent in counseling and coordination of care, reviewing test results, reviewing medication and discussing or reviewing the diagnosis of visual field loss, parkinsonism, cognitive decline, the prognosis and treatment options. Pertinent laboratory and imaging test results that were available during this visit with the patient were reviewed by me and considered in my medical decision making (see chart for details).

## 2017-08-05 NOTE — Patient Instructions (Addendum)
I do not detect anything new on your exam, with the exception, that perhaps your right hand grip strength may be a little weaker.   For your visual field change, we will do a brain scan, called MRI and call you with the test results. We will have to schedule you for this on a separate date. This test requires authorization from your insurance, and we will take care of the insurance process.  Let's keep our appointment for Feb and cancel the one for Nov.

## 2017-08-16 ENCOUNTER — Ambulatory Visit (INDEPENDENT_AMBULATORY_CARE_PROVIDER_SITE_OTHER): Payer: Medicare Other | Admitting: *Deleted

## 2017-08-16 DIAGNOSIS — I4819 Other persistent atrial fibrillation: Secondary | ICD-10-CM

## 2017-08-16 DIAGNOSIS — I481 Persistent atrial fibrillation: Secondary | ICD-10-CM

## 2017-08-16 DIAGNOSIS — Z5181 Encounter for therapeutic drug level monitoring: Secondary | ICD-10-CM | POA: Diagnosis not present

## 2017-08-16 DIAGNOSIS — I4891 Unspecified atrial fibrillation: Secondary | ICD-10-CM | POA: Diagnosis not present

## 2017-08-16 LAB — POCT INR: INR: 2.4

## 2017-08-17 ENCOUNTER — Ambulatory Visit
Admission: RE | Admit: 2017-08-17 | Discharge: 2017-08-17 | Disposition: A | Discharge status: Home / Self Care | Payer: Medicare Other | Source: Ambulatory Visit | Attending: Neurology | Admitting: Neurology

## 2017-08-17 DIAGNOSIS — H53489 Generalized contraction of visual field, unspecified eye: Secondary | ICD-10-CM | POA: Diagnosis not present

## 2017-08-17 DIAGNOSIS — G20C Parkinsonism, unspecified: Secondary | ICD-10-CM

## 2017-08-17 DIAGNOSIS — R413 Other amnesia: Secondary | ICD-10-CM | POA: Diagnosis not present

## 2017-08-17 DIAGNOSIS — G20 Parkinson's disease: Secondary | ICD-10-CM | POA: Diagnosis not present

## 2017-08-20 ENCOUNTER — Telehealth: Payer: Self-pay

## 2017-08-20 NOTE — Telephone Encounter (Signed)
I called pt, spoke to pt's wife, Troy [name], per DPR. I advised pt's wife that pt's MRI showed stable findings, with the exception of atrophy, which has progressed compared to MRI from 2013, which is to be expected in nearly 5 years' time. There was no acute findings, and no etiology in his visual field change as noted by ophthalmology. No action is required on this test at this time and I reminded pt's wife to keep the Feb 2019 appt and call us in the interim with questions or concerns. Pt's wife verbalized understanding of results and had no questions at this time.

## 2017-08-20 NOTE — Telephone Encounter (Signed)
-----   Message from Star Age, MD sent at 08/20/2017  8:08 AM EDT ----- Please call pt or better to call wife re: brain MRI: stable findings, with the exception of the atrophy, which has progressed compared to his MRI from 10/2012; this is to be expected in nearly 5 years' time. There were no acute findings, in particular, no etiology for his visual field change as noted by ophthalmology. No further action is required on this test at this time.  Please remind patient to keep any upcoming appointments or tests and to call us with any interim questions, concerns, problems or updates. Thanks,  Star Age, MD, PhD

## 2017-08-20 NOTE — Progress Notes (Signed)
Please call pt or better to call wife re: brain MRI: stable findings, with the exception of the atrophy, which has progressed compared to his MRI from 10/2012; this is to be expected in nearly 5 years' time. There were no acute findings, in particular, no etiology for his visual field change as noted by ophthalmology. No further action is required on this test at this time.  Please remind patient to keep any upcoming appointments or tests and to call us with any interim questions, concerns, problems or updates. Thanks,  Star Age, MD, PhD

## 2017-08-26 DIAGNOSIS — G518 Other disorders of facial nerve: Secondary | ICD-10-CM | POA: Diagnosis not present

## 2017-08-26 DIAGNOSIS — G5132 Clonic hemifacial spasm, left: Secondary | ICD-10-CM | POA: Diagnosis not present

## 2017-08-29 ENCOUNTER — Ambulatory Visit: Payer: Medicare Other | Admitting: Cardiology

## 2017-09-02 ENCOUNTER — Telehealth: Payer: Self-pay | Admitting: *Deleted

## 2017-09-02 DIAGNOSIS — H353132 Nonexudative age-related macular degeneration, bilateral, intermediate dry stage: Secondary | ICD-10-CM | POA: Diagnosis not present

## 2017-09-02 DIAGNOSIS — H35033 Hypertensive retinopathy, bilateral: Secondary | ICD-10-CM | POA: Diagnosis not present

## 2017-09-02 DIAGNOSIS — H26492 Other secondary cataract, left eye: Secondary | ICD-10-CM | POA: Diagnosis not present

## 2017-09-02 DIAGNOSIS — H40013 Open angle with borderline findings, low risk, bilateral: Secondary | ICD-10-CM | POA: Diagnosis not present

## 2017-09-02 NOTE — Telephone Encounter (Signed)
MRI faxed to [facility] (f) [phone]

## 2017-09-03 ENCOUNTER — Ambulatory Visit (INDEPENDENT_AMBULATORY_CARE_PROVIDER_SITE_OTHER): Payer: Medicare Other | Admitting: Cardiology

## 2017-09-03 ENCOUNTER — Encounter: Payer: Self-pay | Admitting: Cardiology

## 2017-09-03 VITALS — BP 114/60 | HR 58 | Ht 67.0 in | Wt 198.8 lb

## 2017-09-03 DIAGNOSIS — I4819 Other persistent atrial fibrillation: Secondary | ICD-10-CM

## 2017-09-03 DIAGNOSIS — I2510 Atherosclerotic heart disease of native coronary artery without angina pectoris: Secondary | ICD-10-CM | POA: Diagnosis not present

## 2017-09-03 DIAGNOSIS — I481 Persistent atrial fibrillation: Secondary | ICD-10-CM

## 2017-09-03 DIAGNOSIS — E7800 Pure hypercholesterolemia, unspecified: Secondary | ICD-10-CM

## 2017-09-03 DIAGNOSIS — I10 Essential (primary) hypertension: Secondary | ICD-10-CM

## 2017-09-03 DIAGNOSIS — R600 Localized edema: Secondary | ICD-10-CM | POA: Insufficient documentation

## 2017-09-03 HISTORY — DX: Localized edema: R60.0

## 2017-09-03 LAB — COMPREHENSIVE METABOLIC PANEL
A/G RATIO: 1.9 (ref 1.2–2.2)
ALBUMIN: 4.6 g/dL (ref 3.5–4.8)
ALT: 13 IU/L (ref 0–44)
AST: 24 IU/L (ref 0–40)
Alkaline Phosphatase: 62 IU/L (ref 39–117)
BILIRUBIN TOTAL: 0.7 mg/dL (ref 0.0–1.2)
BUN / CREAT RATIO: 15 (ref 10–24)
BUN: 16 mg/dL (ref 8–27)
CHLORIDE: 105 mmol/L (ref 96–106)
CO2: 24 mmol/L (ref 20–29)
Calcium: 10.2 mg/dL (ref 8.6–10.2)
Creatinine, Ser: 1.05 mg/dL (ref 0.76–1.27)
GFR calc non Af Amer: 68 mL/min/{1.73_m2} (ref >59)
GFR, EST AFRICAN AMERICAN: 78 mL/min/{1.73_m2} (ref >59)
GLOBULIN, TOTAL: 2.4 g/dL (ref 1.5–4.5)
Glucose: 117 mg/dL — ABNORMAL HIGH (ref 65–99)
POTASSIUM: 5 mmol/L (ref 3.5–5.2)
SODIUM: 147 mmol/L — AB (ref 134–144)
TOTAL PROTEIN: 7 g/dL (ref 6.0–8.5)

## 2017-09-03 LAB — LIPID PANEL
CHOL/HDL RATIO: 2.9 ratio (ref 0.0–5.0)
Cholesterol, Total: 109 mg/dL (ref 100–199)
HDL: 37 mg/dL — ABNORMAL LOW (ref >39)
LDL Calculated: 50 mg/dL (ref 0–99)
Triglycerides: 110 mg/dL (ref 0–149)
VLDL Cholesterol Cal: 22 mg/dL (ref 5–40)

## 2017-09-03 NOTE — Patient Instructions (Signed)
Medication Instructions:  Your physician recommends that you continue on your current medications as directed. Please refer to the Current Medication list given to you today.   Labwork: CMET and Lipids today  Testing/Procedures: None  Follow-Up: Your physician wants you to follow-up in: 6 months with Dr. Radford Pax.  You will receive a reminder letter in the mail two months in advance. If you don't receive a letter, please call our office to schedule the follow-up appointment.   Any Other Special Instructions Will Be Listed Below (If Applicable).     If you need a refill on your cardiac medications before your next appointment, please call your pharmacy.

## 2017-09-03 NOTE — Progress Notes (Signed)
Cardiology Office Note:    Date:  09/03/2017   ID:  Troy [name], DOB 1940/12/20, MRN [id number]  PCP:  Jonathon Jordan, MD  Cardiologist:  Fransico Him, MD   Referring MD: Jonathon Jordan, MD   Chief Complaint  Patient presents with  . Coronary Artery Disease  . Hypertension  . Atrial Fibrillation  . Hyperlipidemia  . Sleep Apnea    History of Present Illness:    Troy [name] is a 76 y.o. male with a hx of ASCAD s/p CABG, HTN, PAF, dyslipidemia, OSA now using oral device and dyslipidemia.  He is here today for followup and is doing well.  He denies any chest pain or pressure, SOB, DOE, PND, orthopnea, LE edema, dizziness, palpitations or syncope. His wife says that his Parkinson's has progressed.  He does have some problems with his gait but significant no falls.  He is compliant with his meds and is tolerating meds with no SE.    Past Medical History:  Diagnosis Date  . AAA (abdominal aortic aneurysm) ([place])    with mural thrombus  . Chronic anticoagulation   . CKD (chronic kidney disease) stage 2, GFR 60-89 ml/min   . Constipation   . Coronary artery disease 07/2009   severe 3 vessel ASCAD s/p CABG with LIMA to LAD, SVG to diag, SVG to PL  . Dementia   . Diabetes mellitus   . Diverticulosis   . Edema extremities 09/03/2017  . Facial nerve spasm    L eye, tx'd with botox  . Fecal impaction ([place])   . Gout   . Hepatic steatosis   . Hyperlipidemia   . Hypertension   . Hypothyroidism   . OSA (obstructive sleep apnea)    intolerant to CPAP followed by Dr. Lamonte Sakai  . Parkinsonism ([place])   . Persistent atrial fibrillation [facility])     Past Surgical History:  Procedure Laterality Date  . ABDOMINAL AORTIC ANEURYSM REPAIR  01-2010   aorto-bi-iliac Gore Excluder stent graft  . CORONARY ARTERY BYPASS GRAFT  07-2009  . EYE SURGERY Left May  2013   Cataract  . EYE SURGERY Right June 2013   Cataract  . SPINE SURGERY  1998    Current Medications: Current Meds  Medication  Sig  . amLODipine (NORVASC) 10 MG tablet TAKE ONE TABLET BY MOUTH ONCE DAILY  . carbidopa-levodopa (SINEMET CR) 50-200 MG tablet Take 1 tablet by mouth 4 (four) times daily. 7, 11, 3 pm and 7 pm  . carboxymethylcellulose (REFRESH PLUS) 0.5 % SOLN Place 1 drop into both eyes 3 (three) times daily as needed (dry eyes).  . Cholecalciferol (VITAMIN D-3) 1000 UNITS CAPS Take 2,000 Units by mouth daily.   . Cinnamon 500 MG capsule Take 1,000 mg by mouth daily.   . clonazePAM (KLONOPIN) 0.25 MG disintegrating tablet DISSOLVE 2 TABLETS IN MOUTH AT BEDTIME  . DHA-EPA-Vitamin E 782-956-2 MG-MG-UNIT CAPS Take 1 capsule by mouth 2 (two) times daily as needed (dry eyes).   Marland Kitchen donepezil (ARICEPT) 10 MG tablet Take 1 tablet (10 mg total) by mouth at bedtime.  Marland Kitchen EPIPEN 2-PAK 0.3 MG/0.3ML SOAJ Inject 0.3 mg into the muscle once as needed. Allergic reaction  . fluticasone (FLONASE) 50 MCG/ACT nasal spray Place 2 sprays into the nose daily as needed for allergies.   Marland Kitchen guaiFENesin (MUCINEX) 600 MG 12 hr tablet Take 2 tablets (1,200 mg total) by mouth 2 (two) times daily.  Marland Kitchen levothyroxine (SYNTHROID, LEVOTHROID) 125 MCG tablet Take 125 mcg  by mouth daily.    Marland Kitchen lisinopril (PRINIVIL,ZESTRIL) 40 MG tablet TAKE ONE TABLET BY MOUTH ONCE DAILY  . NAMENDA XR 21 MG CP24 Take 21 mg by mouth daily.  Marland Kitchen OVER THE COUNTER MEDICATION Take 1 tablet by mouth 2 (two) times daily. Macular degeneration supplement  . OVER THE COUNTER MEDICATION Take 1 capsule by mouth daily. Homocysteine supplement  . rosuvastatin (CRESTOR) 5 MG tablet Take 1 tablet (5 mg total) by mouth daily.  Marland Kitchen warfarin (COUMADIN) 5 MG tablet Take as directed by coumadin clininc     Allergies:   Bee venom; Other; Tamsulosin; Penicillins; and Tetanus toxoids   Social History   Social History  . Marital status: Married    Spouse name: N/A  . Number of children: 3  . Years of education: BS   Occupational History  . retired    Social History Main Topics  .  Smoking status: Former Smoker    Years: 15.00    Types: Cigarettes    Quit date: 10/07/2008  . Smokeless tobacco: Never Used  . Alcohol use 1.2 - 1.8 oz/week    2 - 3 Glasses of wine per week     Comment: A week  . Drug use: No  . Sexual activity: No   Other Topics Concern  . None   Social History Narrative   Patient lives at home with his wife   Patient is right handed   Patient drinks coffee     Family History: The patient's family history includes Alzheimer's disease in his brother, father, mother, and paternal uncle; Heart disease in his mother.  ROS:   Please see the history of present illness.    ROS  All other systems reviewed and negative.   EKGs/Labs/Other Studies Reviewed:    The following studies were reviewed today: none  EKG:  EKG is not ordered today.    Recent Labs: 12/06/2016: ALT 11 01/20/2017: Hemoglobin 17.7; Magnesium 1.9; Platelets 76; TSH 0.718 01/21/2017: BUN 15; Creatinine, Ser 0.84; Potassium 3.8; Sodium 139   Recent Lipid Panel    Component Value Date/Time   CHOL 112 12/06/2016 1008   TRIG 120 12/06/2016 1008   HDL 37 (L) 12/06/2016 1008   CHOLHDL 3.0 12/06/2016 1008   CHOLHDL 2.7 06/01/2016 0743   VLDL 21 06/01/2016 0743   LDLCALC 51 12/06/2016 1008    Physical Exam:    VS:  BP 114/60   Pulse (!) 58   Ht 5\' 7"  (1.702 m)   Wt 198 lb 12.8 oz (90.2 kg)   SpO2 96%   BMI 31.14 kg/m     Wt Readings from Last 3 Encounters:  09/03/17 198 lb 12.8 oz (90.2 kg)  08/05/17 200 lb (90.7 kg)  07/11/17 196 lb (88.9 kg)     GEN:  Well nourished, well developed in no acute distress HEENT: Normal NECK: No JVD; No carotid bruits LYMPHATICS: No lymphadenopathy CARDIAC: RRR, no murmurs, rubs, gallops RESPIRATORY:  Clear to auscultation without rales, wheezing or rhonchi  ABDOMEN: Soft, non-tender, non-distended MUSCULOSKELETAL:  2+ edema; No deformity  SKIN: Warm and dry NEUROLOGIC:  Alert and oriented x 3 PSYCHIATRIC:  Normal affect    ASSESSMENT:    1. Coronary artery disease involving native coronary artery of native heart without angina pectoris   2. Essential hypertension   3. Persistent atrial fibrillation ([place])   4. Pure hypercholesterolemia   5. Edema extremities    PLAN:    In order of problems listed above:  1.  ASCAD s/p remote CABG - he has no angina symptoms.  He will continue on statin.  He is not on ASA due to warfarin.   2.  HTN - Bp is well controlled on exam today.  He will continue on Lisinopril 40mg  daily and amlodipine 10mg  daily.  3.  Persistent atrial fibrillation - he is maintaining NSR on exam today. He will continue on warfarin followed in our coumadin clinic.    4.  Hyperlipidemia with LDL goal < 70.  He will continue on crestor 5mg  daily.  I will get an FLP and ALT.   5.  LE edema - he has 2+ edema R>L but it does not bother him.  We discussed the addition of a diuretic but with his Parkinsons and having to get up to go to the bathroom with poor gait, it may make things difficult for him.  He will continue with his compression hose for now.     Medication Adjustments/Labs and Tests Ordered: Current medicines are reviewed at length with the patient today.  Concerns regarding medicines are outlined above.  No orders of the defined types were placed in this encounter.  No orders of the defined types were placed in this encounter.   Signed, Fransico Him, MD  09/03/2017 11:07 AM    [place]

## 2017-09-10 NOTE — Progress Notes (Signed)
[facility] Note  09/11/2017     CHIEF COMPLAINT Patient presents for Retina Evaluation   HISTORY OF PRESENT ILLNESS: Troy [name] is a 76 y.o. male who presents to the clinic today for:   HPI    Retina Evaluation  In both eyes.  This started 2 years ago.  Associated Symptoms Blind Spot and Distortion.  Negative for Flashes, Floaters, Pain, Glare, Jaw Claudication, Weight Loss, Photophobia, Scalp Tenderness, Fever, Fatigue, Shoulder/Hip pain, Trauma and Redness.  Context:  distance vision, mid-range vision and near vision.  Treatments tried include artificial tears.  I, the attending physician,  performed the HPI with the patient and updated documentation appropriately.        Comments  Referral of DR. Hecker Retina evaluation . Patient has parkinson's , wife states patient has had a change in vision for about one- half years.  Wife states when she hands her husband something he reaches around object and  distinguishing colors has become an issue.Patient denies pain , flash and floaters . Patient uses refresh gtts and takes mega 3 vitamin, supplement for  macular generation, B complex, and D3.     Last edited by Bernarda Caffey, MD on 09/11/2017  1:46 PM. (History)      Referring physician: Monna Fam, MD [place], [place] [address]  HISTORICAL INFORMATION:   Selected notes from the MEDICAL RECORD NUMBER Referral from Dr. Raliegh Ip. 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 2013); Yag Cap OU; blepharoplasty OU (2016); diplopia; DES OU; Ptosis OU; PVD OU;       S/p YAG cap OU 2015; s/p blepharoplasty OS 2015 and 2016 (Dr. Toy Cookey) PMH- HTN; DM type II (dx 2012; diet controlled);    CURRENT MEDICATIONS: Current Outpatient Prescriptions (Ophthalmic Drugs)  Medication Sig  . carboxymethylcellulose (REFRESH PLUS) 0.5 % SOLN Place 1 drop into both  eyes 3 (three) times daily as needed (dry eyes).   No current facility-administered medications for this visit.  (Ophthalmic Drugs)   Current Outpatient Prescriptions (Other)  Medication Sig  . amLODipine (NORVASC) 10 MG tablet TAKE ONE TABLET BY MOUTH ONCE DAILY  . carbidopa-levodopa (SINEMET CR) 50-200 MG tablet Take 1 tablet by mouth 4 (four) times daily. 7, 11, 3 pm and 7 pm  . Cholecalciferol (VITAMIN D-3) 1000 UNITS CAPS Take 2,000 Units by mouth daily.   . Cinnamon 500 MG capsule Take 1,000 mg by mouth daily.   . clindamycin (CLEOCIN) 150 MG capsule   . clonazePAM (KLONOPIN) 0.25 MG disintegrating tablet DISSOLVE 2 TABLETS IN MOUTH AT BEDTIME  . DHA-EPA-Vitamin E 469-629-5 MG-MG-UNIT CAPS Take 1 capsule by mouth 2 (two) times daily as needed (dry eyes).   Marland Kitchen donepezil (ARICEPT) 10 MG tablet Take 1 tablet (10 mg total) by mouth at bedtime.  Marland Kitchen EPIPEN 2-PAK 0.3 MG/0.3ML SOAJ Inject 0.3 mg into the muscle once as needed. Allergic reaction  . fluticasone (FLONASE) 50 MCG/ACT nasal spray Place 2 sprays into the nose daily as needed for allergies.   Marland Kitchen guaiFENesin (MUCINEX) 600 MG 12 hr tablet Take 2 tablets (1,200 mg total) by mouth 2 (two) times daily.  Marland Kitchen levothyroxine (SYNTHROID, LEVOTHROID) 125 MCG tablet Take 125 mcg by mouth daily.    Marland Kitchen lisinopril (PRINIVIL,ZESTRIL) 40 MG tablet TAKE ONE TABLET BY MOUTH ONCE DAILY  . NAMENDA XR 21 MG CP24 Take 21 mg by mouth daily.  Marland Kitchen  OVER THE COUNTER MEDICATION Take 1 tablet by mouth 2 (two) times daily. Macular degeneration supplement  . OVER THE COUNTER MEDICATION Take 1 capsule by mouth daily. Homocysteine supplement  . rosuvastatin (CRESTOR) 5 MG tablet Take 1 tablet (5 mg total) by mouth daily.  Marland Kitchen warfarin (COUMADIN) 5 MG tablet Take as directed by coumadin clininc   No current facility-administered medications for this visit.  (Other)      REVIEW OF SYSTEMS: ROS    Positive for: Gastrointestinal, Neurological, Genitourinary,  Musculoskeletal, HENT, Cardiovascular, Eyes, Heme/Lymph   Negative for: Constitutional, Skin, Endocrine, Respiratory, Psychiatric, Allergic/Imm   Last edited by Zenovia Jordan, LPN on 30/05/6225  3:33 PM. (History)       ALLERGIES Allergies  Allergen Reactions  . Bee Venom Anaphylaxis  . Other Anaphylaxis    Other reaction(s): Laryngeal Edema (ALLERGY) "a heart medication" ? unknown  . Tamsulosin Other (See Comments)    Insomnia  . Penicillins Rash  . Tetanus Toxoids Rash    PAST MEDICAL HISTORY Past Medical History:  Diagnosis Date  . AAA (abdominal aortic aneurysm) ([place])    with mural thrombus  . Chronic anticoagulation   . CKD (chronic kidney disease) stage 2, GFR 60-89 ml/min   . Constipation   . Coronary artery disease 07/2009   severe 3 vessel ASCAD s/p CABG with LIMA to LAD, SVG to diag, SVG to PL  . Dementia   . Diabetes mellitus   . Diverticulosis   . Edema extremities 09/03/2017  . Facial nerve spasm    L eye, tx'd with botox  . Fecal impaction ([place])   . Gout   . Hepatic steatosis   . Hyperlipidemia   . Hypertension   . Hypothyroidism   . OSA (obstructive sleep apnea)    intolerant to CPAP followed by Dr. Lamonte Sakai  . Parkinsonism ([place])   . Persistent atrial fibrillation [facility])    Past Surgical History:  Procedure Laterality Date  . ABDOMINAL AORTIC ANEURYSM REPAIR  01-2010   aorto-bi-iliac Gore Excluder stent graft  . CORONARY ARTERY BYPASS GRAFT  07-2009  . EYE SURGERY Left May  2013   Cataract  . EYE SURGERY Right June 2013   Cataract  . SPINE SURGERY  1998    FAMILY HISTORY Family History  Problem Relation Age of Onset  . Heart disease Mother        CHF  . Alzheimer's disease Mother   . Alzheimer's disease Father   . Alzheimer's disease Brother   . Alzheimer's disease Paternal Uncle     SOCIAL HISTORY Social History  Substance Use Topics  . Smoking status: Former Smoker    Years: 15.00    Types: Cigarettes    Quit date: 10/07/2008  .  Smokeless tobacco: Never Used  . Alcohol use 1.2 - 1.8 oz/week    2 - 3 Glasses of wine per week     Comment: A week         OPHTHALMIC EXAM:  Base Eye Exam    Visual Acuity (Snellen - Linear)      Right Left   Dist cc 20/40 +1 20/50 -2   Dist ph cc 20/40 +2 20/40 +2   Correction:  Glasses       Tonometry (Tonopen, 1:45 PM)      Right Left   Pressure 17 17       Pupils      Dark Light Shape React APD   Right 3 2.5 Round Minimal Trace  Left 2 1.5 Round Minimal None       Visual Fields      Left Right    Full    Restrictions  Total inferior temporal deficiency       Extraocular Movement      Right Left    Full, Ortho Full, Ortho       Neuro/Psych    Oriented x3:  Yes   Mood/Affect:  Normal       Dilation    Both eyes:  1.0% Mydriacyl, 2.5% Phenylephrine @ 1:45 PM        Slit Lamp and Fundus Exam    External Exam      Right Left   External Brow ptosis Brow ptosis       Slit Lamp Exam      Right Left   Lids/Lashes Dermatochalasis - upper lid, Scurf, Ptosis Ptosis, Scurf   Conjunctiva/Sclera White and quiet White and quiet   Cornea 3+ Punctate epithelial erosions 3+ Punctate epithelial erosions   Anterior Chamber Deep and quiet Deep and quiet   Iris Round and dilated Round and dilated   Lens Posterior chamber intraocular lens - good postion Posterior chamber intraocular lens - good postion   Vitreous Asteroid hyalosis Asteroid hyalosis       Fundus Exam      Right Left   Disc Tilted disc, cupping, Pallor Tilted disc, cupping, Pallor   C/D Ratio 0.8 0.8   Macula Drusen, Retinal pigment epithelial mottling, no heme or edema Drusen, Retinal pigment epithelial mottling, no heme or edema   Vessels Vascular attenuation - mild Vascular attenuation - mild   Periphery attached attached   Poor view in both eyes        Refraction    Wearing Rx      Sphere Add   Right -1.00 +2.75   Left -1.50 +2.75   Age:  2   Type:  Bifocal       Manifest  Refraction (Retinoscopy)      Dist VA   Right 20/30-1   Left 20/40-2          IMAGING AND PROCEDURES  Imaging and Procedures for 09/12/17  OCT, Retina - OU - Both Eyes     Right Eye Quality was poor. Central Foveal Thickness: 361. Progression has no prior data. Findings include normal foveal contour, no IRF, no SRF, outer retinal atrophy, epiretinal membrane (PVD; retinal drusen; trace ERM; peripapillary VMT; ).   Left Eye Quality was poor. Central Foveal Thickness: 273. Progression has no prior data. Findings include normal foveal contour, no IRF, no SRF, outer retinal atrophy, epiretinal membrane (Myopic contour; retinal drusen; trace ERM).   Notes Images taken, stored on drive   Diagnosis / Impression:  Poor quality images due to keratopathy OU OD - nasal VMT with mild edema, no SRF OS - myopic contour; no IRF/SRF Mild ERM OU   Clinical management:  See below  Abbreviations: NFP - Normal foveal profile. CME - cystoid macular edema. PED - pigment epithelial detachment. IRF - intraretinal fluid. SRF - subretinal fluid. EZ - ellipsoid zone. ERM - epiretinal membrane. ORA - outer retinal atrophy. ORT - outer retinal tubulation. SRHM - subretinal hyper-reflective material                ASSESSMENT/PLAN:    ICD-10-CM   1. Intermediate stage nonexudative age-related macular degeneration of both eyes H35.3132   2. Vitreomacular adhesion of right eye H43.821   3. Retinal edema  H35.81 OCT, Retina - OU - Both Eyes  4. Diabetes mellitus type 2 without retinopathy ([place]) E11.9   5. Dry eye syndrome of both eyes H04.123   6. Lagophthalmos of left upper eyelid, unspecified lagophthalmos type H02.204   7. Pseudophakia of both eyes Z96.1   8. 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 Parkinson's disease ([place]) G20     1. Age related macular degeneration, non-exudative, both eyes  - The incidence, anatomy, and pathology of dry AMD, risk of progression, and the AREDS and AREDS 2  study including smoking risks discussed with patient.  - Recommend amsler grid monitoring  - f/u 4 mos  2,3. Vitreomacular adhesion with retinal edema OD  - borderline OCT quality due to severe dry eye  - peripapillary VMT causing mild edema OD  - questionable visual significance -- will monitor for now  4. DM2 w/o retinopathy  - Discussed importance of tight BG and BP control  - A1C - 6-6.5; States CBG is stable  - Monitor annually  5,6. DES OU w/ lagophthalmos OS-   - Severe dryness OU on prn use of ATs -- suspect this is a major contributor to gradual decrease in vision  - Recommend ATs OU q1-2h and lubricating gel/ointment at night  - s/p blepharoplasty OS x2 with Dr. [facility]key at [facility]  - recommend f/u with Dr. Toy Cookey at new practice -- contact information given to pt  7. Pseudophakia OU  - s/p CE/IOL OU  - beautiful surgery, doing well  - monitor  8. Optic neuropathy OD  - small APD detected OD  - extensive history of microvascular disease  - monitor  9. Parkinson's disease  - complicates full evaluation of visual function  - management per primary Neuro doctors    Ophthalmic Meds Ordered this visit:  No orders of the defined types were placed in this encounter.      Return in about 4 months (around 01/12/2018) for F/U ARMD OU.  There are no Patient Instructions on file for this visit.   Explained the diagnoses, plan, and follow up with the patient and they expressed understanding.  Patient expressed understanding of the importance of proper follow up care.   Gardiner Sleeper, M.D., Ph.D. Diseases & Surgery of the Retina and Vitreous [facility] 09/12/17     Abbreviations: M myopia (nearsighted); A astigmatism; H hyperopia (farsighted); P presbyopia; Mrx spectacle prescription;  CTL contact lenses; OD right eye; OS left eye; OU both eyes  XT exotropia; ET esotropia; PEK punctate epithelial keratitis; PEE punctate epithelial  erosions; DES dry eye syndrome; MGD meibomian gland dysfunction; ATs artificial tears; PFAT's preservative free artificial tears; [place] nuclear sclerotic cataract; PSC posterior subcapsular cataract; ERM epi-retinal membrane; PVD posterior vitreous detachment; RD retinal detachment; DM diabetes mellitus; DR diabetic retinopathy; NPDR non-proliferative diabetic retinopathy; PDR proliferative diabetic retinopathy; CSME clinically significant macular edema; DME diabetic macular edema; dbh dot blot hemorrhages; CWS cotton wool spot; POAG primary open angle glaucoma; C/D cup-to-disc ratio; HVF humphrey visual field; GVF goldmann visual field; OCT optical coherence tomography; IOP intraocular pressure; BRVO Branch retinal vein occlusion; CRVO central retinal vein occlusion; CRAO central retinal artery occlusion; BRAO branch retinal artery occlusion; RT retinal tear; SB scleral buckle; PPV pars plana vitrectomy; VH Vitreous hemorrhage; PRP panretinal laser photocoagulation; IVK intravitreal kenalog; VMT vitreomacular traction; MH Macular hole;  NVD neovascularization of the disc; NVE neovascularization elsewhere; AREDS age related eye disease study; ARMD age related macular degeneration; POAG  primary open angle glaucoma; EBMD epithelial/anterior basement membrane dystrophy; ACIOL anterior chamber intraocular lens; IOL intraocular lens; PCIOL posterior chamber intraocular lens; Phaco/IOL phacoemulsification with intraocular lens placement; [place] photorefractive keratectomy; LASIK laser assisted in situ keratomileusis; HTN hypertension; DM diabetes mellitus; COPD chronic obstructive pulmonary disease

## 2017-09-11 ENCOUNTER — Telehealth (INDEPENDENT_AMBULATORY_CARE_PROVIDER_SITE_OTHER): Payer: Self-pay | Admitting: Ophthalmology

## 2017-09-11 ENCOUNTER — Encounter (INDEPENDENT_AMBULATORY_CARE_PROVIDER_SITE_OTHER): Payer: Self-pay | Admitting: Ophthalmology

## 2017-09-11 ENCOUNTER — Ambulatory Visit (INDEPENDENT_AMBULATORY_CARE_PROVIDER_SITE_OTHER): Payer: Medicare Other | Admitting: Ophthalmology

## 2017-09-11 DIAGNOSIS — Z961 Presence of intraocular lens: Secondary | ICD-10-CM

## 2017-09-11 DIAGNOSIS — G20A1 Parkinson's disease without dyskinesia, without mention of fluctuations: Secondary | ICD-10-CM

## 2017-09-11 DIAGNOSIS — H3581 Retinal edema: Secondary | ICD-10-CM

## 2017-09-11 DIAGNOSIS — E119 Type 2 diabetes mellitus without complications: Secondary | ICD-10-CM | POA: Diagnosis not present

## 2017-09-11 DIAGNOSIS — H469 Unspecified optic neuritis: Secondary | ICD-10-CM

## 2017-09-11 DIAGNOSIS — H43821 Vitreomacular adhesion, right eye: Secondary | ICD-10-CM | POA: Diagnosis not present

## 2017-09-11 DIAGNOSIS — H02204 Unspecified lagophthalmos left upper eyelid: Secondary | ICD-10-CM | POA: Diagnosis not present

## 2017-09-11 DIAGNOSIS — G20 Parkinson's disease: Secondary | ICD-10-CM

## 2017-09-11 DIAGNOSIS — H353132 Nonexudative age-related macular degeneration, bilateral, intermediate dry stage: Secondary | ICD-10-CM

## 2017-09-11 DIAGNOSIS — H04123 Dry eye syndrome of bilateral lacrimal glands: Secondary | ICD-10-CM

## 2017-09-11 NOTE — Telephone Encounter (Signed)
Left reminder message appt 09/11/2017 @ 1 pm

## 2017-09-19 DIAGNOSIS — Z23 Encounter for immunization: Secondary | ICD-10-CM | POA: Diagnosis not present

## 2017-09-27 ENCOUNTER — Ambulatory Visit (INDEPENDENT_AMBULATORY_CARE_PROVIDER_SITE_OTHER): Payer: Medicare Other | Admitting: *Deleted

## 2017-09-27 DIAGNOSIS — I4819 Other persistent atrial fibrillation: Secondary | ICD-10-CM

## 2017-09-27 DIAGNOSIS — I481 Persistent atrial fibrillation: Secondary | ICD-10-CM

## 2017-09-27 DIAGNOSIS — I4891 Unspecified atrial fibrillation: Secondary | ICD-10-CM

## 2017-09-27 DIAGNOSIS — Z5181 Encounter for therapeutic drug level monitoring: Secondary | ICD-10-CM

## 2017-09-27 LAB — POCT INR: INR: 3.1

## 2017-09-28 ENCOUNTER — Other Ambulatory Visit: Payer: Self-pay | Admitting: Neurology

## 2017-09-28 DIAGNOSIS — G20 Parkinson's disease: Secondary | ICD-10-CM

## 2017-10-02 ENCOUNTER — Ambulatory Visit: Payer: Medicare Other | Admitting: Neurology

## 2017-10-06 ENCOUNTER — Other Ambulatory Visit: Payer: Self-pay | Admitting: Neurology

## 2017-10-06 DIAGNOSIS — F0280 Dementia in other diseases classified elsewhere without behavioral disturbance: Secondary | ICD-10-CM

## 2017-10-09 ENCOUNTER — Telehealth: Payer: Self-pay | Admitting: Neurology

## 2017-10-09 MED ORDER — MEMANTINE HCL 10 MG PO TABS: 10.0000 mg | ORAL_TABLET | Freq: Two times a day (BID) | ORAL | 2 refills | Status: DC

## 2017-10-09 NOTE — Telephone Encounter (Signed)
Pt called he needs refill for mamentine, he is taking 10mg  bid. She does not remember if the dose was to be increased, he is doing well on current dose. Please call to advise

## 2017-10-09 NOTE — Telephone Encounter (Signed)
Memantine 10 mg BID refilled

## 2017-11-08 ENCOUNTER — Ambulatory Visit (INDEPENDENT_AMBULATORY_CARE_PROVIDER_SITE_OTHER): Payer: Medicare Other | Admitting: *Deleted

## 2017-11-08 DIAGNOSIS — I481 Persistent atrial fibrillation: Secondary | ICD-10-CM | POA: Diagnosis not present

## 2017-11-08 DIAGNOSIS — I4891 Unspecified atrial fibrillation: Secondary | ICD-10-CM | POA: Diagnosis not present

## 2017-11-08 DIAGNOSIS — I4819 Other persistent atrial fibrillation: Secondary | ICD-10-CM

## 2017-11-08 DIAGNOSIS — Z5181 Encounter for therapeutic drug level monitoring: Secondary | ICD-10-CM

## 2017-11-08 LAB — POCT INR: INR: 3

## 2017-11-08 NOTE — Patient Instructions (Signed)
Description   Continue same dosage 1 tablet on all days except 1.5 tablets on Tuesdays. Recheck INR 6 weeks. Continue eating dark green leafy vegetables consistently.  Call us with any meds changes # [phone].

## 2017-11-16 ENCOUNTER — Other Ambulatory Visit: Payer: Self-pay | Admitting: Cardiology

## 2017-11-23 ENCOUNTER — Other Ambulatory Visit: Payer: Self-pay | Admitting: Neurology

## 2017-11-23 DIAGNOSIS — G20 Parkinson's disease: Secondary | ICD-10-CM

## 2017-11-27 ENCOUNTER — Other Ambulatory Visit: Payer: Self-pay

## 2017-11-27 DIAGNOSIS — G20 Parkinson's disease: Secondary | ICD-10-CM

## 2017-11-27 MED ORDER — CARBIDOPA-LEVODOPA ER 50-200 MG PO TBCR: 1.0000 | EXTENDED_RELEASE_TABLET | Freq: Four times a day (QID) | ORAL | 3 refills | Status: AC

## 2017-11-30 ENCOUNTER — Other Ambulatory Visit: Payer: Self-pay | Admitting: Cardiology

## 2017-12-02 DIAGNOSIS — G5133 Clonic hemifacial spasm, bilateral: Secondary | ICD-10-CM | POA: Diagnosis not present

## 2017-12-02 DIAGNOSIS — G5139 Clonic hemifacial spasm, unspecified: Secondary | ICD-10-CM | POA: Diagnosis not present

## 2017-12-05 ENCOUNTER — Ambulatory Visit (HOSPITAL_COMMUNITY)
Admission: RE | Admit: 2017-12-05 | Discharge: 2017-12-05 | Disposition: A | Discharge status: Home / Self Care | Payer: Medicare Other | Source: Ambulatory Visit | Attending: Family | Admitting: Family

## 2017-12-05 ENCOUNTER — Ambulatory Visit (INDEPENDENT_AMBULATORY_CARE_PROVIDER_SITE_OTHER): Payer: Medicare Other | Admitting: Family

## 2017-12-05 ENCOUNTER — Encounter: Payer: Self-pay | Admitting: Family

## 2017-12-05 VITALS — BP 140/88 | HR 66 | Temp 98.3°F | Resp 20 | Ht 67.0 in | Wt 200.0 lb

## 2017-12-05 DIAGNOSIS — I714 Abdominal aortic aneurysm, without rupture, unspecified: Secondary | ICD-10-CM

## 2017-12-05 DIAGNOSIS — Z95828 Presence of other vascular implants and grafts: Secondary | ICD-10-CM | POA: Diagnosis not present

## 2017-12-05 DIAGNOSIS — R0989 Other specified symptoms and signs involving the circulatory and respiratory systems: Secondary | ICD-10-CM | POA: Diagnosis not present

## 2017-12-05 NOTE — Progress Notes (Signed)
[facility] [place]  CC: Follow up s/p Endovascular Repair of Abdominal Aortic Aneurysm    History of Present Illness  Troy [name] is a 77 y.o. (05-10-1941) male who returns for continued follow-up regarding his abdominal aortic aneurysm stent graft which Dr. Kellie Simmering performed in 2011. In the past he has had a small endoleak but the aneurysm sac has been quite stable and slightly over 5 cm. He denies any new abdominal or back symptoms. He does not ambulate much. He does have edema in both lower extremities he reports. There are no other new symptoms.  Dr. Lendon Ka at [facility] is performing regular injections of left side facial nerve spasms since about 2012; he denies any hx of stroke or TIA, denies a hx of Bell's Palsy.  [facility] provides his Neurology care. Wife states pt has Parkinson's Disease, does work with a Clinical research associate twice/week. He started using boxing about 2014 to help his Parkinson's Disease, "[employer] Gum, [employer]".  He had a CABG in September, 2010; this was a result of routine cardiac work up in preparation for the CEA and CABG had to be performed first; pt was asymptomatic, denies any hx of MI. AAA was found incidentally.    He takes coumadin for atrial fib.  Pt Diabetic: Yes, diet controlled, wife states last A1C stays under 7.0 Pt smoker: former smoker, quit in 2009, started in his 20's    Past Medical History:  Diagnosis Date  . AAA (abdominal aortic aneurysm) ([place])    with mural thrombus  . Chronic anticoagulation   . CKD (chronic kidney disease) stage 2, GFR 60-89 ml/min   . Constipation   . Coronary artery disease 07/2009   severe 3 vessel ASCAD s/p CABG with LIMA to LAD, SVG to diag, SVG to PL  . Dementia   . Diabetes mellitus   . Diverticulosis   . Edema extremities 09/03/2017  . Facial nerve spasm    L eye, tx'd with botox  . Fecal impaction ([place])   . Gout   . Hepatic steatosis   . Hyperlipidemia    . Hypertension   . Hypothyroidism   . OSA (obstructive sleep apnea)    intolerant to CPAP followed by Dr. Lamonte Sakai  . Parkinsonism ([place])   . Persistent atrial fibrillation [facility])    Past Surgical History:  Procedure Laterality Date  . ABDOMINAL AORTIC ANEURYSM REPAIR  01-2010   aorto-bi-iliac Gore Excluder stent graft  . CORONARY ARTERY BYPASS GRAFT  07-2009  . EYE SURGERY Left May  2013   Cataract  . EYE SURGERY Right June 2013   Cataract  . SPINE SURGERY  1998   Social History Social History   Tobacco Use  . Smoking status: Former Smoker    Years: 15.00    Types: Cigarettes    Last attempt to quit: 10/07/2008    Years since quitting: 9.1  . Smokeless tobacco: Never Used  Substance Use Topics  . Alcohol use: Yes    Alcohol/week: 1.2 - 1.8 oz    Types: 2 - 3 Glasses of wine per week    Comment: A week  . Drug use: No   Family History Family History  Problem Relation Age of Onset  . Heart disease Mother        CHF  . Alzheimer's disease Mother   . Alzheimer's disease Father   . Alzheimer's disease Brother   . Alzheimer's disease Paternal Uncle    Current  Outpatient Medications on File Prior to Visit  Medication Sig Dispense Refill  . amLODipine (NORVASC) 10 MG tablet TAKE ONE TABLET BY MOUTH ONCE DAILY 90 tablet 3  . carbidopa-levodopa (SINEMET CR) 50-200 MG tablet Take 1 tablet by mouth 4 (four) times daily. 7, 11, 3 pm and 7 pm 360 tablet 3  . carboxymethylcellulose (REFRESH PLUS) 0.5 % SOLN Place 1 drop into both eyes 3 (three) times daily as needed (dry eyes).    . Cholecalciferol (VITAMIN D-3) 1000 UNITS CAPS Take 2,000 Units by mouth daily.     . Cinnamon 500 MG capsule Take 1,000 mg by mouth daily.     . clindamycin (CLEOCIN) 150 MG capsule     . clonazePAM (KLONOPIN) 0.25 MG disintegrating tablet DISSOLVE 2 TABLETS IN MOUTH AT BEDTIME 60 tablet 5  . DHA-EPA-Vitamin E 858-850-2 MG-MG-UNIT CAPS Take 1 capsule by mouth 2 (two) times daily as needed (dry eyes).      Marland Kitchen donepezil (ARICEPT) 10 MG tablet TAKE ONE TABLET BY MOUTH ONCE DAILY AT BEDTIME 90 tablet 1  . EPIPEN 2-PAK 0.3 MG/0.3ML SOAJ Inject 0.3 mg into the muscle once as needed. Allergic reaction    . fluticasone (FLONASE) 50 MCG/ACT nasal spray Place 2 sprays into the nose daily as needed for allergies.     Marland Kitchen guaiFENesin (MUCINEX) 600 MG 12 hr tablet Take 2 tablets (1,200 mg total) by mouth 2 (two) times daily. 10 tablet 0  . levothyroxine (SYNTHROID, LEVOTHROID) 125 MCG tablet Take 125 mcg by mouth daily.      Marland Kitchen lisinopril (PRINIVIL,ZESTRIL) 40 MG tablet TAKE 1 TABLET BY MOUTH ONCE DAILY 90 tablet 2  . memantine (NAMENDA) 10 MG tablet Take 1 tablet (10 mg total) 2 (two) times daily by mouth. 60 tablet 2  . OVER THE COUNTER MEDICATION Take 1 tablet by mouth 2 (two) times daily. Macular degeneration supplement    . OVER THE COUNTER MEDICATION Take 1 capsule by mouth daily. Homocysteine supplement    . rosuvastatin (CRESTOR) 5 MG tablet Take 1 tablet (5 mg total) by mouth daily. 90 tablet 3  . warfarin (COUMADIN) 5 MG tablet TAKE AS DIRECTED BY COUMADIN CLINIC 40 tablet 3   No current facility-administered medications on file prior to visit.    Allergies  Allergen Reactions  . Bee Venom Anaphylaxis  . Other Anaphylaxis    Other reaction(s): Laryngeal Edema (ALLERGY) "a heart medication" ? unknown  . Tamsulosin Other (See Comments)    Insomnia  . Penicillins Rash  . Tetanus Toxoids Rash     ROS: See HPI for pertinent positives and negatives.  Physical Examination  Vitals:   12/05/17 0831  BP: 140/88  Pulse: 66  Resp: 20  Temp: 98.3 F (36.8 C)  TempSrc: Oral  SpO2: 96%  Weight: 200 lb (90.7 kg)  Height: 5\' 7"  (1.702 m)   Body mass index is 31.32 kg/m.  General:A&O x 1 (to person only), WD, obese male.  Pulmonary: Sym exp, respirations are non labored, good air movt, CTAB, no rales, rhonchi, or wheezing.  Cardiac: Irregular rhythm, regular rate, no murmur  appreciated  Vascular: Vessel Right Left  Radial 2+Palpable 2+Palpable  Carotid  without bruit  without bruit  Aorta Not palpable N/A  Femoral 2+ Palpable 2+Palpable  Popliteal 2+ palpable 2+ palpable  PT 2+Palpable 2+Palpable  DP Not Palpable Not Palpable   Gastrointestinal: soft, NTND, -G/R, - HSM, small asymptomatic ventral and umbilical hernia, - CVAT B.  Musculoskeletal: M/S 5/5 throughout,  extremities without ischemic changes.Bilateral pretibial pitting edema: 1+ right, 2+ left.   Skin: No rash, no cellulitis, no ulcers noted.   Neurologic: Pain and light touch intact in extremities, Motor exam as listed above. Left facial droop    DATA  EVAR Duplex (Date: 12/05/17):  AAA sac size: 5.0 cm; Right CIA: 1.5 cm; Left CIA: 1.8 cm  no endoleak detected Previous: (Date: 12-04-2016) AAA sac size: 4.2 cm x 5.2 cm; Right CIA: 1.2 cm; Left CIA: 1.4 cm   Medical Decision Making  Troy [name] is a 76 y.o. male who presents s/p EVAR (Date: 2011).  Pt is asymptomatic with stable sac size.  I discussed with the patient and his wife the importance of surveillance of the endograft. He had a CT of his EVAR at the time he was evaluated by Dr. Kellie Simmering in January 2017.    The next endograft duplex will be scheduled for 12 months.  The patient will follow up with [place] in 12 months with these studies and bilateral popliteal artery duplex to evaluate prominent popliteal pulses.   I emphasized the importance of maximal medical management including strict control of blood pressure, blood glucose, and lipid levels, antiplatelet agents, obtaining regular exercise, and cessation of smoking.   Thank you for allowing [place] to participate in this patient's care.  Clemon Chambers, RN, MSN, FNP-C [facility] [place] Office: [phone]  Clinic Physician: Oneida Alar  12/05/2017, 8:50 AM

## 2017-12-20 ENCOUNTER — Ambulatory Visit (INDEPENDENT_AMBULATORY_CARE_PROVIDER_SITE_OTHER): Payer: Medicare Other

## 2017-12-20 DIAGNOSIS — I4891 Unspecified atrial fibrillation: Secondary | ICD-10-CM

## 2017-12-20 DIAGNOSIS — Z5181 Encounter for therapeutic drug level monitoring: Secondary | ICD-10-CM

## 2017-12-20 DIAGNOSIS — I4819 Other persistent atrial fibrillation: Secondary | ICD-10-CM

## 2017-12-20 DIAGNOSIS — I481 Persistent atrial fibrillation: Secondary | ICD-10-CM

## 2017-12-20 LAB — POCT INR: INR: 2.5

## 2017-12-20 NOTE — Patient Instructions (Signed)
Description   Continue on same dosage 1 tablet daily except 1.5 tablets on Tuesdays. Recheck INR 6 weeks. Continue eating dark green leafy vegetables consistently.  Call us with any meds changes # [phone].

## 2017-12-28 ENCOUNTER — Other Ambulatory Visit: Payer: Self-pay | Admitting: Neurology

## 2017-12-28 ENCOUNTER — Other Ambulatory Visit: Payer: Self-pay | Admitting: Cardiology

## 2018-01-08 DIAGNOSIS — H02206 Unspecified lagophthalmos left eye, unspecified eyelid: Secondary | ICD-10-CM | POA: Diagnosis not present

## 2018-01-08 DIAGNOSIS — H04123 Dry eye syndrome of bilateral lacrimal glands: Secondary | ICD-10-CM | POA: Diagnosis not present

## 2018-01-08 DIAGNOSIS — H16212 Exposure keratoconjunctivitis, left eye: Secondary | ICD-10-CM | POA: Diagnosis not present

## 2018-01-08 DIAGNOSIS — H40013 Open angle with borderline findings, low risk, bilateral: Secondary | ICD-10-CM | POA: Diagnosis not present

## 2018-01-13 ENCOUNTER — Encounter: Payer: Self-pay | Admitting: Neurology

## 2018-01-13 ENCOUNTER — Ambulatory Visit (INDEPENDENT_AMBULATORY_CARE_PROVIDER_SITE_OTHER): Payer: Medicare Other | Admitting: Neurology

## 2018-01-13 VITALS — BP 137/86 | HR 59 | Ht 67.0 in | Wt 201.0 lb

## 2018-01-13 DIAGNOSIS — F0280 Dementia in other diseases classified elsewhere without behavioral disturbance: Secondary | ICD-10-CM

## 2018-01-13 DIAGNOSIS — G5139 Clonic hemifacial spasm, unspecified: Secondary | ICD-10-CM

## 2018-01-13 DIAGNOSIS — G20 Parkinson's disease: Secondary | ICD-10-CM

## 2018-01-13 MED ORDER — CLONAZEPAM 0.25 MG PO TBDP: ORAL_TABLET | ORAL | 5 refills | Status: AC

## 2018-01-13 MED ORDER — MEMANTINE HCL 10 MG PO TABS: 10.0000 mg | ORAL_TABLET | Freq: Two times a day (BID) | ORAL | 5 refills | Status: AC

## 2018-01-13 MED ORDER — DONEPEZIL HCL 10 MG PO TABS: 10.0000 mg | ORAL_TABLET | Freq: Every day | ORAL | 3 refills | Status: AC

## 2018-01-13 NOTE — Patient Instructions (Addendum)
Constipation can be a big problem in advanced Parkinson's disease. It is important to be proactive: this includes ensuring adequate water intake and mobilization (walking around), utilizing stool softeners as needed, an over-the-counter laxative as needed up to daily if needed, adding a probiotic in pill form or in the form of yogurt can help as well. Sometimes, using a suppository or enema becomes necessary.   Please try to use a urinal at night to avoid having to get up so frequently and as you take clonazepam at bedtime.   Maybe you should take the clonazepem around 9:30 or 10 PM and plan to be in bed earlier than 11 or 11:30 PM.   We will monitor your hallucinations.   We will keep your Sinemet CR the same.

## 2018-01-13 NOTE — Progress Notes (Signed)
Subjective:    Patient ID: Troy [name] is a 77 y.o. male.  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 Which I saw him for a new problem visit on 08/05/2017. He is accompanied by his wife again today. I last saw him on 08/05/2017, at which time he was requested to follow-up for a sooner appointment by his eye doctor. He had an abnormality on his visual field exam. He reported not being able to respond as quickly for the visual field testing and he had a flareup of his hemifacial spasm at the time, felt rushed and under stress. Nevertheless, I suggested we proceed with a brain MRI. He had an interim brain MRI without contrast on 08/17/2017 which I reviewed: IMPRESSION:  This MRI of the brain without contrast shows the following: 1.    Moderately severe cortical atrophy that is a little more pronounced in the mesial temporal lobes. Corpus callosum atrophy is also noted. The extent of atrophy has slightly progressed when compared to the 2013 MRI. 2.    There is a small remote left parietal stroke unchanged when compared to the previous MRI. There are some punctate T2/FLAIR hyperintense foci consistent with chronic microvascular ischemic change. 3.    Stable hemosiderin deposition in the subependymal/periventricular region of the right lateral ventricle on unknown significance table when compared to the 2013 MRI. There is a microhemorrhage in  the right frontal lobe not present on the previous MRI of doubtful significance in isolation. 4.    Stable appearing left parotid cystic mass.   We called his wife with the test results.   Today, 01/13/2018 (all dictated new, as well as above notes, some dictation done in note pad or Word, outside of chart, may appear as copied):  He reports doing okay for the most part, no recent flareup in his mobility issues, tries to stay active, goes to exercise with a personal trainer at [employer] twice a week and participates in the Parkinson's disease boxing class twice a week. This adds good socialization and bonding with other patients. His wife goes with him and speaks very highly unfavorably of these programs. He continues to take Sinemet CR generic 4 times a day, generic Namenda 10 mg twice daily, generic Aricept 10 mg once daily, clonazepam 0.5 mg total dose at bedtime which is typically around 11 or 11:30 PM. He has had sleep disruption and tends to wake up multiple times in the middle of the night, he seems quite confused at times but overall clonazepam has been very helpful in reducing dream enactments per wife. He has had more urinary incontinence issues. Constipation tends to fluctuate.   The patient's allergies, current medications, family history, past medical history, past social history, past surgical history and problem list were reviewed and updated as appropriate.   Previously (copied from previous notes for reference):   08/05/2017: He denies sudden onset of blindness or partial blindness. He saw [name] on 08/01/2017 and I reviewed her note. She reports visual fields show progressing right hemifield defects. His last  brain MRI that I could pull up in our system was from 11/13/2012, ordered by Dr. Melton Alar:  IMPRESSION: 1. No acute intracranial abnormality.  Stable noncontrast MRI appearance of the brain since 2011 except for some generalized volume loss. 2.  New 2 cm fluid collection/cystic  mass identified in the left submandibular region, only partially visible.  Etiology and significance unclear. Neck CT (IV contrast preferred) should be able to characterize further and could be compared to a prior in 2006.   The patient's allergies, current medications, family history, past medical history, past social history, past surgical history and problem list were reviewed and updated as appropriate.     I saw him on 07/11/2017, at which time he was worried about his memory. He had issues with constipation. He was taking part in the boxing class for Parkinson's patients and was also working out with a trainer twice a week. Overall, he was fairly stable. He was taking Namenda generic twice daily and generic Aricept once daily, Sinemet CR one pill 4 times a day and clonazepam 0.5 mg at night. We had reduced this because of a recent fall. He was hospitalized earlier this year in February 2018 for altered mental status in the context of recurrent maxillary sinusitis and influenza B.      I saw him on 01/08/2017, at which time he was on antibiotics for sinus infection. He had a couple falls. He was on Sinemet CR one pill 4 times a day. Namenda long-acting was too costly. His MMSE was 13. I suggested we switch him to generic Namenda 10 mg twice a day, continue with Aricept 10 mg daily, and reduce his clonazepam due to fall risk.    I saw him on 09/06/2016, at which time he reported doing okay, he had some daytime somnolence which was about the same, he came off of Comtan, he was on Sinemet CR 4 times a day but was complaining of sleepiness from it. He did not sleep very well at night. He was using a dental appliance for sleep apnea minute was bothering him. He was on clonazepam 0.5 mg each night. He was following with cardiology and also had an appointment with his dentist first dental appliance. I suggested we increase his clonazepam to 0.75 mg each night, he was advised to take Sinemet CR one pill  4 times a day at 7, 11, 3 PM and 7 PM. I suggested we increase his long-acting Namenda to 21 mg daily.      I saw him on 05/03/2016, at which time he reported doing fairly well. His wife reported that he was tolerating Namenda. He was quite sleepy during the day. He was not able to tolerate Sinemet immediate release and we went back to Sinemet CR 3 times a day along with Comtan. Comtan generic was very expensive. Memory was stable per the report. He was complaining of erectile dysfunction. MMSE was 17 out of 30, clock drawing was 1 out of 4 and animal fluency was 3/m, fairly stable findings. I suggested we increase Sinemet CR to 1 pill 4 times a day and stop the Comtan. He was encouraged to increase Namenda long-acting to 14 mg once daily. I continued his clonazepam.   I saw him on 12/29/2015, at which time he reported feeling okay. His wife reported more memory issues, including forgetfulness and at times confusion. He has been on Aricept generic for at least 3 years. He has been taking Sinemet CR 3 times a day along  with Comtan generic. Was active physically and would work out with a trainer twice a week but not so much in between. I suggested he continue with his Parkinson's medication but asked him to start on Namenda long-acting 7 mg once daily for memory loss. His MMSE was 18 out of 30 at the time.   I saw him on 08/23/2015, at which time he reported deterioration of his speech. He was in outpatient PT, OT and ST. She felt that his memory was worse. He did not do well with carbidopa-levodopa 4 times a day and his wife called on 03/31/2015 to change it back to CR. He was back on Sinemet CR 50-200 milligrams one tablet 3 times a day. He was exercising at the gym twice a day. He had finished LSVT-BIG at neuro rehab. He was on Aricept and had been on it for about 3 years. He was sleeping fairly well. Unfortunately, his wife had some surgical complications and medical setbacks. I asked him to continue with  Aricept generic 10 mg once daily, Sinemet CR one tablet 3 times a day, and Comtan 3 times a day. He was also on clonazepam 0.5 mg at bedtime. I asked him to monitor his leg swelling and start using compression stockings to his legs.   I saw him on 03/07/2015, at which time he asked whether he could drive. His wife was supposed to have knee replacement surgery in July 2016 and he needed a way to get to his therapy appointments. He had benefit from outpatient occupational, physical and speech therapy in the past. He was working with a Physiological scientist 2 times a week as well. He had fallen in the past repeatedly. He felt his memory was about the same. He had some more daytime somnolence. He felt that this happened especially after taking Sinemet. He was taking Sinemet long-acting 3 times a day, 50-200 milligrams strength. He was taking Comtan 3 times a day. He had not changed his medication regimen well. He had no recent falls. His left hemifacial spasm had remained the same. He was sleeping a little better after starting clonazepam. I suggested we switch him to immediate release Sinemet, 25-100 milligrams strength, 1 pill 4 times a day and I suggested we continue Comtan 200 mg 3 times a day. I suggested he start using lower extremity compression stockings to help with the pressure maintenance and lower extremity swelling reduction. I asked him no longer to drive.   I first met him on 08/23/2014 at the request of Dr. Erlinda Hong, at which time the patient reported 5 year history of parkinsonism. I kept him on the current medication regimen. 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    He was diagnosed with parkinsonism about 5 years ago. He was placed on Sinemet and continues to take Sinemet ER 50-200 mg strength 3 times a day. Comtan was added recently about 4 months ago. 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 Maudry Mayhew for hemifacial spasm. Previous workup included MRI brain last year showing brain atrophy and small vessel white matter changes. 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 He also had LP done in January 2015 which did not show evidence of NPH, and the CSF was clean.   He noted more difficulty with his gait after stopping the comtan, so he  re-started it. His sleep is better. He has had some memory loss. He has been on Aricept for about 2 years and memory has been stable. He has no VH, or AH. He has no mood issues.     His Past Medical History Is Significant For: Past Medical History:  Diagnosis Date  . AAA (abdominal aortic aneurysm) ([place])    with mural thrombus  . Chronic anticoagulation   . CKD (chronic kidney disease) stage 2, GFR 60-89 ml/min   . Constipation   . Coronary artery disease 07/2009   severe 3 vessel ASCAD s/p CABG with LIMA to LAD, SVG to diag, SVG to PL  . Dementia   . Diabetes mellitus   . Diverticulosis   . Edema extremities 09/03/2017  . Facial nerve spasm    L eye, tx'd with botox  . Fecal impaction ([place])   . Gout   . Hepatic steatosis   . Hyperlipidemia   . Hypertension   . Hypothyroidism   . OSA (obstructive sleep apnea)    intolerant to CPAP followed by Dr. Lamonte Sakai  . Parkinsonism ([place])   . Persistent atrial fibrillation ([place])     His Past Surgical History Is Significant For: Past Surgical History:  Procedure Laterality Date  . ABDOMINAL AORTIC ANEURYSM REPAIR  01-2010   aorto-bi-iliac Gore Excluder stent graft  . CORONARY ARTERY BYPASS GRAFT  07-2009  . EYE SURGERY Left May  2013   Cataract  . EYE SURGERY Right June 2013   Cataract  . [place]    His Family History Is Significant For: Family History  Problem Relation Age of Onset  . Heart disease Mother        CHF  . Alzheimer's disease Mother   . Alzheimer's disease  Father   . Alzheimer's disease Brother   . Alzheimer's disease Paternal Uncle     His Social History Is Significant For: Social History   Socioeconomic History  . Marital status: Married    Spouse name: None  . Number of children: 3  . Years of education: BS  . Highest education level: None  Social Needs  . Financial resource strain: None  . Food insecurity - worry: None  . Food insecurity - inability: None  . Transportation needs - medical: None  . Transportation needs - non-medical: None  Occupational History  . Occupation: retired  Tobacco Use  . Smoking status: Former Smoker    Years: 15.00    Types: Cigarettes    Last attempt to quit: 10/07/2008    Years since quitting: 9.2  . Smokeless tobacco: Never Used  Substance and Sexual Activity  . Alcohol use: Yes    Alcohol/week: 1.2 - 1.8 oz    Types: 2 - 3 Glasses of wine per week    Comment: A week  . Drug use: No  . Sexual activity: No  Other Topics Concern  . None  Social History Narrative   Patient lives at home with his wife   Patient is right handed   Patient drinks coffee    His Allergies Are:  Allergies  Allergen Reactions  . Bee Venom Anaphylaxis  . Other Anaphylaxis    Other reaction(s): Laryngeal Edema (ALLERGY) "a heart medication" ? unknown  . Tamsulosin Other (See Comments)    Insomnia  . Penicillins Rash  . Tetanus Toxoids Rash  :   His Current Medications Are:  Outpatient Encounter Medications as of 01/13/2018  Medication Sig  . amLODipine (NORVASC)  10 MG tablet TAKE ONE TABLET BY MOUTH ONCE DAILY  . carbidopa-levodopa (SINEMET CR) 50-200 MG tablet Take 1 tablet by mouth 4 (four) times daily. 7, 11, 3 pm and 7 pm  . carboxymethylcellulose (REFRESH PLUS) 0.5 % SOLN Place 1 drop into both eyes 3 (three) times daily as needed (dry eyes).  . Cholecalciferol (VITAMIN D-3) 1000 UNITS CAPS Take 2,000 Units by mouth daily.   . Cinnamon 500 MG capsule Take 1,000 mg by mouth daily.   . clindamycin  (CLEOCIN) 150 MG capsule   . clonazePAM (KLONOPIN) 0.25 MG disintegrating tablet DISSOLVE 2 TABLETS IN MOUTH AT BEDTIME  . DHA-EPA-Vitamin E 568-127-5 MG-MG-UNIT CAPS Take 1 capsule by mouth 2 (two) times daily as needed (dry eyes).   Marland Kitchen donepezil (ARICEPT) 10 MG tablet Take 1 tablet (10 mg total) by mouth daily with breakfast.  . EPIPEN 2-PAK 0.3 MG/0.3ML SOAJ Inject 0.3 mg into the muscle once as needed. Allergic reaction  . fluticasone (FLONASE) 50 MCG/ACT nasal spray Place 2 sprays into the nose daily as needed for allergies.   Marland Kitchen guaiFENesin (MUCINEX) 600 MG 12 hr tablet Take 2 tablets (1,200 mg total) by mouth 2 (two) times daily.  Marland Kitchen levothyroxine (SYNTHROID, LEVOTHROID) 125 MCG tablet Take 125 mcg by mouth daily.    Marland Kitchen lisinopril (PRINIVIL,ZESTRIL) 40 MG tablet TAKE 1 TABLET BY MOUTH ONCE DAILY  . memantine (NAMENDA) 10 MG tablet Take 1 tablet (10 mg total) by mouth 2 (two) times daily.  Marland Kitchen OVER THE COUNTER MEDICATION Take 1 tablet by mouth 2 (two) times daily. Macular degeneration supplement  . OVER THE COUNTER MEDICATION Take 1 capsule by mouth daily. Homocysteine supplement  . rosuvastatin (CRESTOR) 5 MG tablet Take 1 tablet (5 mg total) by mouth daily.  Marland Kitchen warfarin (COUMADIN) 5 MG tablet TAKE AS DIRECTED BY COUMADIN CLINIC  . [DISCONTINUED] clonazePAM (KLONOPIN) 0.25 MG disintegrating tablet DISSOLVE 2 TABLETS IN MOUTH AT BEDTIME  . [DISCONTINUED] donepezil (ARICEPT) 10 MG tablet TAKE ONE TABLET BY MOUTH ONCE DAILY AT BEDTIME  . [DISCONTINUED] memantine (NAMENDA) 10 MG tablet TAKE 1 TABLET BY MOUTH TWICE DAILY   No facility-administered encounter medications on file as of 01/13/2018.   :  Review of Systems:  Out of a complete 14 point review of systems, all are reviewed and negative with the exception of these symptoms as listed below:  Review of Systems  Neurological:       Pt presents today to follow up on his PD. Pt is complaining of aphasia.    Objective:  Neurological  Exam  Physical Exam Physical Examination:   Vitals:   01/13/18 1256  BP: 137/86  Pulse: (!) 59    General Examination: The patient is a very pleasant 77 y.o. male in no acute distress. He appears well-developed and well-nourished and well groomed. He is quieter today. He is able to respond appropriately.  HEENT:Normocephalic, atraumatic, pupils are equal, round and reactive to light and accommodation. Extraocular tracking shows moderate saccadic breakdown without nystagmus noted. He is s/p cataract repairs. There is limitation to upper gaze. There is a very mild decrease in eye blink rate. L eyelid is droopy, slightly better, s/p eyelid surgery. Hearing is impaired mildly. Face is asymmetric with moderate facial masking and normal facial sensation, but evidence of L hemifacial spasm (he had a parotid tumor removed). There is no lip, neck or jaw tremor. Neck is mildly rigid with intact passive ROM. Oropharynx exam reveals moderate mouth dryness, marginal dental hygiene.Tongue protrudes  centrally and palate elevates symmetrically. There is mild drooling.   Chest:is clear to auscultation without wheezing, rhonchi or crackles noted.  Heart:sounds are regular and normal without murmurs, rubs or gallops noted.   Abdomen:is soft, non-tender and non-distended with normal bowel sounds appreciated on auscultation.  Extremities:There is 1+ pitting edema in the distal lower extremities bilaterally, right worse than left. Left hand is puffier than right today.  Skin: is warm and dry with no trophic changes noted. Age-related changes are noted on the skin.   Musculoskeletal: exam reveals no obvious joint deformities, tenderness, joint swelling or erythema.  Neurologically:  Mental status: The patient is awake and alert, paying fair attention. He is able to provide very little ofthe history. His wife provides most of the history. His memory, attention, language and knowledge are impaired.  There is no aphasia, agnosia, apraxia or anomia. There is a mild degree of bradyphrenia. Speech is moderately hypophonic and mild to moderately dysarthric. Mood is congruent and affect is normal.   On 12/29/2015: MMSE: 18/30, CDT: 1/4, AFT: 4/min.  On 05/03/2016: MMSE: 17/30, CDT: 1/4, AFT: 3/min.  On 01/08/2017: MMSE: 13/30, CDT: 1/4, AFT: 3/min.   On 07/11/2017: MMSE: 12/30, CDT: 1/4, AFT: 4/min.   On 01/13/2018: MMSE: 10/30, CDT: 0/4, AFT: 5/min.  Cranial nerves are as described above under HEENT exam. In addition, shoulder shrug is normal with equal shoulder height noted.  Motor exam: Normal bulk, and global strength of 4+/5. No dyskinesias noted.Tone is globally mildly rigid, no significant cogwheeling is noted, overall mild bradykinesia, stable findings, mild intermittent tremor both upper extremities today. Romberg is not testable safely as he has a tendency to stand wide-based and I did not want to risk it. Reflexes are 1+ in the upper extremities and 2+ in the lower extremities. Findings motor skills are globally moderately impaired, no significant lateralization, perhaps slightly worse on the left but no telltale differences.  Cerebellar testing shows no dysmetria or intention tremor.   Sensory exam is intact to light touch in the upper and lower extremities.   Gait, station and balance:He stands up from the seated position with moderate difficulty and takes more than 2 attempts to stand, no assistance really needed. He has a tendency to veer backwards. He can self-correct. He has mild to moderately stooped posture, no significant lean to one side, he walks slowly, decreased arm swing bilaterally. Mild difficulty turning, tandem walk is not possible, balance impaired, slightly worse.   Assessment and Plan:   In summary, Troy [name] a very pleasant 77 year old malewith an underlying complex medical history of diabetes, hypertension, heart disease, AAA, history of tremors,  hepatic steatosis, coronary artery disease, status post open heart surgery with CABG, history of proximal A. fib, on chronic anticoagulation with Coumadin, history of left hemifacial spasm with treatment of Botox, chronic kidney disease, OSA with intolerance to CPAP in the past and treatment with oral appliance, diverticulosis, gout, hyperlipidemia, who presents for follow-up consultation of his dementia and parkinsonism as well as visual field defect noted during his eye exam. He had an interim brain MRI which showed no significant new changes such as acute changes in keeping with a stroke. He's had more progression in his vascular changes and more progression and has atrophy. Of note, he is stable on Sinemet CR 4 times a day. We will continue with his memory medications, we will continue with his Sinemet at the current dosing and clonazepam at night. I have encouraged him to try  to take the clonazepam a little earlier in the night and try to keep a bedtime of around 10 PM or so. He is advised to try a urinal at night instead of trying to go to the bathroom especially so soon after taking the clonazepam he may be off balance and confused. He's had some intermittent visual hallucinations which are not new and tolerable, not scary at this time. We will continue to monitor. He was on Comtan and long-acting Sinemet, could not tolerate the immediate release Sinemet. We have been able to take him off the Comtan as of June 2017 without significant repercussions. He has been stable on Sinemet long-acting 1 pill 4 times a day. He was on long-acting Namenda 14 mg once daily, we increased this in October 2017 to 21 mg once daily. He has a history of REM behavior disorder and we increased his clonazepam in October 2017 from 0.5 mg to 0.75 mg each night and while he slept a little better and had less dream enactments, he started having more falls and at his visit in February 2018 I suggested we scale back on the clonazepam to  0.5 mg each night. We switched him also to generic Namenda 10 mg twice a day secondary to cost. We mutually agreed to continue with his medication regimen. We talked about fall risk again today. His memory scores has declined with time but are sometimes difficult to interpret as he is also slow in responding. He has had no acute or new visual symptoms. I suggested a 6 month follow-up, sooner as needed. I answered all their questions today and the patient and his wife were in agreement. I spent 30 minutes in total face-to-face time with the patient, more than 50% of which was spent in counseling and coordination of care, reviewing test results, reviewing medication and discussing or reviewing the diagnosis of PD, dementia, the prognosis and treatment options. Pertinent laboratory and imaging test results that were available during this visit with the patient were reviewed by me and considered in my medical decision making (see chart for details).

## 2018-01-14 NOTE — Progress Notes (Signed)
[facility] Note  01/15/2018     CHIEF COMPLAINT Patient presents for Retina Follow Up   HISTORY OF PRESENT ILLNESS: Troy [name] is a 77 y.o. male who presents to the clinic today for:   HPI    Retina Follow Up    Patient presents with  Dry AMD.  In both eyes.  This started 1 year ago.  Severity is mild.  Since onset it is stable.  I, the attending physician,  performed the HPI with the patient and updated documentation appropriately.          Comments    F/U NON EXU AMD OU. Patient is here with his wife by his side,Troy [name] states patient seen Dr. Herbert [name] 01/08/18, upon exam pt was told his visual field looked ok.Pt is scheduled to see Dr. Charletta [name] 02/19/18. Pt is taking Macular Degeneration po QD, Omega 3, B Complex, Vit D3. Pt is using Refresh gtt Prn       Last edited by Troy Caffey, MD on 01/16/2018 10:25 AM. (History)    Pt wife states he is having more cognitive issues; Pt wife reports he has not noted any VA decrease or change since being seen last; Pt wife reports he is using refresh OU prn; Pt wife reports he saw Dr. Herbert [name] last week and HVF had improved, and dryness improved; Pt wife states he has follow up appointment with Dr. Herbert [name] in July and is seeing Dr. Toy [name] in March on the 27th;   Referring physician: Jonathon Jordan, MD [place] [address], [place] [address]  HISTORICAL INFORMATION:   Selected notes from the MEDICAL RECORD NUMBER Referral from Dr. Raliegh Ip. 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 Herbert [name] 2013); Yag Cap OU; blepharoplasty OU (2016); diplopia; DES OU; Ptosis OU; PVD OU;       S/p YAG cap OU 2015; s/p blepharoplasty OS 2015 and 2016 (Dr. Toy [name]) PMH- HTN; DM type II (dx 2012; diet controlled);    CURRENT MEDICATIONS: Current Outpatient Medications (Ophthalmic Drugs)  Medication Sig  . carboxymethylcellulose  (REFRESH PLUS) 0.5 % SOLN Place 1 drop into both eyes 3 (three) times daily as needed (dry eyes).   No current facility-administered medications for this visit.  (Ophthalmic Drugs)   Current Outpatient Medications (Other)  Medication Sig  . amLODipine (NORVASC) 10 MG tablet TAKE ONE TABLET BY MOUTH ONCE DAILY  . carbidopa-levodopa (SINEMET CR) 50-200 MG tablet Take 1 tablet by mouth 4 (four) times daily. 7, 11, 3 pm and 7 pm  . Cholecalciferol (VITAMIN D-3) 1000 UNITS CAPS Take 2,000 Units by mouth daily.   . Cinnamon 500 MG capsule Take 1,000 mg by mouth daily.   . clindamycin (CLEOCIN) 150 MG capsule   . clonazePAM (KLONOPIN) 0.25 MG disintegrating tablet DISSOLVE 2 TABLETS IN MOUTH AT BEDTIME  . DHA-EPA-Vitamin E 703-500-9 MG-MG-UNIT CAPS Take 1 capsule by mouth 2 (two) times daily as needed (dry eyes).   Marland Kitchen donepezil (ARICEPT) 10 MG tablet Take 1 tablet (10 mg total) by mouth daily with breakfast.  . EPIPEN 2-PAK 0.3 MG/0.3ML SOAJ Inject 0.3 mg into the muscle once as needed. Allergic reaction  . fluticasone (FLONASE) 50 MCG/ACT nasal spray Place 2 sprays into the nose daily as needed for allergies.   Marland Kitchen guaiFENesin (MUCINEX) 600 MG 12 hr tablet Take 2 tablets (1,200 mg total) by mouth 2 (two)  times daily.  Marland Kitchen levothyroxine (SYNTHROID, LEVOTHROID) 125 MCG tablet Take 125 mcg by mouth daily.    Marland Kitchen lisinopril (PRINIVIL,ZESTRIL) 40 MG tablet TAKE 1 TABLET BY MOUTH ONCE DAILY  . memantine (NAMENDA) 10 MG tablet Take 1 tablet (10 mg total) by mouth 2 (two) times daily.  Marland Kitchen OVER THE COUNTER MEDICATION Take 1 tablet by mouth 2 (two) times daily. Macular degeneration supplement  . OVER THE COUNTER MEDICATION Take 1 capsule by mouth daily. Homocysteine supplement  . rosuvastatin (CRESTOR) 5 MG tablet Take 1 tablet (5 mg total) by mouth daily.  Marland Kitchen warfarin (COUMADIN) 5 MG tablet TAKE AS DIRECTED BY COUMADIN CLINIC   No current facility-administered medications for this visit.  (Other)      REVIEW  OF SYSTEMS: ROS    Positive for: Constitutional, Gastrointestinal, Neurological, Genitourinary, Musculoskeletal, HENT, Endocrine, Cardiovascular, Eyes   Negative for: Psychiatric   Last edited by Troy Jordan, LPN on 02/07/9457  5:92 PM. (History)       ALLERGIES Allergies  Allergen Reactions  . Bee Venom Anaphylaxis  . Other Anaphylaxis    Other reaction(s): Laryngeal Edema (ALLERGY) "a heart medication" ? unknown  . Tamsulosin Other (See Comments)    Insomnia  . Penicillins Rash  . Tetanus Toxoids Rash    PAST MEDICAL HISTORY Past Medical History:  Diagnosis Date  . AAA (abdominal aortic aneurysm) ([place])    with mural thrombus  . Chronic anticoagulation   . CKD (chronic kidney disease) stage 2, GFR 60-89 ml/min   . Constipation   . Coronary artery disease 07/2009   severe 3 vessel ASCAD s/p CABG with LIMA to LAD, SVG to diag, SVG to PL  . Dementia   . Diabetes mellitus   . Diverticulosis   . Edema extremities 09/03/2017  . Facial nerve spasm    L eye, tx'd with botox  . Fecal impaction ([place])   . Gout   . Hepatic steatosis   . Hyperlipidemia   . Hypertension   . Hypothyroidism   . OSA (obstructive sleep apnea)    intolerant to CPAP followed by Dr. Lamonte Sakai  . Parkinsonism ([place])   . Persistent atrial fibrillation [facility])    Past Surgical History:  Procedure Laterality Date  . ABDOMINAL AORTIC ANEURYSM REPAIR  01-2010   aorto-bi-iliac Gore Excluder stent graft  . CORONARY ARTERY BYPASS GRAFT  07-2009  . EYE SURGERY Left May  2013   Cataract  . EYE SURGERY Right June 2013   Cataract  . SPINE SURGERY  1998    FAMILY HISTORY Family History  Problem Relation Age of Onset  . Heart disease Mother        CHF  . Alzheimer's disease Mother   . Alzheimer's disease Father   . Alzheimer's disease Brother   . Alzheimer's disease Paternal Uncle     SOCIAL HISTORY Social History   Tobacco Use  . Smoking status: Former Smoker    Years: 15.00    Types: Cigarettes     Last attempt to quit: 10/07/2008    Years since quitting: 9.2  . Smokeless tobacco: Never Used  Substance Use Topics  . Alcohol use: Yes    Alcohol/week: 1.2 - 1.8 oz    Types: 2 - 3 Glasses of wine per week    Comment: A week  . Drug use: No         OPHTHALMIC EXAM:  Base Eye Exam    Visual Acuity (Snellen - Linear)  Right Left   Dist cc 20/40 -2 20/60 -1   Dist ph cc NI 20/40 -2       Tonometry (Tonopen, 1:49 PM)      Right Left   Pressure 13 12       Pupils      Dark Light Shape React APD   Right 3 2.5 Round Minimal Trace   Left 3 1.5 Round Minimal None       Visual Fields (Counting fingers)      Left Right    Full Full       Extraocular Movement      Right Left    Full, Ortho Full, Ortho       Neuro/Psych    Oriented x3:  Yes   Mood/Affect:  Normal       Dilation    Both eyes:  1.0% Mydriacyl, 2.5% Phenylephrine @ 1:49 PM        Slit Lamp and Fundus Exam    External Exam      Right Left   External Brow ptosis Brow ptosis       Slit Lamp Exam      Right Left   Lids/Lashes Dermatochalasis - upper lid, Scurf, Ptosis, Telangiectatic lesion on upper lid margin Ptosis, Scurf, Dermatochalasis - upper lid   Conjunctiva/Sclera White and quiet White and quiet   Cornea 3+ Punctate epithelial erosions, irregular K surface inferiorly 3-4+ central Punctate epithelial erosions   Anterior Chamber Deep and quiet Deep and quiet   Iris Round and dilated Round and dilated   Lens Posterior chamber intraocular lens - good postion, open PC Posterior chamber intraocular lens - good postion   Vitreous Asteroid hyalosis Asteroid hyalosis       Fundus Exam      Right Left   Disc Tilted disc, cupping, Pallor Tilted disc, cupping, Pallor   C/D Ratio 0.8 0.8   Macula Flat, Drusen, Retinal pigment epithelial mottling, no heme or edema Flat, Drusen, Retinal pigment epithelial mottling, no heme or edema   Vessels Vascular attenuation - mild Vascular attenuation  - mild   Periphery attached attached          IMAGING AND PROCEDURES  Imaging and Procedures for 01/16/18  OCT, Retina - OU - Both Eyes     Right Eye Quality was borderline. Central Foveal Thickness: 243. Progression has been stable. Findings include normal foveal contour, no SRF, outer retinal atrophy, epiretinal membrane, retinal drusen , intraretinal fluid (PVD; trace ERM; peripapillary VMT; ).   Left Eye Quality was poor. Central Foveal Thickness: 304. Progression has no prior data. Findings include no IRF, no SRF, outer retinal atrophy, epiretinal membrane, abnormal foveal contour, retinal drusen  (Myopic contour; trace ERM).   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 CME - cystoid macular edema. PED - pigment epithelial detachment. IRF - intraretinal fluid. SRF - subretinal fluid. EZ - ellipsoid zone. ERM - epiretinal membrane. ORA - outer retinal atrophy. ORT - outer retinal tubulation. SRHM - subretinal hyper-reflective material                  ASSESSMENT/PLAN:    ICD-10-CM   1. Intermediate stage nonexudative age-related macular degeneration of both eyes  H35.3132   2. Vitreomacular adhesion of right eye H43.821   3. Retinal edema H35.81 OCT, Retina - OU - Both Eyes  4. Diabetes mellitus type 2 without retinopathy ([place]) E11.9   5. Dry eye syndrome of both eyes H04.123   6. Lagophthalmos of left upper eyelid, unspecified lagophthalmos type H02.204   7. Pseudophakia of both eyes Z96.1   8. 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 Parkinson's disease ([place]) G20     1. Age related macular degeneration, non-exudative, both eyes  - The incidence, anatomy, and pathology of dry AMD, risk of progression, and the AREDS and AREDS 2  study including smoking risks discussed with patient.  - Recommend amsler grid monitoring  - f/u 4-6 mos  2,3. Vitreomacular adhesion with retinal edema OD  - peripapillary VMT causing mild edema OD -- stable from prior  - questionable visual significance -- will continue to monitor for now  4. DM2 w/o retinopathy  - Discussed importance of tight BG and BP control  - A1C - 6-6.5; States CBG is stable  - Monitor annually  5,6. DES OU w/ lagophthalmos OS-   - Severe dryness OU on prn use of ATs -- suspect this is a major contributor to gradual decrease in vision  - Recommend ATs OU q1-2h and lubricating gel/ointment at night  - s/p blepharoplasty OS x2 with Dr. [name] at [facility]  - recommend f/u with Dr. Toy [name] at new practice -- contact information given to pt  7. Pseudophakia OU  - s/p CE/IOL OU  - beautiful surgery, doing well  - monitor  8. Optic neuropathy OD  - small APD detected OD  - extensive history of microvascular disease  - monitor  9. Parkinson's disease  - complicates full evaluation of visual function  - management per primary Neuro doctors    Ophthalmic Meds Ordered this visit:  No orders of the defined types were placed in this encounter.      Return in about 5 months (around 06/14/2018) for F/U Non-Exu AMD OU, Dilated exam, OCT.  There are no Patient Instructions on file for this visit.   Explained the diagnoses, plan, and follow up with the patient and they expressed understanding.  Patient expressed understanding of the importance of proper follow up care.   This document serves as a record of services personally performed by Gardiner Sleeper, MD, PhD. It was created on their behalf by Catha Brow, [place], a certified ophthalmic assistant. The creation of this record is the provider's dictation and/or activities during the visit.  Electronically signed by: Catha Brow, [place]  01/16/18 10:29 AM   Gardiner Sleeper, M.D., Ph.D. Diseases &  Surgery of the Retina and Vitreous [facility] 01/16/18  I have reviewed the above documentation for accuracy and completeness, and I agree with the above. Gardiner Sleeper, M.D., Ph.D. 01/16/18 10:29 AM     Abbreviations: M myopia (nearsighted); A astigmatism; H hyperopia (farsighted); P presbyopia; Mrx spectacle prescription;  CTL contact lenses; OD right eye; OS left eye; OU both eyes  XT exotropia; ET esotropia; PEK punctate epithelial keratitis; PEE punctate epithelial erosions; DES dry eye syndrome; MGD meibomian gland dysfunction; ATs artificial tears; PFAT's preservative free artificial tears; [place] nuclear sclerotic cataract; PSC posterior subcapsular cataract; ERM epi-retinal membrane; PVD posterior vitreous detachment; RD retinal detachment; DM diabetes mellitus; DR diabetic retinopathy; NPDR non-proliferative diabetic retinopathy; PDR proliferative diabetic retinopathy; CSME clinically significant macular edema; DME diabetic macular edema; dbh  dot blot hemorrhages; CWS cotton wool spot; POAG primary open angle glaucoma; C/D cup-to-disc ratio; HVF humphrey visual field; GVF goldmann visual field; OCT optical coherence tomography; IOP intraocular pressure; BRVO Branch retinal vein occlusion; CRVO central retinal vein occlusion; CRAO central retinal artery occlusion; BRAO branch retinal artery occlusion; RT retinal tear; SB scleral buckle; PPV pars plana vitrectomy; VH Vitreous hemorrhage; PRP panretinal laser photocoagulation; IVK intravitreal kenalog; VMT vitreomacular traction; MH Macular hole;  NVD neovascularization of the disc; NVE neovascularization elsewhere; AREDS age related eye disease study; ARMD age related macular degeneration; POAG primary open angle glaucoma; EBMD epithelial/anterior basement membrane dystrophy; ACIOL anterior chamber intraocular lens; IOL intraocular lens; PCIOL posterior chamber intraocular lens; Phaco/IOL phacoemulsification with intraocular  lens placement; [place] photorefractive keratectomy; LASIK laser assisted in situ keratomileusis; HTN hypertension; DM diabetes mellitus; COPD chronic obstructive pulmonary disease

## 2018-01-15 ENCOUNTER — Encounter (INDEPENDENT_AMBULATORY_CARE_PROVIDER_SITE_OTHER): Payer: Self-pay | Admitting: Ophthalmology

## 2018-01-15 ENCOUNTER — Ambulatory Visit (INDEPENDENT_AMBULATORY_CARE_PROVIDER_SITE_OTHER): Payer: Medicare Other | Admitting: Ophthalmology

## 2018-01-15 DIAGNOSIS — H353132 Nonexudative age-related macular degeneration, bilateral, intermediate dry stage: Secondary | ICD-10-CM

## 2018-01-15 DIAGNOSIS — H469 Unspecified optic neuritis: Secondary | ICD-10-CM | POA: Diagnosis not present

## 2018-01-15 DIAGNOSIS — H02204 Unspecified lagophthalmos left upper eyelid: Secondary | ICD-10-CM | POA: Diagnosis not present

## 2018-01-15 DIAGNOSIS — H43821 Vitreomacular adhesion, right eye: Secondary | ICD-10-CM

## 2018-01-15 DIAGNOSIS — Z961 Presence of intraocular lens: Secondary | ICD-10-CM | POA: Diagnosis not present

## 2018-01-15 DIAGNOSIS — H04123 Dry eye syndrome of bilateral lacrimal glands: Secondary | ICD-10-CM | POA: Diagnosis not present

## 2018-01-15 DIAGNOSIS — H3581 Retinal edema: Secondary | ICD-10-CM

## 2018-01-15 DIAGNOSIS — G20 Parkinson's disease: Secondary | ICD-10-CM | POA: Diagnosis not present

## 2018-01-15 DIAGNOSIS — G20A1 Parkinson's disease without dyskinesia, without mention of fluctuations: Secondary | ICD-10-CM

## 2018-01-15 DIAGNOSIS — E119 Type 2 diabetes mellitus without complications: Secondary | ICD-10-CM | POA: Diagnosis not present

## 2018-01-16 ENCOUNTER — Encounter (INDEPENDENT_AMBULATORY_CARE_PROVIDER_SITE_OTHER): Payer: Self-pay | Admitting: Ophthalmology

## 2018-01-22 ENCOUNTER — Encounter: Payer: Self-pay | Admitting: Cardiology

## 2018-01-22 DIAGNOSIS — N419 Inflammatory disease of prostate, unspecified: Secondary | ICD-10-CM | POA: Diagnosis not present

## 2018-01-23 NOTE — Telephone Encounter (Signed)
Spoke with patient's spouse (dpr on file) She stated that patient was started on Cipro yesterday and was instructed by the pharmacy to follow up with MD. Per Jinny Blossom, patient needs to be seen in the coumadin clinic 3 days after starting cipro. Patient is scheduled at the coumadin clinic on 01/24/18. She is in agreement with plan and thankful for the call.

## 2018-01-24 ENCOUNTER — Ambulatory Visit (INDEPENDENT_AMBULATORY_CARE_PROVIDER_SITE_OTHER): Payer: Medicare Other | Admitting: Pharmacist

## 2018-01-24 DIAGNOSIS — I4819 Other persistent atrial fibrillation: Secondary | ICD-10-CM

## 2018-01-24 DIAGNOSIS — Z5181 Encounter for therapeutic drug level monitoring: Secondary | ICD-10-CM | POA: Diagnosis not present

## 2018-01-24 DIAGNOSIS — I481 Persistent atrial fibrillation: Secondary | ICD-10-CM

## 2018-01-24 DIAGNOSIS — I4891 Unspecified atrial fibrillation: Secondary | ICD-10-CM

## 2018-01-24 LAB — POCT INR: INR: 2.4

## 2018-01-24 NOTE — Patient Instructions (Signed)
Description   Continue on same dosage 1 tablet daily except 1.5 tablets on Tuesdays. Recheck INR 6 weeks. Continue eating dark green leafy vegetables consistently.  Call us with any meds changes # [phone].

## 2018-02-21 DIAGNOSIS — T148XXA Other injury of unspecified body region, initial encounter: Secondary | ICD-10-CM | POA: Diagnosis not present

## 2018-02-21 DIAGNOSIS — J309 Allergic rhinitis, unspecified: Secondary | ICD-10-CM | POA: Diagnosis not present

## 2018-02-21 DIAGNOSIS — L740 Miliaria rubra: Secondary | ICD-10-CM | POA: Diagnosis not present

## 2018-02-22 ENCOUNTER — Other Ambulatory Visit: Payer: Self-pay | Admitting: Cardiology

## 2018-02-24 DIAGNOSIS — G5133 Clonic hemifacial spasm, bilateral: Secondary | ICD-10-CM | POA: Diagnosis not present

## 2018-02-24 DIAGNOSIS — G5132 Clonic hemifacial spasm, left: Secondary | ICD-10-CM | POA: Diagnosis not present

## 2018-02-28 DIAGNOSIS — R351 Nocturia: Secondary | ICD-10-CM | POA: Diagnosis not present

## 2018-02-28 DIAGNOSIS — N401 Enlarged prostate with lower urinary tract symptoms: Secondary | ICD-10-CM | POA: Diagnosis not present

## 2018-03-07 ENCOUNTER — Ambulatory Visit (INDEPENDENT_AMBULATORY_CARE_PROVIDER_SITE_OTHER): Payer: Medicare Other | Admitting: *Deleted

## 2018-03-07 DIAGNOSIS — Z5181 Encounter for therapeutic drug level monitoring: Secondary | ICD-10-CM

## 2018-03-07 DIAGNOSIS — I4891 Unspecified atrial fibrillation: Secondary | ICD-10-CM

## 2018-03-07 DIAGNOSIS — I4819 Other persistent atrial fibrillation: Secondary | ICD-10-CM

## 2018-03-07 DIAGNOSIS — I481 Persistent atrial fibrillation: Secondary | ICD-10-CM

## 2018-03-07 LAB — POCT INR: INR: 3

## 2018-03-07 NOTE — Patient Instructions (Signed)
Description   Continue on same dosage 1 tablet daily except 1.5 tablets on Tuesdays. Recheck INR 6 weeks. Continue eating dark green leafy vegetables consistently.  Call us with any meds changes # [phone].

## 2018-03-14 ENCOUNTER — Encounter (HOSPITAL_COMMUNITY): Payer: Self-pay | Admitting: Emergency Medicine

## 2018-03-14 ENCOUNTER — Inpatient Hospital Stay (HOSPITAL_COMMUNITY)
Admission: EM | Admit: 2018-03-14 | Discharge: 2018-03-26 | DRG: 682 | Disposition: E | Payer: Medicare Other | Attending: Pulmonary Disease | Admitting: Pulmonary Disease

## 2018-03-14 ENCOUNTER — Emergency Department (HOSPITAL_COMMUNITY): Payer: Medicare Other

## 2018-03-14 ENCOUNTER — Inpatient Hospital Stay (HOSPITAL_COMMUNITY): Payer: Medicare Other

## 2018-03-14 ENCOUNTER — Other Ambulatory Visit: Payer: Self-pay

## 2018-03-14 DIAGNOSIS — G3184 Mild cognitive impairment, so stated: Secondary | ICD-10-CM | POA: Diagnosis not present

## 2018-03-14 DIAGNOSIS — R001 Bradycardia, unspecified: Secondary | ICD-10-CM | POA: Diagnosis not present

## 2018-03-14 DIAGNOSIS — G253 Myoclonus: Secondary | ICD-10-CM | POA: Diagnosis not present

## 2018-03-14 DIAGNOSIS — R0902 Hypoxemia: Secondary | ICD-10-CM | POA: Diagnosis not present

## 2018-03-14 DIAGNOSIS — R339 Retention of urine, unspecified: Secondary | ICD-10-CM | POA: Diagnosis present

## 2018-03-14 DIAGNOSIS — Z888 Allergy status to other drugs, medicaments and biological substances status: Secondary | ICD-10-CM

## 2018-03-14 DIAGNOSIS — Z79899 Other long term (current) drug therapy: Secondary | ICD-10-CM

## 2018-03-14 DIAGNOSIS — F0280 Dementia in other diseases classified elsewhere without behavioral disturbance: Secondary | ICD-10-CM | POA: Diagnosis present

## 2018-03-14 DIAGNOSIS — Z4682 Encounter for fitting and adjustment of non-vascular catheter: Secondary | ICD-10-CM | POA: Diagnosis not present

## 2018-03-14 DIAGNOSIS — I129 Hypertensive chronic kidney disease with stage 1 through stage 4 chronic kidney disease, or unspecified chronic kidney disease: Secondary | ICD-10-CM | POA: Diagnosis present

## 2018-03-14 DIAGNOSIS — W1839XA Other fall on same level, initial encounter: Secondary | ICD-10-CM | POA: Diagnosis present

## 2018-03-14 DIAGNOSIS — R791 Abnormal coagulation profile: Secondary | ICD-10-CM | POA: Diagnosis present

## 2018-03-14 DIAGNOSIS — I714 Abdominal aortic aneurysm, without rupture, unspecified: Secondary | ICD-10-CM | POA: Diagnosis present

## 2018-03-14 DIAGNOSIS — K760 Fatty (change of) liver, not elsewhere classified: Secondary | ICD-10-CM | POA: Diagnosis present

## 2018-03-14 DIAGNOSIS — J9602 Acute respiratory failure with hypercapnia: Secondary | ICD-10-CM | POA: Diagnosis not present

## 2018-03-14 DIAGNOSIS — Z880 Allergy status to penicillin: Secondary | ICD-10-CM

## 2018-03-14 DIAGNOSIS — Z951 Presence of aortocoronary bypass graft: Secondary | ICD-10-CM

## 2018-03-14 DIAGNOSIS — Z781 Physical restraint status: Secondary | ICD-10-CM

## 2018-03-14 DIAGNOSIS — N179 Acute kidney failure, unspecified: Principal | ICD-10-CM | POA: Diagnosis present

## 2018-03-14 DIAGNOSIS — E878 Other disorders of electrolyte and fluid balance, not elsewhere classified: Secondary | ICD-10-CM | POA: Diagnosis present

## 2018-03-14 DIAGNOSIS — G4733 Obstructive sleep apnea (adult) (pediatric): Secondary | ICD-10-CM | POA: Diagnosis present

## 2018-03-14 DIAGNOSIS — R4701 Aphasia: Secondary | ICD-10-CM | POA: Diagnosis not present

## 2018-03-14 DIAGNOSIS — E1122 Type 2 diabetes mellitus with diabetic chronic kidney disease: Secondary | ICD-10-CM | POA: Diagnosis present

## 2018-03-14 DIAGNOSIS — Z9119 Patient's noncompliance with other medical treatment and regimen: Secondary | ICD-10-CM

## 2018-03-14 DIAGNOSIS — N320 Bladder-neck obstruction: Secondary | ICD-10-CM

## 2018-03-14 DIAGNOSIS — G5139 Clonic hemifacial spasm, unspecified: Secondary | ICD-10-CM | POA: Diagnosis not present

## 2018-03-14 DIAGNOSIS — E785 Hyperlipidemia, unspecified: Secondary | ICD-10-CM | POA: Diagnosis present

## 2018-03-14 DIAGNOSIS — D62 Acute posthemorrhagic anemia: Secondary | ICD-10-CM | POA: Diagnosis not present

## 2018-03-14 DIAGNOSIS — Z515 Encounter for palliative care: Secondary | ICD-10-CM | POA: Diagnosis not present

## 2018-03-14 DIAGNOSIS — R571 Hypovolemic shock: Secondary | ICD-10-CM | POA: Diagnosis not present

## 2018-03-14 DIAGNOSIS — R579 Shock, unspecified: Secondary | ICD-10-CM | POA: Diagnosis not present

## 2018-03-14 DIAGNOSIS — M109 Gout, unspecified: Secondary | ICD-10-CM | POA: Diagnosis present

## 2018-03-14 DIAGNOSIS — E7800 Pure hypercholesterolemia, unspecified: Secondary | ICD-10-CM | POA: Diagnosis not present

## 2018-03-14 DIAGNOSIS — G931 Anoxic brain damage, not elsewhere classified: Secondary | ICD-10-CM | POA: Diagnosis not present

## 2018-03-14 DIAGNOSIS — N182 Chronic kidney disease, stage 2 (mild): Secondary | ICD-10-CM | POA: Diagnosis present

## 2018-03-14 DIAGNOSIS — Z66 Do not resuscitate: Secondary | ICD-10-CM | POA: Diagnosis present

## 2018-03-14 DIAGNOSIS — J9601 Acute respiratory failure with hypoxia: Secondary | ICD-10-CM | POA: Diagnosis not present

## 2018-03-14 DIAGNOSIS — I469 Cardiac arrest, cause unspecified: Secondary | ICD-10-CM

## 2018-03-14 DIAGNOSIS — Z7901 Long term (current) use of anticoagulants: Secondary | ICD-10-CM

## 2018-03-14 DIAGNOSIS — Z79890 Hormone replacement therapy (postmenopausal): Secondary | ICD-10-CM

## 2018-03-14 DIAGNOSIS — Z452 Encounter for adjustment and management of vascular access device: Secondary | ICD-10-CM

## 2018-03-14 DIAGNOSIS — N1330 Unspecified hydronephrosis: Secondary | ICD-10-CM | POA: Diagnosis present

## 2018-03-14 DIAGNOSIS — Z91030 Bee allergy status: Secondary | ICD-10-CM

## 2018-03-14 DIAGNOSIS — J9690 Respiratory failure, unspecified, unspecified whether with hypoxia or hypercapnia: Secondary | ICD-10-CM

## 2018-03-14 DIAGNOSIS — E874 Mixed disorder of acid-base balance: Secondary | ICD-10-CM | POA: Diagnosis present

## 2018-03-14 DIAGNOSIS — I2510 Atherosclerotic heart disease of native coronary artery without angina pectoris: Secondary | ICD-10-CM | POA: Diagnosis not present

## 2018-03-14 DIAGNOSIS — I4819 Other persistent atrial fibrillation: Secondary | ICD-10-CM | POA: Diagnosis present

## 2018-03-14 DIAGNOSIS — G20 Parkinson's disease: Secondary | ICD-10-CM

## 2018-03-14 DIAGNOSIS — I481 Persistent atrial fibrillation: Secondary | ICD-10-CM | POA: Diagnosis not present

## 2018-03-14 DIAGNOSIS — I6782 Cerebral ischemia: Secondary | ICD-10-CM | POA: Diagnosis not present

## 2018-03-14 DIAGNOSIS — Z9841 Cataract extraction status, right eye: Secondary | ICD-10-CM

## 2018-03-14 DIAGNOSIS — N3289 Other specified disorders of bladder: Secondary | ICD-10-CM | POA: Diagnosis not present

## 2018-03-14 DIAGNOSIS — R338 Other retention of urine: Secondary | ICD-10-CM | POA: Diagnosis not present

## 2018-03-14 DIAGNOSIS — R0602 Shortness of breath: Secondary | ICD-10-CM

## 2018-03-14 DIAGNOSIS — R310 Gross hematuria: Secondary | ICD-10-CM | POA: Diagnosis present

## 2018-03-14 DIAGNOSIS — I10 Essential (primary) hypertension: Secondary | ICD-10-CM | POA: Diagnosis present

## 2018-03-14 DIAGNOSIS — S0990XA Unspecified injury of head, initial encounter: Secondary | ICD-10-CM | POA: Diagnosis not present

## 2018-03-14 DIAGNOSIS — E8881 Metabolic syndrome: Secondary | ICD-10-CM | POA: Diagnosis present

## 2018-03-14 DIAGNOSIS — N1339 Other hydronephrosis: Secondary | ICD-10-CM | POA: Diagnosis not present

## 2018-03-14 DIAGNOSIS — J9811 Atelectasis: Secondary | ICD-10-CM | POA: Diagnosis not present

## 2018-03-14 DIAGNOSIS — J690 Pneumonitis due to inhalation of food and vomit: Secondary | ICD-10-CM | POA: Diagnosis not present

## 2018-03-14 DIAGNOSIS — Z9842 Cataract extraction status, left eye: Secondary | ICD-10-CM

## 2018-03-14 DIAGNOSIS — R918 Other nonspecific abnormal finding of lung field: Secondary | ICD-10-CM | POA: Diagnosis not present

## 2018-03-14 DIAGNOSIS — D494 Neoplasm of unspecified behavior of bladder: Secondary | ICD-10-CM | POA: Diagnosis present

## 2018-03-14 DIAGNOSIS — N131 Hydronephrosis with ureteral stricture, not elsewhere classified: Secondary | ICD-10-CM | POA: Diagnosis not present

## 2018-03-14 DIAGNOSIS — Z87891 Personal history of nicotine dependence: Secondary | ICD-10-CM

## 2018-03-14 DIAGNOSIS — E039 Hypothyroidism, unspecified: Secondary | ICD-10-CM | POA: Diagnosis present

## 2018-03-14 DIAGNOSIS — N329 Bladder disorder, unspecified: Secondary | ICD-10-CM | POA: Diagnosis present

## 2018-03-14 DIAGNOSIS — G3183 Dementia with Lewy bodies: Secondary | ICD-10-CM | POA: Diagnosis present

## 2018-03-14 DIAGNOSIS — D500 Iron deficiency anemia secondary to blood loss (chronic): Secondary | ICD-10-CM | POA: Diagnosis not present

## 2018-03-14 DIAGNOSIS — R51 Headache: Secondary | ICD-10-CM | POA: Diagnosis not present

## 2018-03-14 DIAGNOSIS — N401 Enlarged prostate with lower urinary tract symptoms: Secondary | ICD-10-CM | POA: Diagnosis present

## 2018-03-14 DIAGNOSIS — F0390 Unspecified dementia without behavioral disturbance: Secondary | ICD-10-CM | POA: Diagnosis present

## 2018-03-14 DIAGNOSIS — I468 Cardiac arrest due to other underlying condition: Secondary | ICD-10-CM | POA: Diagnosis not present

## 2018-03-14 LAB — HEPATIC FUNCTION PANEL
ALT: 6 U/L — ABNORMAL LOW (ref 17–63)
AST: 22 U/L (ref 15–41)
Albumin: 4 g/dL (ref 3.5–5.0)
Alkaline Phosphatase: 56 U/L (ref 38–126)
BILIRUBIN DIRECT: 0.1 mg/dL (ref 0.1–0.5)
BILIRUBIN INDIRECT: 0.6 mg/dL (ref 0.3–0.9)
BILIRUBIN TOTAL: 0.7 mg/dL (ref 0.3–1.2)
Total Protein: 7.2 g/dL (ref 6.5–8.1)

## 2018-03-14 LAB — BASIC METABOLIC PANEL
Anion gap: 10 (ref 5–15)
BUN: 52 mg/dL — AB (ref 6–20)
CALCIUM: 9.4 mg/dL (ref 8.9–10.3)
CO2: 25 mmol/L (ref 22–32)
CREATININE: 3.01 mg/dL — AB (ref 0.61–1.24)
Chloride: 111 mmol/L (ref 101–111)
GFR calc non Af Amer: 18 mL/min — ABNORMAL LOW (ref >60)
GFR, EST AFRICAN AMERICAN: 21 mL/min — AB (ref >60)
Glucose, Bld: 106 mg/dL — ABNORMAL HIGH (ref 65–99)
Potassium: 4 mmol/L (ref 3.5–5.1)
SODIUM: 146 mmol/L — AB (ref 135–145)

## 2018-03-14 LAB — CBC
HEMATOCRIT: 37.6 % — AB (ref 39.0–52.0)
HEMOGLOBIN: 12.7 g/dL — AB (ref 13.0–17.0)
MCH: 30.8 pg (ref 26.0–34.0)
MCHC: 33.8 g/dL (ref 30.0–36.0)
MCV: 91 fL (ref 78.0–100.0)
Platelets: 135 10*3/uL — ABNORMAL LOW (ref 150–400)
RBC: 4.13 MIL/uL — ABNORMAL LOW (ref 4.22–5.81)
RDW: 15.3 % (ref 11.5–15.5)
WBC: 7.5 10*3/uL (ref 4.0–10.5)

## 2018-03-14 LAB — URINALYSIS, ROUTINE W REFLEX MICROSCOPIC
Bilirubin Urine: NEGATIVE
GLUCOSE, UA: NEGATIVE mg/dL
KETONES UR: NEGATIVE mg/dL
Leukocytes, UA: NEGATIVE
Nitrite: NEGATIVE
PH: 5 (ref 5.0–8.0)
Protein, ur: 30 mg/dL — AB
SPECIFIC GRAVITY, URINE: 1.009 (ref 1.005–1.030)
Squamous Epithelial / LPF: NONE SEEN

## 2018-03-14 LAB — PROTIME-INR
INR: 2.89
PROTHROMBIN TIME: 30 s — AB (ref 11.4–15.2)

## 2018-03-14 MED ORDER — VITAMIN D 1000 UNITS PO TABS
2000.0000 [IU] | ORAL_TABLET | Freq: Every day | ORAL | Status: DC
  Administered 2018-03-15 – 2018-03-16 (×2): 2000 [IU] via ORAL
  Filled 2018-03-14 (×3): qty 2

## 2018-03-14 MED ORDER — SODIUM CHLORIDE 0.9 % IV BOLUS
500.0000 mL | Freq: Once | INTRAVENOUS | Status: AC
  Administered 2018-03-14: 500 mL via INTRAVENOUS

## 2018-03-14 MED ORDER — ACETAMINOPHEN 650 MG RE SUPP: 650.0000 mg | Freq: Four times a day (QID) | RECTAL | Status: DC | PRN

## 2018-03-14 MED ORDER — OMEGA-3-ACID ETHYL ESTERS 1 G PO CAPS
1.0000 g | ORAL_CAPSULE | Freq: Every day | ORAL | Status: DC
  Administered 2018-03-15 – 2018-03-16 (×2): 1 g via ORAL
  Filled 2018-03-14 (×2): qty 1

## 2018-03-14 MED ORDER — ACETAMINOPHEN 325 MG PO TABS: 650.0000 mg | ORAL_TABLET | Freq: Four times a day (QID) | ORAL | Status: DC | PRN

## 2018-03-14 MED ORDER — ROSUVASTATIN CALCIUM 10 MG PO TABS
5.0000 mg | ORAL_TABLET | Freq: Every day | ORAL | Status: DC
  Administered 2018-03-14 – 2018-03-19 (×6): 5 mg via ORAL
  Filled 2018-03-14 (×6): qty 1

## 2018-03-14 MED ORDER — FLUTICASONE PROPIONATE 50 MCG/ACT NA SUSP: 2.0000 | Freq: Every day | NASAL | Status: DC | PRN

## 2018-03-14 MED ORDER — SODIUM CHLORIDE 0.9 % IV SOLN
INTRAVENOUS | Status: DC
  Administered 2018-03-14 – 2018-03-16 (×4): via INTRAVENOUS

## 2018-03-14 MED ORDER — DONEPEZIL HCL 10 MG PO TABS
10.0000 mg | ORAL_TABLET | Freq: Every day | ORAL | Status: DC
  Administered 2018-03-15 – 2018-03-16 (×2): 10 mg via ORAL
  Filled 2018-03-14 (×3): qty 2

## 2018-03-14 MED ORDER — MEMANTINE HCL 5 MG PO TABS
10.0000 mg | ORAL_TABLET | Freq: Two times a day (BID) | ORAL | Status: DC
  Administered 2018-03-14 – 2018-03-16 (×5): 10 mg via ORAL
  Filled 2018-03-14 (×5): qty 1

## 2018-03-14 MED ORDER — LEVOTHYROXINE SODIUM 25 MCG PO TABS
125.0000 ug | ORAL_TABLET | Freq: Every day | ORAL | Status: DC
  Administered 2018-03-15 – 2018-03-19 (×4): 125 ug via ORAL
  Filled 2018-03-14 (×5): qty 1

## 2018-03-14 MED ORDER — AMLODIPINE BESYLATE 5 MG PO TABS
10.0000 mg | ORAL_TABLET | Freq: Every day | ORAL | Status: DC
  Administered 2018-03-15 – 2018-03-16 (×2): 10 mg via ORAL
  Filled 2018-03-14 (×2): qty 2

## 2018-03-14 MED ORDER — POLYVINYL ALCOHOL 1.4 % OP SOLN
1.0000 [drp] | Freq: Three times a day (TID) | OPHTHALMIC | Status: DC | PRN
  Filled 2018-03-14: qty 15

## 2018-03-14 MED ORDER — POLYETHYLENE GLYCOL 3350 17 G PO PACK: 17.0000 g | PACK | Freq: Every day | ORAL | Status: DC | PRN

## 2018-03-14 MED ORDER — CARBOXYMETHYLCELLULOSE SODIUM 0.5 % OP SOLN: 1.0000 [drp] | Freq: Three times a day (TID) | OPHTHALMIC | Status: DC | PRN

## 2018-03-14 MED ORDER — CLONAZEPAM 0.125 MG PO TBDP
0.2500 mg | ORAL_TABLET | Freq: Every day | ORAL | Status: DC
  Administered 2018-03-14 – 2018-03-16 (×3): 0.25 mg via ORAL
  Filled 2018-03-14 (×3): qty 2

## 2018-03-14 MED ORDER — ONDANSETRON HCL 4 MG PO TABS: 4.0000 mg | ORAL_TABLET | Freq: Four times a day (QID) | ORAL | Status: DC | PRN

## 2018-03-14 MED ORDER — SENNA 8.6 MG PO TABS
1.0000 | ORAL_TABLET | Freq: Two times a day (BID) | ORAL | Status: DC
  Administered 2018-03-14 – 2018-03-16 (×5): 8.6 mg via ORAL
  Filled 2018-03-14 (×5): qty 1

## 2018-03-14 MED ORDER — HYDROCODONE-ACETAMINOPHEN 5-325 MG PO TABS
1.0000 | ORAL_TABLET | ORAL | Status: DC | PRN
  Administered 2018-03-15 – 2018-03-16 (×3): 1 via ORAL
  Filled 2018-03-14: qty 2
  Filled 2018-03-14 (×2): qty 1

## 2018-03-14 MED ORDER — ONDANSETRON HCL 4 MG/2ML IJ SOLN: 4.0000 mg | Freq: Four times a day (QID) | INTRAMUSCULAR | Status: DC | PRN

## 2018-03-14 MED ORDER — DHA-EPA-VITAMIN E 200-300-5 MG-MG-UNIT PO CAPS: 2.0000 | ORAL_CAPSULE | Freq: Every day | ORAL | Status: DC

## 2018-03-14 MED ORDER — CARBIDOPA-LEVODOPA ER 50-200 MG PO TBCR
1.0000 | EXTENDED_RELEASE_TABLET | ORAL | Status: DC
Start: 2018-03-14 — End: 2018-03-17
  Administered 2018-03-14 – 2018-03-16 (×9): 1 via ORAL
  Filled 2018-03-14 (×10): qty 1

## 2018-03-14 NOTE — ED Provider Notes (Signed)
[place] DEPT Provider Note   CSN: [id number] Arrival date & time: 03/13/2018  1106     History   Chief Complaint Chief Complaint  Patient presents with  . Hematuria  . Dysuria    HPI Troy [name] is a 77 y.o. male.  HPI   Patient is a 77 year old male with a history of AAA s/p repair, CKD stage II, diabetes, hyperlipidemia, hypertension, persistent A. Fib (on Coumadin), parkinsonism, chronic LE edema (R>L), who presents the ED today to be evaluated for dysuria and hematuria.  Patient has a history of a aphasia chronically and his wife gave majority of history.  She states that patient has had dysuria since early April.  He was seen by Dr. Diona Fanti for dysuria on 02/28/18 where he had UA and bladder ultrasound.  He was started on Myrbetriq and was supposed be taking this for last 2 weeks.  Just finished this yesterday.  This morning he began to have gross hematuria around 8 AM.  Also was passing clots.  He was also complaining of lower abdominal pain however he states that this has improved since onset after he passed gas.  Denies any fevers, vomiting or diarrhea.  Does have history of chronic constipation, last BM was yesterday.  No blood in his stool.  Patient's wife notes that he had a mechanical fall last week in a parking lot.  Patient is able to express that he fell onto his knees first but then hit his forehead and nose on the ground.  Denies any pain to his nose now.  Further denies any neck pain, back pain, chest pain, shortness of breath, or headaches.  His wife does state that he has been more confused and disoriented over the last week.  He has denied any neck or back pain since then.  Past Medical History:  Diagnosis Date  . AAA (abdominal aortic aneurysm) ([place])    with mural thrombus  . Chronic anticoagulation   . CKD (chronic kidney disease) stage 2, GFR 60-89 ml/min   . Constipation   . Coronary artery disease 07/2009   severe 3 vessel  ASCAD s/p CABG with LIMA to LAD, SVG to diag, SVG to PL  . Dementia   . Diabetes mellitus   . Diverticulosis   . Edema extremities 09/03/2017  . Facial nerve spasm    L eye, tx'd with botox  . Fecal impaction ([place])   . Gout   . Hepatic steatosis   . Hyperlipidemia   . Hypertension   . Hypothyroidism   . OSA (obstructive sleep apnea)    intolerant to CPAP followed by Dr. Lamonte Sakai  . Parkinsonism ([place])   . Persistent atrial fibrillation [facility])     Patient Active Problem List   Diagnosis Date Noted  . Hematuria, gross 02/26/2018  . Edema extremities 09/03/2017  . Altered mental status 01/19/2017  . 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 Parkinsonism ([place]) 07/09/2014  . Mild cognitive impairment 07/09/2014  . 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 Encounter for therapeutic drug monitoring 12/17/2013  . 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 Hypertension   . Hyperlipidemia   . CKD (chronic kidney disease) stage 2, GFR 60-89 ml/min   . OSA (obstructive sleep apnea)   . Hepatic steatosis   . Chronic anticoagulation   . Abnormal gait 09/28/2013  . 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 Persistent atrial fibrillation ([place]) 08/28/2013  . AAA (  abdominal aortic aneurysm) without rupture ([place]) 04/14/2013  . Aftercare following surgery of the circulatory system, [place] 04/14/2013  . Coronary artery disease 07/27/2009    Past Surgical History:  Procedure Laterality Date  . ABDOMINAL AORTIC ANEURYSM REPAIR  01-2010   aorto-bi-iliac Gore Excluder stent graft  . CORONARY ARTERY BYPASS GRAFT  07-2009  . EYE SURGERY Left May  2013   Cataract  . EYE SURGERY Right June 2013   Cataract  . [place] Medications    Prior to Admission medications   Medication Sig Start Date End Date Taking? Authorizing Provider  amLODipine (NORVASC) 10 MG tablet TAKE ONE TABLET BY MOUTH ONCE DAILY 12/30/17  Yes Turner, Traci  R, MD  carbidopa-levodopa (SINEMET CR) 50-200 MG tablet Take 1 tablet by mouth 4 (four) times daily. 7, 11, 3 pm and 7 pm 11/27/17  Yes Star Age, MD  carboxymethylcellulose (REFRESH PLUS) 0.5 % SOLN Place 1 drop into both eyes 3 (three) times daily as needed (dry eyes).   Yes [provider]  Cholecalciferol (VITAMIN D-3) 1000 UNITS CAPS Take 2,000 Units by mouth daily.    Yes [provider]  Cinnamon 500 MG capsule Take 1,000 mg by mouth daily.    Yes [provider]  clonazePAM (KLONOPIN) 0.25 MG disintegrating tablet DISSOLVE 2 TABLETS IN MOUTH AT BEDTIME 01/13/18  Yes Star Age, MD  DHA-EPA-Vitamin E 076-226-3 MG-MG-UNIT CAPS Take 2 capsules by mouth daily.    Yes [provider]  donepezil (ARICEPT) 10 MG tablet Take 1 tablet (10 mg total) by mouth daily with breakfast. 01/13/18  Yes Star Age, MD  fluticasone (FLONASE) 50 MCG/ACT nasal spray Place 2 sprays into the nose daily as needed for allergies.    Yes [provider]  levothyroxine (SYNTHROID, LEVOTHROID) 125 MCG tablet Take 125 mcg by mouth daily.     Yes [provider]  lisinopril (PRINIVIL,ZESTRIL) 40 MG tablet TAKE 1 TABLET BY MOUTH ONCE DAILY 12/02/17  Yes Turner, Eber Hong, MD  memantine (NAMENDA) 10 MG tablet Take 1 tablet (10 mg total) by mouth 2 (two) times daily. 01/13/18  Yes Star Age, MD  OVER THE COUNTER MEDICATION Take 1 tablet by mouth 2 (two) times daily. Macular degeneration supplement   Yes [provider]  OVER THE COUNTER MEDICATION Take 1 capsule by mouth daily. Homocysteine supplement   Yes [provider]  rosuvastatin (CRESTOR) 5 MG tablet TAKE 1 TABLET BY MOUTH ONCE DAILY 02/25/18  Yes Turner, Traci R, MD  warfarin (COUMADIN) 5 MG tablet TAKE AS DIRECTED BY COUMADIN CLINIC Patient taking differently: Take 5-7.5 mg by mouth See admin instructions. Take 1 tablet (5 mg) by mouth daily except on Tuesdays Take 1.5 tablets (7.5 mg). 11/18/17   Yes Turner, Eber Hong, MD  EPIPEN 2-PAK 0.3 MG/0.3ML SOAJ Inject 0.3 mg into the muscle once as needed. Allergic reaction 02/25/13   [provider]  guaiFENesin (MUCINEX) 600 MG 12 hr tablet Take 2 tablets (1,200 mg total) by mouth 2 (two) times daily. Patient not taking: Reported on 03/15/2018 01/21/17   Oswald Hillock, MD  tamsulosin (FLOMAX) 0.4 MG CAPS capsule Take 0.4 mg by mouth daily.  02/28/18   [provider]    Family History Family History  Problem Relation Age of Onset  . Heart disease Mother        CHF  . Alzheimer's disease Mother   . Alzheimer's disease Father   .  Alzheimer's disease Brother   . Alzheimer's disease Paternal Uncle     Social History Social History   Tobacco Use  . Smoking status: Former Smoker    Years: 15.00    Types: Cigarettes    Last attempt to quit: 10/07/2008    Years since quitting: 9.4  . Smokeless tobacco: Never Used  Substance Use Topics  . Alcohol use: Yes    Alcohol/week: 1.2 - 1.8 oz    Types: 2 - 3 Glasses of wine per week    Comment: A week  . Drug use: No     Allergies   Bee venom; Other; Tamsulosin; Penicillins; and Tetanus toxoids   Review of Systems Review of Systems  Reason unable to perform ROS: limited due to aphasia.  Constitutional: Negative for chills, diaphoresis and fever.  HENT: Negative for sore throat.   Respiratory: Negative for cough and shortness of breath.   Cardiovascular: Negative for chest pain.  Gastrointestinal: Positive for abdominal pain. Negative for blood in stool, constipation, diarrhea, nausea and vomiting.  Genitourinary: Positive for dysuria, frequency and hematuria. Negative for flank pain.  Musculoskeletal: Negative for back pain and neck pain.  Skin: Negative for wound.  Neurological:       Head injury,  no LOC, more confused (according to wife), chronic aphasia     Physical Exam Updated Vital Signs BP (!) 148/85   Pulse (!) 58   Temp 98.1 F (36.7 C) (Oral)    Resp 16   Ht 5\' 7"  (1.702 m)   Wt 90.3 kg (199 lb)   SpO2 98%   BMI 31.17 kg/m   Physical Exam  Constitutional: He appears well-developed and well-nourished.  Nontoxic-appearing, no acute distress.  HENT:  Head: Normocephalic and atraumatic.  Right Ear: External ear normal.  Left Ear: External ear normal.  Nose: Nose normal.  Mouth/Throat: Oropharynx is clear and moist.  No battle signs, no raccoons eyes, no rhinorrhea.  No tenderness across the nose or obvious abrasions to the head.   Eyes: Pupils are equal, round, and reactive to light. Conjunctivae and EOM are normal.  Neck: Normal range of motion. Neck supple.  Cervical midline tenderness.  No paraspinous tenderness.  Cardiovascular: Normal rate, regular rhythm, normal heart sounds and intact distal pulses.  No murmur heard. Pulmonary/Chest: Effort normal and breath sounds normal. No respiratory distress. He has no wheezes. He has no rales.  Abdominal: Soft. Bowel sounds are normal. He exhibits no distension. There is no tenderness. There is no guarding.  Musculoskeletal:  No thoracic or lumbar midline tenderness.  No associated paraspinous tenderness to the spine. 3+ pitting edema to RLE, 2+ pitting edema to LLE, no calf ttp (edema chronic per wife and baseline)  Neurological: He is alert.  Mental Status:  Alert, chronic aphasia, able to answer simple questions, oriented to self and place, not oriented to date (chronic per wife) Cranial Nerves:  II:   pupils equal, round, reactive to light III,IV, VI: extra-ocular motions intact bilaterally  V,VII: Mild asymmetric with left-sided facial droop which is chronic per patient and wife VIII: hearing grossly normal to voice  X: uvula elevates symmetrically  XI: bilateral shoulder shrug symmetric and strong XII: midline tongue extension without fassiculations Motor:  Normal tone. 5/5 strength of BUE and BLE major muscle groups including strong and equal grip strength and  dorsiflexion/plantar flexion Sensory: light touch normal in all extremities. CV: 2+ radial and DP/PT pulses  Skin: Skin is warm and dry. Capillary refill takes  less than 2 seconds.  Psychiatric: He has a normal mood and affect.  Nursing note and vitals reviewed.    ED Treatments / Results  Labs (all labs ordered are listed, but only abnormal results are displayed) Labs Reviewed  URINALYSIS, ROUTINE W REFLEX MICROSCOPIC - Abnormal; Notable for the following components:      Result Value   APPearance HAZY (*)    Hgb urine dipstick LARGE (*)    Protein, ur 30 (*)    Bacteria, UA RARE (*)    All other components within normal limits  CBC - Abnormal; Notable for the following components:   RBC 4.13 (*)    Hemoglobin 12.7 (*)    HCT 37.6 (*)    Platelets 135 (*)    All other components within normal limits  BASIC METABOLIC PANEL - Abnormal; Notable for the following components:   Sodium 146 (*)    Glucose, Bld 106 (*)    BUN 52 (*)    Creatinine, Ser 3.01 (*)    GFR calc non Af Amer 18 (*)    GFR calc Af Amer 21 (*)    All other components within normal limits  PROTIME-INR - Abnormal; Notable for the following components:   Prothrombin Time 30.0 (*)    All other components within normal limits  HEPATIC FUNCTION PANEL - Abnormal; Notable for the following components:   ALT 6 (*)    All other components within normal limits    EKG None  Radiology No results found.  Procedures Procedures (including critical care time)  Medications Ordered in ED Medications  sodium chloride 0.9 % bolus 500 mL (0 mLs Intravenous Stopped 02/28/2018 1709)     Initial Impression / Assessment and Plan / ED Course  I have reviewed the triage vital signs and the nursing notes.  Pertinent labs & imaging results that were available during my care of the patient were reviewed by me and considered in my medical decision making (see chart for details).   5:18 PM CONSULT with hospitalist service,  Dr. Horris Latino, will admit the pt to the hospitalist service. She recommends urology consult.   5:29 PM CONSULT, with Dr. Alyson Ingles with urology who will consult on the patient.    Final Clinical Impressions(s) / ED Diagnoses   Final diagnoses:  AKI (acute kidney injury) [facility])  Gross hematuria    77 year old male presenting with gross hematuria and dysuria today.  His vital signs are stable and he is afebrile at this time.  Exam is grossly benign, he did have some mild lower abdominal tenderness without guarding or rigidity.  Did not have any blood from the meatus on GU exam however did have a small clot to the anterior portion of the penis.  UA significant for hematuria.  No evidence of UTI at this time.  Lab work shows drop in hemoglobin to 12.7, however last hgb on pt was from 1 year ago so unclear what baseline is for pt.  He also has elevated creatinine to 3 as well as elevated BUN to 52. PT INR at 2.89.   pt was recently started on Myrbetriq and there was concern for urinary retention however pt does not appear to be retaining at this time.  bladder ultrasound showed only 270 in the bladder.  Renal ultrasound is pending at this time.  Patient also had recent fall where he hit his forehead on cement, head CT has been ordered to rule out intercranial bleed given patient is anticoagulated and  wife is reporting that he seems somewhat confused after the fall this week.  No midline tenderness cervical thoracic or lumbar spine.  No evidence of other injuries at this time.  Patient will need to be admitted given his gross hematuria, elevated creatinine, and drop in hemoglobin.  Patient admitted to hospitalist service with urology consult as well.  ED Discharge Orders    None       Bishop Dublin 03/03/2018 1740    Dorie Rank, MD 03/15/18 [phone]

## 2018-03-14 NOTE — ED Notes (Signed)
Bladder scan 270 ml

## 2018-03-14 NOTE — H&P (Addendum)
History and Physical  Troy [name] UYQ:[id number] DOB: 1941/06/29 DOA: 03/08/2018  Referring physician: EDP PCP: Jonathon Jordan, MD  Outpatient Specialists: Cardiologist, Neurologist Patient coming from: Home & is able to ambulate with assistance  Chief Complaint: Gross hematuria, dysuria   HPI: Troy [name] is a 77 y.o. male with medical history significant for Parkinson's disease, persistent A. fib on Coumadin, CAD status post CABG, AAA status post repair, CKD, hypothyroidism, hyperlipidemia, hypertension, DM, hemifacial spasm, dementia presents to the ED complaining of gross hematuria and dysuria.  Patient's wife gave majority of the history due to patient with chronic aphasia.  Wife reported noticing gross hematuria with clots, first episode this a.m PTA, with associated dysuria, urinary frequency, nocturia which has been ongoing since February of this year.  Patient was recently seen by a urologist on 02/28/18 where a bladder scan was done in the office and patient was started on Myrbetriq for 2 weeks of which he completed recently.  Patient denies any abdominal pain, fever/chills, nausea/vomiting, chest pain, shortness of breath, diarrhea.  Patient's wife also reported a mechanical fall at a parking lot about a week ago with patient falling face forward, wife noted more confusion and disorientation over the last week.  Urology consulted.  Patient admitted for further management   ED Course: Patient noted to be an AK I with creatinine of 3 and BUN 52, above baseline, hemoglobin noted to be 12.7, baseline around 14.  Renal ultrasound showed moderate bilateral hydronephrosis likely due to a mass lesion in the urinary bladder. This could be a primary bladder tumor or could be related to the prostate gland. EDP consulted urology  Review of Systems: Review of systems are otherwise negative   Past Medical History:  Diagnosis Date  . AAA (abdominal aortic aneurysm) ([place])    with mural  thrombus  . Chronic anticoagulation   . CKD (chronic kidney disease) stage 2, GFR 60-89 ml/min   . Constipation   . Coronary artery disease 07/2009   severe 3 vessel ASCAD s/p CABG with LIMA to LAD, SVG to diag, SVG to PL  . Dementia   . Diabetes mellitus   . Diverticulosis   . Edema extremities 09/03/2017  . Facial nerve spasm    L eye, tx'd with botox  . Fecal impaction ([place])   . Gout   . Hepatic steatosis   . Hyperlipidemia   . Hypertension   . Hypothyroidism   . OSA (obstructive sleep apnea)    intolerant to CPAP followed by Dr. Lamonte Sakai  . Parkinsonism ([place])   . Persistent atrial fibrillation [facility])    Past Surgical History:  Procedure Laterality Date  . ABDOMINAL AORTIC ANEURYSM REPAIR  01-2010   aorto-bi-iliac Gore Excluder stent graft  . CORONARY ARTERY BYPASS GRAFT  07-2009  . EYE SURGERY Left May  2013   Cataract  . EYE SURGERY Right June 2013   Cataract  . [place]    Social History:  reports that he quit smoking about 9 years ago. His smoking use included cigarettes. He quit after 15.00 years of use. He has never used smokeless tobacco. He reports that he drinks about 1.2 - 1.8 oz of alcohol per week. He reports that he does not use drugs.   Allergies  Allergen Reactions  . Bee Venom Anaphylaxis  . Other Anaphylaxis    Other reaction(s): Laryngeal Edema (ALLERGY) "a heart medication" ? unknown  . Tamsulosin Other (See Comments)    Insomnia  .  Penicillins Rash    Has patient had a PCN reaction causing immediate rash, facial/tongue/throat swelling, SOB or lightheadedness with hypotension: Y Has patient had a PCN reaction causing severe rash involving mucus membranes or skin necrosis: Y Has patient had a PCN reaction that required hospitalization: N Has patient had a PCN reaction occurring within the last 10 years: N If all of the above answers are "NO", then may proceed with Cephalosporin use.   . Tetanus Toxoids Rash    Family History  Problem  Relation Age of Onset  . Heart disease Mother        CHF  . Alzheimer's disease Mother   . Alzheimer's disease Father   . Alzheimer's disease Brother   . Alzheimer's disease Paternal Uncle      Prior to Admission medications   Medication Sig Start Date End Date Taking? Authorizing Provider  amLODipine (NORVASC) 10 MG tablet TAKE ONE TABLET BY MOUTH ONCE DAILY 12/30/17  Yes Turner, Traci R, MD  carbidopa-levodopa (SINEMET CR) 50-200 MG tablet Take 1 tablet by mouth 4 (four) times daily. 7, 11, 3 pm and 7 pm 11/27/17  Yes Star Age, MD  carboxymethylcellulose (REFRESH PLUS) 0.5 % SOLN Place 1 drop into both eyes 3 (three) times daily as needed (dry eyes).   Yes [provider]  Cholecalciferol (VITAMIN D-3) 1000 UNITS CAPS Take 2,000 Units by mouth daily.    Yes [provider]  Cinnamon 500 MG capsule Take 1,000 mg by mouth daily.    Yes [provider]  clonazePAM (KLONOPIN) 0.25 MG disintegrating tablet DISSOLVE 2 TABLETS IN MOUTH AT BEDTIME 01/13/18  Yes Star Age, MD  DHA-EPA-Vitamin E 062-376-2 MG-MG-UNIT CAPS Take 2 capsules by mouth daily.    Yes [provider]  donepezil (ARICEPT) 10 MG tablet Take 1 tablet (10 mg total) by mouth daily with breakfast. 01/13/18  Yes Star Age, MD  fluticasone (FLONASE) 50 MCG/ACT nasal spray Place 2 sprays into the nose daily as needed for allergies.    Yes [provider]  levothyroxine (SYNTHROID, LEVOTHROID) 125 MCG tablet Take 125 mcg by mouth daily.     Yes [provider]  lisinopril (PRINIVIL,ZESTRIL) 40 MG tablet TAKE 1 TABLET BY MOUTH ONCE DAILY 12/02/17  Yes Turner, Eber Hong, MD  memantine (NAMENDA) 10 MG tablet Take 1 tablet (10 mg total) by mouth 2 (two) times daily. 01/13/18  Yes Star Age, MD  OVER THE COUNTER MEDICATION Take 1 tablet by mouth 2 (two) times daily. Macular degeneration supplement   Yes [provider]  OVER THE COUNTER MEDICATION Take 1 capsule by mouth  daily. Homocysteine supplement   Yes [provider]  rosuvastatin (CRESTOR) 5 MG tablet TAKE 1 TABLET BY MOUTH ONCE DAILY 02/25/18  Yes Turner, Traci R, MD  warfarin (COUMADIN) 5 MG tablet TAKE AS DIRECTED BY COUMADIN CLINIC Patient taking differently: Take 5-7.5 mg by mouth See admin instructions. Take 1 tablet (5 mg) by mouth daily except on Tuesdays Take 1.5 tablets (7.5 mg). 11/18/17  Yes Turner, Eber Hong, MD  EPIPEN 2-PAK 0.3 MG/0.3ML SOAJ Inject 0.3 mg into the muscle once as needed. Allergic reaction 02/25/13   [provider]  guaiFENesin (MUCINEX) 600 MG 12 hr tablet Take 2 tablets (1,200 mg total) by mouth 2 (two) times daily. Patient not taking: Reported on 02/25/2018 01/21/17   Oswald Hillock, MD  tamsulosin (FLOMAX) 0.4 MG CAPS capsule Take 0.4 mg by mouth daily.  02/28/18   [provider]    Physical Exam: BP (!) 153/91   Pulse (!) 58   Temp 98.1 F (36.7 C) (Oral)   Resp 16   Ht 5\' 7"  (1.702 m)   Wt 90.3 kg (199 lb)   SpO2 95%   BMI 31.17 kg/m   General: NAD, alert, chronic aphasia, oriented to self and place Eyes: No jaundice, pallor noted ENT: Normal ears, nose, mouth is moist Neck: Neck is supple Cardiovascular: S1, S2 present, irregular Respiratory: CTAB Abdomen: Soft, nontender, nondistended, bowel sounds present Skin: Normal Musculoskeletal: 2+ pitting edema bilaterally Psychiatric: Normal mood and affect Neurologic: Mild asymmetric with chronic left-sided facial droop, strength 5/5 in all extremities, no focal neurologic deficits          Labs on Admission:  Basic Metabolic Panel: Recent Labs  Lab 03/23/2018 1145  NA 146*  K 4.0  CL 111  CO2 25  GLUCOSE 106*  BUN 52*  CREATININE 3.01*  CALCIUM 9.4   Liver Function Tests: Recent Labs  Lab 02/25/2018 1145  AST 22  ALT 6*  ALKPHOS 56  BILITOT 0.7  PROT 7.2  ALBUMIN 4.0   No results for input(s): LIPASE, AMYLASE in the last 168 hours. No results for input(s): AMMONIA in  the last 168 hours. CBC: Recent Labs  Lab 03/13/2018 1145  WBC 7.5  HGB 12.7*  HCT 37.6*  MCV 91.0  PLT 135*   Cardiac Enzymes: No results for input(s): CKTOTAL, CKMB, CKMBINDEX, TROPONINI in the last 168 hours.  BNP (last 3 results) No results for input(s): BNP in the last 8760 hours.  ProBNP (last 3 results) No results for input(s): PROBNP in the last 8760 hours.  CBG: No results for input(s): GLUCAP in the last 168 hours.  Radiological Exams on Admission: US Renal  Result Date: 03/09/2018 CLINICAL DATA:  Gross hematuria. EXAM: RENAL / URINARY TRACT ULTRASOUND COMPLETE COMPARISON:  CT abdomen and pelvis 11/22/2015. FINDINGS: Right Kidney: Length: 13.2 cm. Echogenicity within normal limits. Moderate hydronephrosis. No mass visualized. Left Kidney: Length: 14.6 cm. Echogenicity within normal limits. Moderate hydronephrosis. Simple cyst in the upper pole measures 2 cm in diameter. Bladder: There is a mass lesion in the posterior, inferior aspect of the urinary bladder measuring 3.4 x 3.4 6.7 cm. It is difficult to determine if the lesion is emanating from the prostate. IMPRESSION: Moderate bilateral hydronephrosis is likely due to a mass lesion in the urinary bladder. This could be a primary bladder tumor or could be related to the prostate gland. The prostate is difficult to distinguish from the lesion. Electronically Signed   By: Inge Rise M.D.   On: 03/12/2018 17:43    EKG: None done  Assessment/Plan Present on Admission: . Hematuria, gross . AAA (abdominal aortic aneurysm) without rupture ([place]) . Persistent atrial fibrillation ([place]) . Hypertension . Hyperlipidemia . CKD (chronic kidney disease) stage 2, GFR 60-89 ml/min . OSA (obstructive sleep apnea) . Hepatic steatosis . Coronary artery disease . Parkinsonism ([place]) . Mild cognitive impairment . Hemifacial spasm  Principal Problem:   Hematuria, gross Active Problems:   AAA (abdominal aortic aneurysm) without  rupture ([facility])   Persistent atrial fibrillation ([facility])   Hypertension   Hyperlipidemia   CKD (chronic kidney disease) stage 2, GFR 60-89 ml/min   OSA (obstructive sleep apnea)   Hepatic steatosis   Coronary artery disease   Chronic anticoagulation   Parkinsonism ([facility])   Mild cognitive impairment   Hemifacial spasm  Acute gross hematuria Hemoglobin drop to 12.7, baseline  around 14 Renal ultrasound showed moderate bilateral hydronephrosis likely due to a mass lesion in the urinary bladder. This could be a primary bladder tumor or could be related to the prostate gland Gentle hydration Held Coumadin for now Urology consulted  AKI  Likely due to post-obstructive uropathy as seen on ultrasound Creatinine 3 on presentation, baseline for the past 1 year has been around 1 Avoid nephrotoxic, strict I's and O's Gentle hydration, daily BMP Urology consulted  Recent fall on chronic anticoagulation Mechanical fall, history of Parkinson's CT head negative for any acute intracranial abnormalities PT/OT  Acute blood loss anemia Hemoglobin 12.7, baseline around 14 Likely due to gross hematuria Daily CBC, monitor closely  Persistent A. fib Rate controlled, currently bradycardia Not on any rate control medication Held Coumadin due to gross hematuria, last INR 2.89, at goal  Parkinson's disease Continue Sinemet Fall precautions PT/OT  Hypertension Continue home amlodipine, held lisinopril due to AK I  Hyperlipidemia LDL at goal Continue Crestor  DM type 2 Last A1c 7.2 in 2014, will repeat Not on any medication at home  CAD status post CABG Chest pain-free  Hemifacial spasm Follows at [facility] for which he receives Botox  Hypothyroidism Continue Synthroid  OSA Compliant with CPAP  Dementia/mild cognitive impairment Continue Aricept and Namenda   DVT prophylaxis: Held Coumadin, INR at goal  Code Status: Full  Family Communication: Wife at bedside  Disposition Plan:  Once workup complete  Consults called: Urology  Admission status: Inpatient    Troy Friendly MD [facility]   If 7PM-7AM, please contact night-coverage [email or web address] Password [facility]  03/05/2018, 6:34 PM

## 2018-03-14 NOTE — ED Triage Notes (Signed)
Pt verbalizes blood in urine onset today; continued dysuria; recent treatment for same. On blood thinners.

## 2018-03-14 NOTE — ED Provider Notes (Signed)
Medical screening examination/treatment/procedure(s) were conducted as a shared visit with non-physician practitioner(s) and myself.  I personally evaluated the patient during the encounter.  Pt presented to the ED for evaluation of hematuria, decreased urine output.  In my exam , abdomen is benign.  Small umbilical hernia easily reduced.  Labs notable for hematuria, acute kidney injury and decreased hemoglobin compared to baseline.  Plan on bladder scan, evaluate for obstruction.  Will need to be admitted and evaluated further.   Dorie Rank, MD 02/26/2018 1655

## 2018-03-15 ENCOUNTER — Other Ambulatory Visit: Payer: Self-pay

## 2018-03-15 LAB — BASIC METABOLIC PANEL
ANION GAP: 11 (ref 5–15)
BUN: 47 mg/dL — ABNORMAL HIGH (ref 6–20)
CO2: 23 mmol/L (ref 22–32)
Calcium: 8.9 mg/dL (ref 8.9–10.3)
Chloride: 114 mmol/L — ABNORMAL HIGH (ref 101–111)
Creatinine, Ser: 2.72 mg/dL — ABNORMAL HIGH (ref 0.61–1.24)
GFR calc Af Amer: 24 mL/min — ABNORMAL LOW (ref >60)
GFR, EST NON AFRICAN AMERICAN: 21 mL/min — AB (ref >60)
GLUCOSE: 99 mg/dL (ref 65–99)
Potassium: 3.7 mmol/L (ref 3.5–5.1)
Sodium: 148 mmol/L — ABNORMAL HIGH (ref 135–145)

## 2018-03-15 LAB — CBC WITH DIFFERENTIAL/PLATELET
BASOS ABS: 0 10*3/uL (ref 0.0–0.1)
Basophils Relative: 0 %
Eosinophils Absolute: 0.2 10*3/uL (ref 0.0–0.7)
Eosinophils Relative: 3 %
HEMATOCRIT: 34.8 % — AB (ref 39.0–52.0)
Hemoglobin: 11.5 g/dL — ABNORMAL LOW (ref 13.0–17.0)
LYMPHS PCT: 12 %
Lymphs Abs: 0.8 10*3/uL (ref 0.7–4.0)
MCH: 30.1 pg (ref 26.0–34.0)
MCHC: 33 g/dL (ref 30.0–36.0)
MCV: 91.1 fL (ref 78.0–100.0)
Monocytes Absolute: 0.7 10*3/uL (ref 0.1–1.0)
Monocytes Relative: 10 %
NEUTROS ABS: 5.3 10*3/uL (ref 1.7–7.7)
NEUTROS PCT: 75 %
Platelets: 123 10*3/uL — ABNORMAL LOW (ref 150–400)
RBC: 3.82 MIL/uL — AB (ref 4.22–5.81)
RDW: 15.3 % (ref 11.5–15.5)
WBC: 6.9 10*3/uL (ref 4.0–10.5)

## 2018-03-15 LAB — PROTIME-INR
INR: 3.01
Prothrombin Time: 31 seconds — ABNORMAL HIGH (ref 11.4–15.2)

## 2018-03-15 LAB — HEMOGLOBIN A1C
HEMOGLOBIN A1C: 6.4 % — AB (ref 4.8–5.6)
Mean Plasma Glucose: 136.98 mg/dL

## 2018-03-15 LAB — GLUCOSE, CAPILLARY: Glucose-Capillary: 98 mg/dL (ref 65–99)

## 2018-03-15 MED ORDER — THIAMINE HCL 100 MG/ML IJ SOLN: 100.0000 mg | Freq: Every day | INTRAMUSCULAR | Status: DC

## 2018-03-15 MED ORDER — FOLIC ACID 1 MG PO TABS: 1.0000 mg | ORAL_TABLET | Freq: Every day | ORAL | Status: DC

## 2018-03-15 MED ORDER — VITAMIN B-1 100 MG PO TABS: 100.0000 mg | ORAL_TABLET | Freq: Every day | ORAL | Status: DC

## 2018-03-15 MED ORDER — TAMSULOSIN HCL 0.4 MG PO CAPS
0.4000 mg | ORAL_CAPSULE | Freq: Every day | ORAL | Status: DC
  Administered 2018-03-15 – 2018-03-19 (×5): 0.4 mg via ORAL
  Filled 2018-03-15 (×5): qty 1

## 2018-03-15 MED ORDER — LORAZEPAM 1 MG PO TABS: 1.0000 mg | ORAL_TABLET | Freq: Four times a day (QID) | ORAL | Status: DC | PRN

## 2018-03-15 MED ORDER — ADULT MULTIVITAMIN W/MINERALS CH: 1.0000 | ORAL_TABLET | Freq: Every day | ORAL | Status: DC

## 2018-03-15 MED ORDER — LORAZEPAM 2 MG/ML IJ SOLN: 1.0000 mg | Freq: Four times a day (QID) | INTRAMUSCULAR | Status: DC | PRN

## 2018-03-15 MED ORDER — SULFAMETHOXAZOLE-TRIMETHOPRIM 400-80 MG PO TABS
1.0000 | ORAL_TABLET | Freq: Two times a day (BID) | ORAL | Status: DC
  Administered 2018-03-16 (×3): 1 via ORAL
  Filled 2018-03-15 (×3): qty 1

## 2018-03-15 NOTE — Progress Notes (Signed)
PROGRESS NOTE  Troy [name] UUV:[id number] DOB: 03/31/1941 DOA: 03/18/2018 PCP: Jonathon Jordan, MD  HPI/Recap of past 24 hours: Troy [name] is a 77 y.o. male with medical history significant for Parkinson's disease, persistent A. fib on Coumadin, CAD status post CABG, AAA status post repair, CKD, hypothyroidism, hyperlipidemia, hypertension, DM, hemifacial spasm, dementia presents to the ED complaining of gross hematuria and dysuria. Patient's wife also reported a mechanical fall at a parking lot about a week ago with patient falling face forward, wife noted more confusion and disorientation over the last week. In the ED, pt noted to have AK I with creatinine of 3 and BUN 52, above baseline, hemoglobin noted to be 12.7, baseline around 14.  Renal ultrasound showed moderate bilateral hydronephrosis likely due to a mass lesion in the urinary bladder. This could be a primary bladder tumor or could be related to the prostate. Urology consulted.  Patient admitted for further management.  Today, pt reported no new symptoms, denies any chest pain, SOB, no more hematuria.   Assessment/Plan: Principal Problem:   Hematuria, gross Active Problems:   AAA (abdominal aortic aneurysm) without rupture (H[facility])   Persistent atrial fibrillation (H[facility])   Hypertension   Hyperlipidemia   CKD (chronic kidney disease) stage 2, GFR 60-89 ml/min   OSA (obstructive sleep apnea)   Hepatic steatosis   Coronary artery disease   Chronic anticoagulation   Parkinsonism ([facility])   Mild cognitive impairment   Hemifacial spasm  Acute gross hematuria Resolved Hemoglobin drop to 12.7, baseline around 14 on presentation Renal ultrasound showed moderate bilateral hydronephrosis likely due to a mass lesion in the urinary bladder. This could be a primary bladder tumor or could be related to the prostate gland Continue gentle hydration Held Coumadin for now Urology consulted: Rec placing foley, starting tamsulosin. Outpt  follow up with Dr Diona Fanti for office cystoscopy and voiding trial  Foley placed Start tamsulosin  AKI  Improving Likely due to obstructive uropathy Creatinine 3 on presentation, baseline for the past 1 year has been around 1 Avoid nephrotoxic, strict I's and O's Gentle hydration, daily BMP  Recent fall on chronic anticoagulation Mechanical fall, history of Parkinson's CT head negative for any acute intracranial abnormalities PT/OT  Acute blood loss anemia Hemoglobin 12.7, baseline around 14 Likely due to gross hematuria Daily CBC, monitor closely  Persistent A. fib Rate controlled, currently bradycardia Not on any rate control medication Held Coumadin due to gross hematuria, last INR 3, at goal  Parkinson's disease Continue Sinemet Fall precautions PT/OT  Hypertension Continue home amlodipine, held lisinopril due to AK I  Hyperlipidemia LDL at goal Continue Crestor  DM type 2 Last A1c 6.4, well controlled Not on any medication at home  CAD status post CABG Chest pain-free  Hemifacial spasm Follows [facility] for which he receives Botox  Hypothyroidism Continue Synthroid  OSA Compliant with CPAP  Dementia Continue Aricept and Namenda     Code Status: Full  Family Communication: Wife at bedside  Disposition Plan: Once creatinine significantly improves   Consultants:  Urology  Procedures:  None  Antimicrobials:  None  DVT prophylaxis: Coumadin held due to hematuria, INR at goal   Objective: Vitals:   03/22/2018 2019 03/15/18 0532 03/15/18 1025 03/15/18 1345  BP: (!) 151/90 (!) 150/87 (!) 146/74 139/83  Pulse: 64 62  64  Resp: 18 16  16   Temp: 98.9 F (37.2 C) 97.7 F (36.5 C)  98.1 F (36.7 C)  TempSrc: Oral Oral  Oral  SpO2: 96% 90%  96%  Weight:      Height:        Intake/Output Summary (Last 24 hours) at 03/15/2018 1523 Last data filed at 03/15/2018 1500 Gross per 24 hour  Intake 1730 ml  Output 1300 ml    Net 430 ml   Filed Weights   03/22/2018 1122  Weight: 90.3 kg (199 lb)    Exam:   General: NAD, alert, chronic aphasia  Cardiovascular: S1, S2 present, regular  Respiratory: CTAB  Abdomen: Soft, nontender, nondistended, bowel sounds present  Musculoskeletal: 2+ pitting edema bilaterally  Skin: Normal  Psychiatry: Normal mood   Data Reviewed: CBC: Recent Labs  Lab 03/16/2018 1145 03/15/18 0405  WBC 7.5 6.9  NEUTROABS  --  5.3  HGB 12.7* 11.5*  HCT 37.6* 34.8*  MCV 91.0 91.1  PLT 135* 297*   Basic Metabolic Panel: Recent Labs  Lab 03/08/2018 1145 03/15/18 0405  NA 146* 148*  K 4.0 3.7  CL 111 114*  CO2 25 23  GLUCOSE 106* 99  BUN 52* 47*  CREATININE 3.01* 2.72*  CALCIUM 9.4 8.9   GFR: Estimated Creatinine Clearance: 23.6 mL/min (A) (by C-G formula based on SCr of 2.72 mg/dL (H)). Liver Function Tests: Recent Labs  Lab 02/25/2018 1145  AST 22  ALT 6*  ALKPHOS 56  BILITOT 0.7  PROT 7.2  ALBUMIN 4.0   No results for input(s): LIPASE, AMYLASE in the last 168 hours. No results for input(s): AMMONIA in the last 168 hours. Coagulation Profile: Recent Labs  Lab 02/24/2018 1145 03/15/18 0405  INR 2.89 3.01   Cardiac Enzymes: No results for input(s): CKTOTAL, CKMB, CKMBINDEX, TROPONINI in the last 168 hours. BNP (last 3 results) No results for input(s): PROBNP in the last 8760 hours. HbA1C: Recent Labs    03/15/18 0405  HGBA1C 6.4*   CBG: Recent Labs  Lab 03/15/18 1026  GLUCAP 98   Lipid Profile: No results for input(s): CHOL, HDL, LDLCALC, TRIG, CHOLHDL, LDLDIRECT in the last 72 hours. Thyroid Function Tests: No results for input(s): TSH, T4TOTAL, FREET4, T3FREE, THYROIDAB in the last 72 hours. Anemia Panel: No results for input(s): VITAMINB12, FOLATE, FERRITIN, TIBC, IRON, RETICCTPCT in the last 72 hours. Urine analysis:    Component Value Date/Time   COLORURINE YELLOW 03/19/2018 1145   APPEARANCEUR HAZY (A) 03/05/2018 1145   LABSPEC  1.009 03/19/2018 1145   PHURINE 5.0 02/28/2018 1145   GLUCOSEU NEGATIVE 03/21/2018 1145   HGBUR LARGE (A) 03/02/2018 1145   BILIRUBINUR NEGATIVE 03/12/2018 1145   KETONESUR NEGATIVE 03/16/2018 1145   PROTEINUR 30 (A) 03/11/2018 1145   UROBILINOGEN 0.2 10/02/2012 1627   NITRITE NEGATIVE 03/11/2018 1145   LEUKOCYTESUR NEGATIVE 03/25/2018 1145   Sepsis Labs: @LABRCNTIP (procalcitonin:4,lacticidven:4)  )No results found for this or any previous visit (from the past 240 hour(s)).    Studies: Ct Head Wo Contrast  Result Date: 02/27/2018 CLINICAL DATA:  Fall a week ago with posttraumatic headaches, initial encounter EXAM: CT HEAD WITHOUT CONTRAST TECHNIQUE: Contiguous axial images were obtained from the base of the skull through the vertex without intravenous contrast. COMPARISON:  08/17/2017 MRI of the head, 01/19/2017 CT of the head FINDINGS: Brain: Mild atrophic changes are noted. Areas of chronic white matter ischemic change are noted as well. No acute hemorrhage, acute infarction or space-occupying mass lesion is identified. Vascular: No hyperdense vessel or unexpected calcification. Skull: Normal. Negative for fracture or focal lesion. Sinuses/Orbits: No acute finding. Other: None. IMPRESSION: Chronic atrophic and  ischemic changes without acute abnormality. Electronically Signed   By: Inez Catalina M.D.   On: 03/09/2018 19:02   US Renal  Result Date: 03/23/2018 CLINICAL DATA:  Gross hematuria. EXAM: RENAL / URINARY TRACT ULTRASOUND COMPLETE COMPARISON:  CT abdomen and pelvis 11/22/2015. FINDINGS: Right Kidney: Length: 13.2 cm. Echogenicity within normal limits. Moderate hydronephrosis. No mass visualized. Left Kidney: Length: 14.6 cm. Echogenicity within normal limits. Moderate hydronephrosis. Simple cyst in the upper pole measures 2 cm in diameter. Bladder: There is a mass lesion in the posterior, inferior aspect of the urinary bladder measuring 3.4 x 3.4 6.7 cm. It is difficult to determine if  the lesion is emanating from the prostate. IMPRESSION: Moderate bilateral hydronephrosis is likely due to a mass lesion in the urinary bladder. This could be a primary bladder tumor or could be related to the prostate gland. The prostate is difficult to distinguish from the lesion. Electronically Signed   By: Inge Rise M.D.   On: 03/17/2018 17:43    Scheduled Meds: . amLODipine  10 mg Oral Daily  . carbidopa-levodopa  1 tablet Oral 4 times per day  . cholecalciferol  2,000 Units Oral Daily  . clonazePAM  0.25 mg Oral QHS  . donepezil  10 mg Oral Q breakfast  . levothyroxine  125 mcg Oral QAC breakfast  . memantine  10 mg Oral BID  . omega-3 acid ethyl esters  1 g Oral Daily  . rosuvastatin  5 mg Oral QHS  . senna  1 tablet Oral BID  . tamsulosin  0.4 mg Oral Daily    Continuous Infusions: . sodium chloride 75 mL/hr at 03/15/18 1139     LOS: 1 day     Alma Friendly, MD [facility]   If 7PM-7AM, please contact night-coverage [email or web address] Password [facility] 03/15/2018, 3:23 PM

## 2018-03-15 NOTE — Progress Notes (Signed)
Dr. Lovena Neighbours aware via phone pt bleeding at foley insertion site and clots noted in tubing intermittently. See new order received to irrigate.

## 2018-03-15 NOTE — Progress Notes (Signed)
Spoke with patients wife who states that patient does NOT have a home cpap.  He had tried in the past to wear, however, caused headaches and multiple other issues.  Pt. Does not want a CPAP device second to inability to tolerate.  RN in room during conversation.

## 2018-03-15 NOTE — Evaluation (Signed)
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 Pt currently with functional limitations due to the deficits listed below (see PT Problem List).  Pt with limited amb distance today d/t lower abd pain, assisted to bathroom d/t episode of incontinence of stool in bed; wife present and states this is a marked change in pt status both cognitively and functionally, he is normally very active, works out with a Estate agent in [employer];  Pt will benefit from skilled PT to increase their independence and safety with mobility to allow discharge to the venue listed below.       Follow Up Recommendations Outpatient PT(vs return to trainer ??)    Equipment Recommendations  None recommended by PT    Recommendations for Other Services       Precautions / Restrictions        Mobility  Bed Mobility Overal bed mobility: Needs Assistance Bed Mobility: Supine to Sit;Sit to Supine     Supine to sit: Min guard Sit to supine: Supervision   General bed mobility comments: cues for sequencign adn incr time  Transfers Overall transfer level: Needs assistance   Transfers: Sit to/from Stand Sit to Stand: Min assist         General transfer comment: light assist to rise and facilitate anterior wt shift from toilet and bed  Ambulation/Gait Ambulation/Gait assistance: Min assist;Min guard Ambulation Distance (Feet): 11 Feet(x2) Assistive device: None;1 person hand held assist Gait Pattern/deviations: Step-through pattern;Decreased stride length;Shuffle;Narrow base of support      General Gait Details: multi-modal cues, facilitation to wt shift and maintain upright posture to incr step length  Stairs            Wheelchair Mobility    Modified Rankin (Stroke Patients Only)       Balance Overall balance assessment: Needs assistance   Sitting balance-Leahy Scale: Good       Standing balance-Leahy Scale: Fair Standing balance comment: requires UE assist to maintain balance during wt shift  Fall just prior to admission                             Pertinent Vitals/Pain Pain Assessment: Faces Faces Pain Scale: Hurts little more Pain Location: lower abd Pain Descriptors / Indicators: Discomfort;Grimacing Pain Intervention(s): Monitored during session;Other (comment)(RN aware)    Home Living Family/patient expects to be discharged to:: Private residence     Type of Home: House Home Access: Stairs to enter Entrance Stairs-Rails: Right Entrance Stairs-Number of Steps: 3   Home Equipment: Shower seat;Grab bars - toilet;Grab bars - tub/shower;Walker - 4 wheels;Walker - 2 wheels;Cane - single point;Cane - quad      Prior Function           Comments: variable amount of assistance due to dementia.  supervision to max A. Pt uses no device in the house and uses 4 wheel walker in community. Layout tight. Pt wife reports he is very active at baseline     Hand Dominance        Extremity/Trunk Assessment   Upper Extremity Assessment Upper Extremity Assessment: Overall WFL for tasks  assessed;Defer to OT evaluation    Lower Extremity Assessment Lower Extremity Assessment: Overall WFL for tasks assessed(appears grossly WFL, LEs fatigue easily)       Communication      Cognition Arousal/Alertness: Awake/alert Behavior During Therapy: WFL for tasks assessed/performed Overall Cognitive Status: History of cognitive impairments - at baseline Area of Impairment: Following commands;Problem solving                        Following Commands: Follows one step commands with increased time;Follows multi-step commands inconsistently     Problem Solving: Slow processing;Decreased initiation;Difficulty sequencing;Requires verbal cues;Requires tactile cues General Comments: per wife pt more confused than usual, difficulty understanding speech  at times; pt requires cues and direction for basic tasks      General Comments      Exercises     Assessment/Plan    PT Assessment Patient needs continued PT services  PT Problem List Decreased strength;Decreased balance;Decreased mobility;Decreased safety awareness;Decreased cognition       PT Treatment Interventions DME instruction;Gait training;Functional mobility training;Therapeutic activities;Therapeutic exercise;Patient/family education;Balance training    PT Goals (Current goals can be found in the Care Plan section)  Acute Rehab PT Goals Patient Stated Goal: pt works with a trainer who specializes in Parkinson's Disease PT Goal Formulation: With patient/family Time For Goal Achievement: 03/22/18 Potential to Achieve Goals: Good    Frequency Min 3X/week   Barriers to discharge        Co-evaluation               AM-PAC PT "6 Clicks" Daily Activity  Outcome Measure Difficulty turning over in bed (including adjusting bedclothes, sheets and blankets)?: Unable Difficulty moving from lying on back to sitting on the side of the bed? : Unable Difficulty sitting down on and standing up from a chair with arms (e.g., wheelchair, bedside commode, etc,.)?: Unable Help needed moving to and from a bed to chair (including a wheelchair)?: A Little Help needed walking in hospital room?: A Little Help needed climbing 3-5 steps with a railing? : A Little 6 Click Score: 12    End of Session Equipment Utilized During Treatment: Gait belt Activity Tolerance: Patient tolerated treatment well;Patient limited by fatigue Patient left: in bed;with family/visitor  present;with nursing/sitter in room;with call bell/phone within reach Nurse Communication: Mobility status PT Visit Diagnosis: Difficulty in walking, not elsewhere classified (R26.2);Other symptoms and signs involving the nervous system (R29.898)    Time: 1424-1450 PT Time Calculation (min) (ACUTE ONLY): 26 min   Charges:   PT Evaluation $PT Eval Low Complexity: 1 Low PT Treatments $Therapeutic Activity: 8-22 mins   PT G CodesKenyon Ana, PT Pager: [phone] 03/15/2018   [facility] 03/15/2018, 5:16 PM

## 2018-03-15 NOTE — Evaluation (Signed)
Occupational Therapy Evaluation Patient Details Name: Troy [name] MRN: [id number] DOB: 19-Feb-1941 Today's Date: 03/15/2018    History of Present Illness pt was admitted or gross hematuria.  PMH: Parkinson's, CAD, CABG, HTN, CKD, AAA repair and dementia   Clinical Impression   This 77 year old man was admitted for the above.  He has variable participation with adls at home but walks around the house without a device and min guard. Pt is currently NPO. Will follow in acute setting with goals of returning to this level.    Follow Up Recommendations  Supervision/Assistance - 24 hour    Equipment Recommendations  None recommended by OT (wife will look into a gait belt)   Recommendations for Other Services       Precautions / Restrictions Precautions Precautions: Fall Restrictions Weight Bearing Restrictions: No      Mobility Bed Mobility Overal bed mobility: Needs Assistance Bed Mobility: Supine to Sit;Sit to Supine     Supine to sit: Mod assist Sit to supine: Min assist   General bed mobility comments: assist for trunk to get up and for LLE back to bed  Transfers Overall transfer level: Needs assistance Equipment used: Rolling walker (2 wheeled) Transfers: Sit to/from Stand Sit to Stand: Min assist         General transfer comment: to steady    Balance                                           ADL either performed or assessed with clinical judgement   ADL Overall ADL's : Needs assistance/impaired Eating/Feeding: NPO(for test)   Grooming: Wash/dry hands;Minimal assistance;Standing   Upper Body Bathing: Minimal assistance;Sitting   Lower Body Bathing: Moderate assistance;Sit to/from stand   Upper Body Dressing : Minimal assistance;Sitting   Lower Body Dressing: Maximal assistance;Sit to/from stand   Toilet Transfer: Minimal assistance;Ambulation             General ADL Comments: used RW to bathroom. Pt pulls up on walker and  walks far behind it as he is used to 4 wheel walker. Multimodal cues for activities     Vision         Perception     Praxis      Pertinent Vitals/Pain       Hand Dominance Right   Extremity/Trunk Assessment Upper Extremity Assessment Upper Extremity Assessment: Overall WFL for tasks assessed           Communication Communication Communication: No difficulties   Cognition Arousal/Alertness: Awake/alert Behavior During Therapy: WFL for tasks assessed/performed Overall Cognitive Status: History of cognitive impairments - at baseline                                     General Comments       Exercises     Shoulder Instructions      Home Living Family/patient expects to be discharged to:: Private residence Living Arrangements: Spouse/significant other Available Help at Discharge: Family                   Bathroom Toilet: Handicapped height     Home Equipment: Shower seat;Grab bars - toilet;Grab bars - tub/shower;Walker - 4 wheels;Walker - 2 wheels;Cane - single point          Prior Functioning/Environment  Comments: variable amount of assistance due to dementia.  supervision to max A. Pt uses no device in the house and uses 4 wheel walker in community. Layout tight. Wife has tried cane        OT Problem List: Decreased activity tolerance;Pain;Impaired balance (sitting and/or standing);Decreased cognition;Decreased safety awareness      OT Treatment/Interventions: Self-care/ADL training;DME and/or AE instruction;Therapeutic activities;Patient/family education;Balance training    OT Goals(Current goals can be found in the care plan section) Acute Rehab OT Goals Patient Stated Goal: pt works with a Clinical research associate who specializes in [place] OT Goal Formulation: With family Time For Goal Achievement: 03/29/18 Potential to Achieve Goals: Good ADL Goals Pt Will Transfer to Toilet: with min guard  assist;ambulating(walker, high toilet with bar) Additional ADL Goal #1: pt will not have any LOB in standing during adl tasks  OT Frequency: Min 2X/week   Barriers to D/C:            Co-evaluation              AM-PAC PT "6 Clicks" Daily Activity     Outcome Measure Help from another person eating meals?: None Help from another person taking care of personal grooming?: A Little Help from another person toileting, which includes using toliet, bedpan, or urinal?: A Little Help from another person bathing (including washing, rinsing, drying)?: A Lot Help from another person to put on and taking off regular upper body clothing?: A Little Help from another person to put on and taking off regular lower body clothing?: A Lot 6 Click Score: 17   End of Session Nurse Communication: (RN reports wife has been helping pt in room)  Activity Tolerance: Patient tolerated treatment well Patient left: in bed;with call bell/phone within reach;with family/visitor present  OT Visit Diagnosis: Unsteadiness on feet (R26.81)                Time: 7741-2878 OT Time Calculation (min): 23 min Charges:  OT General Charges $OT Visit: 1 Visit OT Evaluation $OT Eval Moderate Complexity: 1 Mod G-Codes:     Gregory, OTR/L [phone] 03/15/2018  [name] 03/15/2018, 8:49 AM

## 2018-03-15 NOTE — Consult Note (Signed)
Urology Consult   Physician requesting consult: Lesia Sago, MD  Reason for consult: Urinary retention and hematuria  History of Present Illness: Troy [name] is a 77 y.o. male followed by Dr. Diona Fanti for BPH with LUTS.  He has a significant past medical history of Parkinson's disease, dementia, chronic kidney disease, AAA status post repair, A. Fib ( on Coumadin), and coronary artery disease.  The patient presented to the was a long emergency department with gross hematuria and acute urinary retention.  He had a Foley catheter placed in the emergency department and his hematuria since resolved.  However, for some reason his Foley was removed and he now has a condom catheter.    The patient was also found to have acute on chronic renal failure and bilateral hydronephrosis seen on renal ultrasound from yesterday.  He is also noted to have a "bladder lesion versus enlarged prostate" amount ultrasound.  The patient was recently evaluated in the office by Dr. Diona Fanti for BPH with lower urinary tract symptoms and was prescribed  A 2 week course of Myrbetriq then a 2 week course of tamsulosin, which the patient has not started taking.   Past Medical History:  Diagnosis Date  . AAA (abdominal aortic aneurysm) ([place])    with mural thrombus  . Chronic anticoagulation   . CKD (chronic kidney disease) stage 2, GFR 60-89 ml/min   . Constipation   . Coronary artery disease 07/2009   severe 3 vessel ASCAD s/p CABG with LIMA to LAD, SVG to diag, SVG to PL  . Dementia   . Diabetes mellitus   . Diverticulosis   . Edema extremities 09/03/2017  . Facial nerve spasm    L eye, tx'd with botox  . Fecal impaction ([place])   . Gout   . Hepatic steatosis   . Hyperlipidemia   . Hypertension   . Hypothyroidism   . OSA (obstructive sleep apnea)    intolerant to CPAP followed by Dr. Lamonte Sakai  . Parkinsonism ([place])   . Persistent atrial fibrillation [facility])     Past Surgical History:  Procedure  Laterality Date  . ABDOMINAL AORTIC ANEURYSM REPAIR  01-2010   aorto-bi-iliac Gore Excluder stent graft  . CORONARY ARTERY BYPASS GRAFT  07-2009  . EYE SURGERY Left May  2013   Cataract  . EYE SURGERY Right June 2013   Cataract  . [place]    Current Hospital Medications:  Home Meds:  Current Meds  Medication Sig  . amLODipine (NORVASC) 10 MG tablet TAKE ONE TABLET BY MOUTH ONCE DAILY  . carbidopa-levodopa (SINEMET CR) 50-200 MG tablet Take 1 tablet by mouth 4 (four) times daily. 7, 11, 3 pm and 7 pm  . carboxymethylcellulose (REFRESH PLUS) 0.5 % SOLN Place 1 drop into both eyes 3 (three) times daily as needed (dry eyes).  . Cholecalciferol (VITAMIN D-3) 1000 UNITS CAPS Take 2,000 Units by mouth daily.   . Cinnamon 500 MG capsule Take 1,000 mg by mouth daily.   . clonazePAM (KLONOPIN) 0.25 MG disintegrating tablet DISSOLVE 2 TABLETS IN MOUTH AT BEDTIME  . DHA-EPA-Vitamin E 161-096-0 MG-MG-UNIT CAPS Take 2 capsules by mouth daily.   Marland Kitchen donepezil (ARICEPT) 10 MG tablet Take 1 tablet (10 mg total) by mouth daily with breakfast.  . fluticasone (FLONASE) 50 MCG/ACT nasal spray Place 2 sprays into the nose daily as needed for allergies.   Marland Kitchen levothyroxine (SYNTHROID, LEVOTHROID) 125 MCG tablet Take 125 mcg by mouth daily.    Marland Kitchen  lisinopril (PRINIVIL,ZESTRIL) 40 MG tablet TAKE 1 TABLET BY MOUTH ONCE DAILY  . memantine (NAMENDA) 10 MG tablet Take 1 tablet (10 mg total) by mouth 2 (two) times daily.  Marland Kitchen OVER THE COUNTER MEDICATION Take 1 tablet by mouth 2 (two) times daily. Macular degeneration supplement  . OVER THE COUNTER MEDICATION Take 1 capsule by mouth daily. Homocysteine supplement  . rosuvastatin (CRESTOR) 5 MG tablet TAKE 1 TABLET BY MOUTH ONCE DAILY  . warfarin (COUMADIN) 5 MG tablet TAKE AS DIRECTED BY COUMADIN CLINIC (Patient taking differently: Take 5-7.5 mg by mouth See admin instructions. Take 1 tablet (5 mg) by mouth daily except on Tuesdays Take 1.5 tablets (7.5 mg).)     Scheduled Meds: . amLODipine  10 mg Oral Daily  . carbidopa-levodopa  1 tablet Oral 4 times per day  . cholecalciferol  2,000 Units Oral Daily  . clonazePAM  0.25 mg Oral QHS  . donepezil  10 mg Oral Q breakfast  . levothyroxine  125 mcg Oral QAC breakfast  . memantine  10 mg Oral BID  . omega-3 acid ethyl esters  1 g Oral Daily  . rosuvastatin  5 mg Oral QHS  . senna  1 tablet Oral BID   Continuous Infusions: . sodium chloride 75 mL/hr at 03/15/18 0600   PRN Meds:.acetaminophen **OR** acetaminophen, fluticasone, HYDROcodone-acetaminophen, ondansetron **OR** ondansetron (ZOFRAN) IV, polyethylene glycol, polyvinyl alcohol  Allergies:  Allergies  Allergen Reactions  . Bee Venom Anaphylaxis  . Other Anaphylaxis    Other reaction(s): Laryngeal Edema (ALLERGY) "a heart medication" ? unknown  . Tamsulosin Other (See Comments)    Insomnia  . Penicillins Rash    Has patient had a PCN reaction causing immediate rash, facial/tongue/throat swelling, SOB or lightheadedness with hypotension: Y Has patient had a PCN reaction causing severe rash involving mucus membranes or skin necrosis: Y Has patient had a PCN reaction that required hospitalization: N Has patient had a PCN reaction occurring within the last 10 years: N If all of the above answers are "NO", then may proceed with Cephalosporin use.   . Tetanus Toxoids Rash    Family History  Problem Relation Age of Onset  . Heart disease Mother        CHF  . Alzheimer's disease Mother   . Alzheimer's disease Father   . Alzheimer's disease Brother   . Alzheimer's disease Paternal Uncle     Social History:  reports that he quit smoking about 9 years ago. His smoking use included cigarettes. He quit after 15.00 years of use. He has never used smokeless tobacco. He reports that he drinks about 1.2 - 1.8 oz of alcohol per week. He reports that he does not use drugs.  ROS: A complete review of systems was performed.  All systems are  negative except for pertinent findings as noted.  Physical Exam:  Vital signs in last 24 hours: Temp:  [97.7 F (36.5 C)-98.9 F (37.2 C)] 97.7 F (36.5 C) (04/20 0532) Pulse Rate:  [57-70] 62 (04/20 0532) Resp:  [16-18] 16 (04/20 0532) BP: (136-171)/(74-91) 146/74 (04/20 1025) SpO2:  [90 %-98 %] 90 % (04/20 0532) Constitutional:  Alert and oriented, No acute distress Cardiovascular: Regular rate and rhythm, No JVD Respiratory: Normal respiratory effort, Lungs clear bilaterally GI: Abdomen is soft, nontender, nondistended, no abdominal masses GU: No CVA tenderness, condom catheter in place, urine clear.  No genital lesions. Lymphatic: No lymphadenopathy Neurologic: Grossly intact, no focal deficits Psychiatric: Normal mood and affect  Laboratory Data:  Recent Labs    03/15/2018 1145 03/15/18 0405  WBC 7.5 6.9  HGB 12.7* 11.5*  HCT 37.6* 34.8*  PLT 135* 123*    Recent Labs    02/26/2018 1145 03/15/18 0405  NA 146* 148*  K 4.0 3.7  CL 111 114*  GLUCOSE 106* 99  BUN 52* 47*  CALCIUM 9.4 8.9  CREATININE 3.01* 2.72*     Results for orders placed or performed during the hospital encounter of 02/27/2018 (from the past 24 hour(s))  Urinalysis, Routine w reflex microscopic- may I&O cath if menses     Status: Abnormal   Collection Time: 03/16/2018 11:45 AM  Result Value Ref Range   Color, Urine YELLOW YELLOW   APPearance HAZY (A) CLEAR   Specific Gravity, Urine 1.009 1.005 - 1.030   pH 5.0 5.0 - 8.0   Glucose, UA NEGATIVE NEGATIVE mg/dL   Hgb urine dipstick LARGE (A) NEGATIVE   Bilirubin Urine NEGATIVE NEGATIVE   Ketones, ur NEGATIVE NEGATIVE mg/dL   Protein, ur 30 (A) NEGATIVE mg/dL   Nitrite NEGATIVE NEGATIVE   Leukocytes, UA NEGATIVE NEGATIVE   RBC / HPF TOO NUMEROUS TO COUNT 0 - 5 RBC/hpf   WBC, UA 6-30 0 - 5 WBC/hpf   Bacteria, UA RARE (A) NONE SEEN   Squamous Epithelial / LPF NONE SEEN NONE SEEN   Sperm, UA PRESENT   CBC     Status: Abnormal   Collection Time:  02/24/2018 11:45 AM  Result Value Ref Range   WBC 7.5 4.0 - 10.5 K/uL   RBC 4.13 (L) 4.22 - 5.81 MIL/uL   Hemoglobin 12.7 (L) 13.0 - 17.0 g/dL   HCT 37.6 (L) 39.0 - 52.0 %   MCV 91.0 78.0 - 100.0 fL   MCH 30.8 26.0 - 34.0 pg   MCHC 33.8 30.0 - 36.0 g/dL   RDW 15.3 11.5 - 15.5 %   Platelets 135 (L) 150 - 400 K/uL  Basic metabolic panel     Status: Abnormal   Collection Time: 02/28/2018 11:45 AM  Result Value Ref Range   Sodium 146 (H) 135 - 145 mmol/L   Potassium 4.0 3.5 - 5.1 mmol/L   Chloride 111 101 - 111 mmol/L   CO2 25 22 - 32 mmol/L   Glucose, Bld 106 (H) 65 - 99 mg/dL   BUN 52 (H) 6 - 20 mg/dL   Creatinine, Ser 3.01 (H) 0.61 - 1.24 mg/dL   Calcium 9.4 8.9 - 10.3 mg/dL   GFR calc non Af Amer 18 (L) >60 mL/min   GFR calc Af Amer 21 (L) >60 mL/min   Anion gap 10 5 - 15  Protime-INR     Status: Abnormal   Collection Time: 03/04/2018 11:45 AM  Result Value Ref Range   Prothrombin Time 30.0 (H) 11.4 - 15.2 seconds   INR 2.89   Hepatic function panel     Status: Abnormal   Collection Time: 03/02/2018 11:45 AM  Result Value Ref Range   Total Protein 7.2 6.5 - 8.1 g/dL   Albumin 4.0 3.5 - 5.0 g/dL   AST 22 15 - 41 U/L   ALT 6 (L) 17 - 63 U/L   Alkaline Phosphatase 56 38 - 126 U/L   Total Bilirubin 0.7 0.3 - 1.2 mg/dL   Bilirubin, Direct 0.1 0.1 - 0.5 mg/dL   Indirect Bilirubin 0.6 0.3 - 0.9 mg/dL  CBC with Differential/Platelet     Status: Abnormal   Collection Time: 03/15/18  4:05 AM  Result Value Ref Range   WBC 6.9 4.0 - 10.5 K/uL   RBC 3.82 (L) 4.22 - 5.81 MIL/uL   Hemoglobin 11.5 (L) 13.0 - 17.0 g/dL   HCT 34.8 (L) 39.0 - 52.0 %   MCV 91.1 78.0 - 100.0 fL   MCH 30.1 26.0 - 34.0 pg   MCHC 33.0 30.0 - 36.0 g/dL   RDW 15.3 11.5 - 15.5 %   Platelets 123 (L) 150 - 400 K/uL   Neutrophils Relative % 75 %   Neutro Abs 5.3 1.7 - 7.7 K/uL   Lymphocytes Relative 12 %   Lymphs Abs 0.8 0.7 - 4.0 K/uL   Monocytes Relative 10 %   Monocytes Absolute 0.7 0.1 - 1.0 K/uL    Eosinophils Relative 3 %   Eosinophils Absolute 0.2 0.0 - 0.7 K/uL   Basophils Relative 0 %   Basophils Absolute 0.0 0.0 - 0.1 K/uL  Basic metabolic panel     Status: Abnormal   Collection Time: 03/15/18  4:05 AM  Result Value Ref Range   Sodium 148 (H) 135 - 145 mmol/L   Potassium 3.7 3.5 - 5.1 mmol/L   Chloride 114 (H) 101 - 111 mmol/L   CO2 23 22 - 32 mmol/L   Glucose, Bld 99 65 - 99 mg/dL   BUN 47 (H) 6 - 20 mg/dL   Creatinine, Ser 2.72 (H) 0.61 - 1.24 mg/dL   Calcium 8.9 8.9 - 10.3 mg/dL   GFR calc non Af Amer 21 (L) >60 mL/min   GFR calc Af Amer 24 (L) >60 mL/min   Anion gap 11 5 - 15  Protime-INR     Status: Abnormal   Collection Time: 03/15/18  4:05 AM  Result Value Ref Range   Prothrombin Time 31.0 (H) 11.4 - 15.2 seconds   INR 3.01   Hemoglobin A1c     Status: Abnormal   Collection Time: 03/15/18  4:05 AM  Result Value Ref Range   Hgb A1c MFr Bld 6.4 (H) 4.8 - 5.6 %   Mean Plasma Glucose 136.98 mg/dL  Glucose, capillary     Status: None   Collection Time: 03/15/18 10:26 AM  Result Value Ref Range   Glucose-Capillary 98 65 - 99 mg/dL   No results found for this or any previous visit (from the past 240 hour(s)).  Renal Function: Recent Labs    03/12/2018 1145 03/15/18 0405  CREATININE 3.01* 2.72*   Estimated Creatinine Clearance: 23.6 mL/min (A) (by C-G formula based on SCr of 2.72 mg/dL (H)).  Radiologic Imaging: Ct Head Wo Contrast  Result Date: 03/16/2018 CLINICAL DATA:  Fall a week ago with posttraumatic headaches, initial encounter EXAM: CT HEAD WITHOUT CONTRAST TECHNIQUE: Contiguous axial images were obtained from the base of the skull through the vertex without intravenous contrast. COMPARISON:  08/17/2017 MRI of the head, 01/19/2017 CT of the head FINDINGS: Brain: Mild atrophic changes are noted. Areas of chronic white matter ischemic change are noted as well. No acute hemorrhage, acute infarction or space-occupying mass lesion is identified. Vascular: No  hyperdense vessel or unexpected calcification. Skull: Normal. Negative for fracture or focal lesion. Sinuses/Orbits: No acute finding. Other: None. IMPRESSION: Chronic atrophic and ischemic changes without acute abnormality. Electronically Signed   By: Inez Catalina M.D.   On: 03/03/2018 19:02   US Renal  Result Date: 03/18/2018 CLINICAL DATA:  Gross hematuria. EXAM: RENAL / URINARY TRACT ULTRASOUND COMPLETE COMPARISON:  CT abdomen and pelvis 11/22/2015. FINDINGS: Right Kidney: Length: 13.2 cm.  Echogenicity within normal limits. Moderate hydronephrosis. No mass visualized. Left Kidney: Length: 14.6 cm. Echogenicity within normal limits. Moderate hydronephrosis. Simple cyst in the upper pole measures 2 cm in diameter. Bladder: There is a mass lesion in the posterior, inferior aspect of the urinary bladder measuring 3.4 x 3.4 6.7 cm. It is difficult to determine if the lesion is emanating from the prostate. IMPRESSION: Moderate bilateral hydronephrosis is likely due to a mass lesion in the urinary bladder. This could be a primary bladder tumor or could be related to the prostate gland. The prostate is difficult to distinguish from the lesion. Electronically Signed   By: Inge Rise M.D.   On: 03/06/2018 17:43    I independently reviewed the above imaging studies.  Impression/Recommendation 1.  Acute urinary retention 2.  Gross hematuria-resolving 3.  Acute on chronic renal failure likely secondary to obstructive uropathy 4.  Bladder mass vs prostatic median lobe seen on bladder ultrasound 5.  Bilateral hydronephrosis likely secondary to obstructive uropathy 6.  History of Parkinson's and dementia  -Recommend placing an indwelling Foley catheter and starting tamsulosin once daily.  Will arrange outpatient follow-up Dr. Diona Fanti for an office cystoscopy and voiding trial next week. -Continue to monitor renal function.  His acute on chronic renal failure and bilateral hydronephrosis is likely 2/2  obstructive uropathy.  Ellison Hughs, MD [facility] 03/15/2018, 11:35 AM

## 2018-03-16 LAB — URINE CULTURE: CULTURE: NO GROWTH

## 2018-03-16 LAB — CBC WITH DIFFERENTIAL/PLATELET
BASOS PCT: 0 %
Basophils Absolute: 0 10*3/uL (ref 0.0–0.1)
EOS ABS: 0.1 10*3/uL (ref 0.0–0.7)
Eosinophils Relative: 1 %
HCT: 28.8 % — ABNORMAL LOW (ref 39.0–52.0)
HEMOGLOBIN: 9.7 g/dL — AB (ref 13.0–17.0)
Lymphocytes Relative: 8 %
Lymphs Abs: 0.8 10*3/uL (ref 0.7–4.0)
MCH: 30.8 pg (ref 26.0–34.0)
MCHC: 33.7 g/dL (ref 30.0–36.0)
MCV: 91.4 fL (ref 78.0–100.0)
MONOS PCT: 8 %
Monocytes Absolute: 0.8 10*3/uL (ref 0.1–1.0)
Neutro Abs: 8.3 10*3/uL — ABNORMAL HIGH (ref 1.7–7.7)
Neutrophils Relative %: 83 %
Platelets: 123 10*3/uL — ABNORMAL LOW (ref 150–400)
RBC: 3.15 MIL/uL — AB (ref 4.22–5.81)
RDW: 15.6 % — ABNORMAL HIGH (ref 11.5–15.5)
WBC: 9.9 10*3/uL (ref 4.0–10.5)

## 2018-03-16 LAB — PROTIME-INR
INR: 2.85
PROTHROMBIN TIME: 29.7 s — AB (ref 11.4–15.2)

## 2018-03-16 LAB — BASIC METABOLIC PANEL
Anion gap: 11 (ref 5–15)
BUN: 52 mg/dL — ABNORMAL HIGH (ref 6–20)
CHLORIDE: 114 mmol/L — AB (ref 101–111)
CO2: 22 mmol/L (ref 22–32)
CREATININE: 3.3 mg/dL — AB (ref 0.61–1.24)
Calcium: 8.8 mg/dL — ABNORMAL LOW (ref 8.9–10.3)
GFR, EST AFRICAN AMERICAN: 19 mL/min — AB (ref >60)
GFR, EST NON AFRICAN AMERICAN: 16 mL/min — AB (ref >60)
Glucose, Bld: 128 mg/dL — ABNORMAL HIGH (ref 65–99)
Potassium: 3.9 mmol/L (ref 3.5–5.1)
SODIUM: 147 mmol/L — AB (ref 135–145)

## 2018-03-16 LAB — HEMOGLOBIN AND HEMATOCRIT, BLOOD
HEMATOCRIT: 23.5 % — AB (ref 39.0–52.0)
Hemoglobin: 7.7 g/dL — ABNORMAL LOW (ref 13.0–17.0)

## 2018-03-16 LAB — GLUCOSE, CAPILLARY: Glucose-Capillary: 151 mg/dL — ABNORMAL HIGH (ref 65–99)

## 2018-03-16 MED ORDER — SODIUM CHLORIDE 0.9 % IV BOLUS
1000.0000 mL | Freq: Once | INTRAVENOUS | Status: AC
  Administered 2018-03-16: 1000 mL via INTRAVENOUS

## 2018-03-16 NOTE — Progress Notes (Signed)
Notified Dr Lovena Neighbours that 4th floor nurses we as unsuccessful in their attempts as this Probation officer had previously been. Dr Lovena Neighbours requested several supplies to be available upon his arrival to the department. Items obtained and waiting.

## 2018-03-16 NOTE — Progress Notes (Addendum)
Subjective: The patient's 79 French Foley catheter continues to clot off.  I exchanged his 26 French catheter for a 24 [place] three-way Foley and hand irrigated until his urine returned light pink with CBI.  His pain is much improved improved after catheter irrigation.  Objective: Vital signs in last 24 hours: Temp:  [97.7 F (36.5 C)-98.7 F (37.1 C)] 98.7 F (37.1 C) (04/21 1035) Pulse Rate:  [64-73] 70 (04/21 1035) Resp:  [14-18] 18 (04/21 1035) BP: (90-139)/(61-86) 90/61 (04/21 1035) SpO2:  [93 %-98 %] 94 % (04/21 1035)  Intake/Output from previous day: 04/20 0701 - 04/21 0700 In: 1600 [P.O.:600; I.V.:1000] Out: 1750 [Urine:1750]  Intake/Output this shift: Total I/O In: 8140 [P.O.:240; Other:7900] Out: 9675 [Urine:9675]  Physical Exam:  General: Alert and oriented CV: RRR, palpable distal pulses Lungs: CTAB, equal chest rise Abdomen: Soft, NTND, no rebound or guarding Gu: 24 French three-way Foley catheter in place and draining blood-tinged urine with CBI Ext: NT, No erythema  Lab Results: Recent Labs    03/21/2018 1145 03/15/18 0405 03/16/18 0442  HGB 12.7* 11.5* 9.7*  HCT 37.6* 34.8* 28.8*   BMET Recent Labs    03/15/18 0405 03/16/18 0442  NA 148* 147*  K 3.7 3.9  CL 114* 114*  CO2 23 22  GLUCOSE 99 128*  BUN 47* 52*  CREATININE 2.72* 3.30*  CALCIUM 8.9 8.8*     Studies/Results: Ct Head Wo Contrast  Result Date: 03/16/2018 CLINICAL DATA:  Fall a week ago with posttraumatic headaches, initial encounter EXAM: CT HEAD WITHOUT CONTRAST TECHNIQUE: Contiguous axial images were obtained from the base of the skull through the vertex without intravenous contrast. COMPARISON:  08/17/2017 MRI of the head, 01/19/2017 CT of the head FINDINGS: Brain: Mild atrophic changes are noted. Areas of chronic white matter ischemic change are noted as well. No acute hemorrhage, acute infarction or space-occupying mass lesion is identified. Vascular: No hyperdense vessel or  unexpected calcification. Skull: Normal. Negative for fracture or focal lesion. Sinuses/Orbits: No acute finding. Other: None. IMPRESSION: Chronic atrophic and ischemic changes without acute abnormality. Electronically Signed   By: Inez Catalina M.D.   On: 03/03/2018 19:02   US Renal  Result Date: 03/22/2018 CLINICAL DATA:  Gross hematuria. EXAM: RENAL / URINARY TRACT ULTRASOUND COMPLETE COMPARISON:  CT abdomen and pelvis 11/22/2015. FINDINGS: Right Kidney: Length: 13.2 cm. Echogenicity within normal limits. Moderate hydronephrosis. No mass visualized. Left Kidney: Length: 14.6 cm. Echogenicity within normal limits. Moderate hydronephrosis. Simple cyst in the upper pole measures 2 cm in diameter. Bladder: There is a mass lesion in the posterior, inferior aspect of the urinary bladder measuring 3.4 x 3.4 6.7 cm. It is difficult to determine if the lesion is emanating from the prostate. IMPRESSION: Moderate bilateral hydronephrosis is likely due to a mass lesion in the urinary bladder. This could be a primary bladder tumor or could be related to the prostate gland. The prostate is difficult to distinguish from the lesion. Electronically Signed   By: Inge Rise M.D.   On: 02/24/2018 17:43    Assessment/Plan:  1.  Urinary retention--Hx of BPH, currently on tamsulosin 2.  Gross hematuria- replaced 20 French Foley catheter with a 24 French three-way Foley catheter and hand irrigated a large amount of clots out of the bladder.  His catheter is running light pink with CBI.  Urine cx from yesterday was negative.  Currently on PO Bactrim 2/2 extensive catheter manipulation.  3.  Acute on chronic renal failure-likely secondary to obstructive uropathy  4.  Bladder mass vs prostatic median lobe vs blood clot seen on bladder ultrasound 5.  Bilateral hydronephrosis likely secondary to obstructive uropathy 6.  History of Parkinson's disease and dementia 7.  Blood loss anemia secondary to gross hematuria.  His INR  is 2.85.  His hematuria will not likely resolve until his INR is corrected.  -CBI as needed for gross hematuria.  Hand irrigate Foley catheter as needed for blood clots or decreased output. -Will continue to monitor.  Make n.p.o. at midnight in case he needs a clot evacuation tomorrow.   LOS: 2 days   Ellison Hughs, MD [facility] Pager: [phone]  03/16/2018, 11:27 AM

## 2018-03-16 NOTE — Progress Notes (Signed)
Dr Lovena Neighbours here, irrigating patient's catheter at this time. After numerous attempts was able to connect to bedside drainage bag and have a flow of urine and irrigation solution draining. Dr requested that patient be placed on NPO status after midnight, just in case he should need to go to surgery in the morning. Patient and wife instructed on diet change at this time. MD also requested to have CBI continue to patient to prevent clots from forming and preventing the drainage of urine.

## 2018-03-16 NOTE — Progress Notes (Signed)
Catheter changed at this time to #20 coude three way catheter, and connected to bedside drainage bag. Sterile procedure used for process. Pt tolerated procedure with minimal c/o voiced. Urine remains bloody at this time, with numerous small clots obtained. Continuing to irrigate catheter to allow catheter to drain into bedside drainage without irrigating process. Attempted irrigating by hand with large irrigation syringe and continuing to get blood clots. 2145  Notified Dr Lovena Neighbours, who suggested that I contacted nurses on the 4th floor to come down and "attempt to irrigate catheter." Nurses notified and stated they would be down in approximately 30 minutes.

## 2018-03-16 NOTE — Progress Notes (Signed)
PROGRESS NOTE  Troy [name] KNL:[id number] DOB: [birth year] DOA: 03/10/2018 PCP: Jonathon Jordan, MD  HPI/Recap of past 24 hours: Troy [name] is a 78 y.o. male with medical history significant for Parkinson's disease, persistent A. fib on Coumadin, CAD status post CABG, AAA status post repair, CKD, hypothyroidism, hyperlipidemia, hypertension, DM, hemifacial spasm, dementia presents to the ED complaining of gross hematuria and dysuria. Patient's wife also reported a mechanical fall at a parking lot about a week ago with patient falling face forward, wife noted more confusion and disorientation over the last week. In the ED, pt noted to have AK I with creatinine of 3 and BUN 52, above baseline, hemoglobin noted to be 12.7, baseline around 14.  Renal ultrasound showed moderate bilateral hydronephrosis likely due to a mass lesion in the urinary bladder. This could be a primary bladder tumor or could be related to the prostate. Urology consulted.  Patient admitted for further management.  Overnight, pt foley continued to clot off and required multiple irrigation. Was noted to be in pain intermittently due to the clots. Urine is currently light pink. Denies any chest pain, SOB   Assessment/Plan: Principal Problem:   Hematuria, gross Active Problems:   AAA (abdominal aortic aneurysm) without rupture (HCC)   Persistent atrial fibrillation (HCC)   Hypertension   Hyperlipidemia   CKD (chronic kidney disease) stage 2, GFR 60-89 ml/min   OSA (obstructive sleep apnea)   Hepatic steatosis   Coronary artery disease   Chronic anticoagulation   Parkinsonism (HCC)   Mild cognitive impairment   Hemifacial spasm  Acute gross hematuria/bladder mass/bilateral hydronephrosis Ongoing Renal ultrasound showed moderate bilateral hydronephrosis likely due to a mass lesion in the urinary bladder. This could be a primary bladder tumor or could be related to the prostate gland Continue gentle hydration Held  Coumadin Urology on board: Continue CBI, UC negative. Placed on PO bactrim due to extensive catheter manipulation. NPO for possible clot evacuation Foley placed Start tamsulosin  AKI  Ongoing Likely due to obstructive uropathy Creatinine 3 on presentation, baseline for the past 1 year has been around 1 Avoid nephrotoxic, strict I's and O's Gentle hydration, daily BMP Monitor renal fxn closely as pt is now on bactrim  Urinary retention/BPH Started on tamsulosin  Recent fall on chronic anticoagulation Mechanical fall, history of Parkinson's CT head negative for any acute intracranial abnormalities PT/OT  Acute blood loss anemia 2/2 gross hematuria Hemoglobin dropping, baseline around 14 Type and screen in am, transfuse if <7 Daily CBC, monitor closely  Persistent A. fib Rate controlled, currently bradycardia Not on any rate control medication Held Coumadin due to gross hematuria Daily INR  Parkinson's disease Continue Sinemet Fall precautions PT/OT  Hypertension Continue home amlodipine, held lisinopril due to AK I  Hyperlipidemia LDL at goal Continue Crestor  DM type 2 Last A1c 6.4, well controlled Not on any medication at home  CAD status post CABG Chest pain-free  Hemifacial spasm Follows at [facility] for which he receives Botox  Hypothyroidism Continue Synthroid  OSA Not compliant with CPAP  Dementia Continue Aricept and Namenda     Code Status: Full  Family Communication: None at bedside  Disposition Plan: Once creatine improves and per urology   Consultants:  Urology  Procedures:  None  Antimicrobials:  PO Bactrim  DVT prophylaxis: Coumadin held due to hematuria   Objective: Vitals:   03/16/18 0521 03/16/18 1035 03/16/18 1233 03/16/18 1348  BP: 133/86 90/61 127/74 125/74  Pulse: 73 70  78  Resp: 17 18  18   Temp: 97.7 F (36.5 C) 98.7 F (37.1 C)  98.4 F (36.9 C)  TempSrc: Oral Oral  Oral  SpO2: 98% 94%   95%  Weight:      Height:        Intake/Output Summary (Last 24 hours) at 03/16/2018 1552 Last data filed at 03/16/2018 1550 Gross per 24 hour  Intake 24060 ml  Output 32325 ml  Net -8265 ml   Filed Weights   03/01/2018 1122  Weight: 90.3 kg (199 lb)    Exam:   General: NAD, alert, chronic aphasia  Cardiovascular: S1, S2 present, regular  Respiratory: CTAB  Abdomen: Soft, +tender, nondistended, bowel sounds present  Musculoskeletal: 2+ pitting edema bilaterally  Skin: Normal  Psychiatry: Normal mood   Data Reviewed: CBC: Recent Labs  Lab 02/27/2018 1145 03/15/18 0405 03/16/18 0442  WBC 7.5 6.9 9.9  NEUTROABS  --  5.3 8.3*  HGB 12.7* 11.5* 9.7*  HCT 37.6* 34.8* 28.8*  MCV 91.0 91.1 91.4  PLT 135* 123* 962*   Basic Metabolic Panel: Recent Labs  Lab 03/03/2018 1145 03/15/18 0405 03/16/18 0442  NA 146* 148* 147*  K 4.0 3.7 3.9  CL 111 114* 114*  CO2 25 23 22   GLUCOSE 106* 99 128*  BUN 52* 47* 52*  CREATININE 3.01* 2.72* 3.30*  CALCIUM 9.4 8.9 8.8*   GFR: Estimated Creatinine Clearance: 19.5 mL/min (A) (by C-G formula based on SCr of 3.3 mg/dL (H)). Liver Function Tests: Recent Labs  Lab 03/09/2018 1145  AST 22  ALT 6*  ALKPHOS 56  BILITOT 0.7  PROT 7.2  ALBUMIN 4.0   No results for input(s): LIPASE, AMYLASE in the last 168 hours. No results for input(s): AMMONIA in the last 168 hours. Coagulation Profile: Recent Labs  Lab 03/17/2018 1145 03/15/18 0405 03/16/18 0442  INR 2.89 3.01 2.85   Cardiac Enzymes: No results for input(s): CKTOTAL, CKMB, CKMBINDEX, TROPONINI in the last 168 hours. BNP (last 3 results) No results for input(s): PROBNP in the last 8760 hours. HbA1C: Recent Labs    03/15/18 0405  HGBA1C 6.4*   CBG: Recent Labs  Lab 03/15/18 1026 03/16/18 0821  GLUCAP 98 151*   Lipid Profile: No results for input(s): CHOL, HDL, LDLCALC, TRIG, CHOLHDL, LDLDIRECT in the last 72 hours. Thyroid Function Tests: No results for  input(s): TSH, T4TOTAL, FREET4, T3FREE, THYROIDAB in the last 72 hours. Anemia Panel: No results for input(s): VITAMINB12, FOLATE, FERRITIN, TIBC, IRON, RETICCTPCT in the last 72 hours. Urine analysis:    Component Value Date/Time   COLORURINE YELLOW 03/03/2018 1145   APPEARANCEUR HAZY (A) 03/17/2018 1145   LABSPEC 1.009 02/26/2018 1145   PHURINE 5.0 03/25/2018 1145   GLUCOSEU NEGATIVE 02/28/2018 1145   HGBUR LARGE (A) 03/10/2018 1145   BILIRUBINUR NEGATIVE 03/13/2018 1145   KETONESUR NEGATIVE 03/15/2018 1145   PROTEINUR 30 (A) 03/25/2018 1145   UROBILINOGEN 0.2 10/02/2012 1627   NITRITE NEGATIVE 03/12/2018 1145   LEUKOCYTESUR NEGATIVE 03/22/2018 1145   Sepsis Labs: @LABRCNTIP (procalcitonin:4,lacticidven:4)  ) Recent Results (from the past 240 hour(s))  Culture, Urine     Status: None   Collection Time: 03/15/18 11:47 AM  Result Value Ref Range Status   Specimen Description   Final    URINE, CLEAN CATCH Performed at [facility], [place] [address], [place], [place] [address]    Special Requests   Final    NONE Performed at [facility], [place]  [place], [place] [address]    Culture   Final    NO GROWTH Performed at [facility], [place] [address] [place], [place] [address]    Report Status 03/16/2018 FINAL  Final      Studies: No results found.  Scheduled Meds: . amLODipine  10 mg Oral Daily  . carbidopa-levodopa  1 tablet Oral 4 times per day  . cholecalciferol  2,000 Units Oral Daily  . clonazePAM  0.25 mg Oral QHS  . donepezil  10 mg Oral Q breakfast  . levothyroxine  125 mcg Oral QAC breakfast  . memantine  10 mg Oral BID  . omega-3 acid ethyl esters  1 g Oral Daily  . rosuvastatin  5 mg Oral QHS  . senna  1 tablet Oral BID  . sulfamethoxazole-trimethoprim  1 tablet Oral Q12H  . tamsulosin  0.4 mg Oral Daily    Continuous Infusions: . sodium chloride 50 mL/hr at 03/15/18 1549     LOS: 2 days      Alma Friend[place], MD [facility]   If 7PM-7AM, please contact night-coverage [email or web address] Password [facility] 03/16/2018, 3:52 PM

## 2018-03-16 NOTE — Progress Notes (Signed)
Pt. Continues to refuse CPAP at this time.  Will make available if patient changes his mind.

## 2018-03-17 ENCOUNTER — Inpatient Hospital Stay (HOSPITAL_COMMUNITY): Payer: Medicare Other

## 2018-03-17 ENCOUNTER — Ambulatory Visit (HOSPITAL_COMMUNITY)
Admit: 2018-03-17 | Discharge: 2018-03-17 | Disposition: A | Discharge status: Home / Self Care | Payer: Medicare Other | Attending: Critical Care Medicine | Admitting: Critical Care Medicine

## 2018-03-17 ENCOUNTER — Encounter (HOSPITAL_COMMUNITY): Payer: Self-pay | Admitting: Internal Medicine

## 2018-03-17 DIAGNOSIS — G931 Anoxic brain damage, not elsewhere classified: Secondary | ICD-10-CM

## 2018-03-17 DIAGNOSIS — N179 Acute kidney failure, unspecified: Principal | ICD-10-CM

## 2018-03-17 DIAGNOSIS — R579 Shock, unspecified: Secondary | ICD-10-CM

## 2018-03-17 DIAGNOSIS — I469 Cardiac arrest, cause unspecified: Secondary | ICD-10-CM

## 2018-03-17 DIAGNOSIS — J9601 Acute respiratory failure with hypoxia: Secondary | ICD-10-CM

## 2018-03-17 DIAGNOSIS — D500 Iron deficiency anemia secondary to blood loss (chronic): Secondary | ICD-10-CM

## 2018-03-17 DIAGNOSIS — R310 Gross hematuria: Secondary | ICD-10-CM

## 2018-03-17 DIAGNOSIS — J9602 Acute respiratory failure with hypercapnia: Secondary | ICD-10-CM

## 2018-03-17 DIAGNOSIS — I6782 Cerebral ischemia: Secondary | ICD-10-CM | POA: Diagnosis not present

## 2018-03-17 DIAGNOSIS — N1339 Other hydronephrosis: Secondary | ICD-10-CM

## 2018-03-17 DIAGNOSIS — R791 Abnormal coagulation profile: Secondary | ICD-10-CM

## 2018-03-17 LAB — BASIC METABOLIC PANEL
ANION GAP: 13 (ref 5–15)
ANION GAP: 14 (ref 5–15)
ANION GAP: 10 (ref 5–15)
Anion gap: 8 (ref 5–15)
BUN: 60 mg/dL — ABNORMAL HIGH (ref 6–20)
BUN: 61 mg/dL — ABNORMAL HIGH (ref 6–20)
BUN: 55 mg/dL — AB (ref 6–20)
BUN: 56 mg/dL — ABNORMAL HIGH (ref 6–20)
CALCIUM: 7.2 mg/dL — AB (ref 8.9–10.3)
CALCIUM: 6.9 mg/dL — AB (ref 8.9–10.3)
CHLORIDE: 114 mmol/L — AB (ref 101–111)
CHLORIDE: 112 mmol/L — AB (ref 101–111)
CO2: 18 mmol/L — ABNORMAL LOW (ref 22–32)
CO2: 19 mmol/L — ABNORMAL LOW (ref 22–32)
CO2: 20 mmol/L — ABNORMAL LOW (ref 22–32)
CO2: 22 mmol/L (ref 22–32)
Calcium: 7.1 mg/dL — ABNORMAL LOW (ref 8.9–10.3)
Calcium: 6.9 mg/dL — ABNORMAL LOW (ref 8.9–10.3)
Chloride: 116 mmol/L — ABNORMAL HIGH (ref 101–111)
Chloride: 114 mmol/L — ABNORMAL HIGH (ref 101–111)
Creatinine, Ser: 4.23 mg/dL — ABNORMAL HIGH (ref 0.61–1.24)
Creatinine, Ser: 4.24 mg/dL — ABNORMAL HIGH (ref 0.61–1.24)
Creatinine, Ser: 4.52 mg/dL — ABNORMAL HIGH (ref 0.61–1.24)
Creatinine, Ser: 4.64 mg/dL — ABNORMAL HIGH (ref 0.61–1.24)
GFR calc Af Amer: 14 mL/min — ABNORMAL LOW (ref >60)
GFR calc Af Amer: 13 mL/min — ABNORMAL LOW (ref >60)
GFR calc non Af Amer: 12 mL/min — ABNORMAL LOW (ref >60)
GFR calc non Af Amer: 11 mL/min — ABNORMAL LOW (ref >60)
GFR, EST AFRICAN AMERICAN: 14 mL/min — AB (ref >60)
GFR, EST AFRICAN AMERICAN: 13 mL/min — AB (ref >60)
GFR, EST NON AFRICAN AMERICAN: 12 mL/min — AB (ref >60)
GFR, EST NON AFRICAN AMERICAN: 11 mL/min — AB (ref >60)
GLUCOSE: 194 mg/dL — AB (ref 65–99)
GLUCOSE: 203 mg/dL — AB (ref 65–99)
GLUCOSE: 107 mg/dL — AB (ref 65–99)
Glucose, Bld: 161 mg/dL — ABNORMAL HIGH (ref 65–99)
POTASSIUM: 4.3 mmol/L (ref 3.5–5.1)
POTASSIUM: 4.4 mmol/L (ref 3.5–5.1)
Potassium: 4.4 mmol/L (ref 3.5–5.1)
Potassium: 4.1 mmol/L (ref 3.5–5.1)
Sodium: 146 mmol/L — ABNORMAL HIGH (ref 135–145)
Sodium: 148 mmol/L — ABNORMAL HIGH (ref 135–145)
Sodium: 142 mmol/L (ref 135–145)
Sodium: 144 mmol/L (ref 135–145)

## 2018-03-17 LAB — CBC WITH DIFFERENTIAL/PLATELET
Basophils Absolute: 0 10*3/uL (ref 0.0–0.1)
Basophils Relative: 0 %
EOS ABS: 0.1 10*3/uL (ref 0.0–0.7)
Eosinophils Relative: 0 %
HCT: 19.9 % — ABNORMAL LOW (ref 39.0–52.0)
HEMOGLOBIN: 6.6 g/dL — AB (ref 13.0–17.0)
LYMPHS ABS: 1.8 10*3/uL (ref 0.7–4.0)
Lymphocytes Relative: 13 %
MCH: 30.8 pg (ref 26.0–34.0)
MCHC: 33.2 g/dL (ref 30.0–36.0)
MCV: 93 fL (ref 78.0–100.0)
MONOS PCT: 7 %
Monocytes Absolute: 0.9 10*3/uL (ref 0.1–1.0)
NEUTROS PCT: 80 %
Neutro Abs: 11 10*3/uL — ABNORMAL HIGH (ref 1.7–7.7)
Platelets: 114 10*3/uL — ABNORMAL LOW (ref 150–400)
RBC: 2.14 MIL/uL — ABNORMAL LOW (ref 4.22–5.81)
RDW: 15.7 % — ABNORMAL HIGH (ref 11.5–15.5)
WBC: 13.8 10*3/uL — ABNORMAL HIGH (ref 4.0–10.5)

## 2018-03-17 LAB — TYPE AND SCREEN
ABO/RH(D): A POS
Antibody Screen: NEGATIVE
UNIT DIVISION: 0

## 2018-03-17 LAB — PROTIME-INR
INR: 3.12
INR: 3.02
INR: 2.53
PROTHROMBIN TIME: 31.8 s — AB (ref 11.4–15.2)
PROTHROMBIN TIME: 31 s — AB (ref 11.4–15.2)
PROTHROMBIN TIME: 27 s — AB (ref 11.4–15.2)

## 2018-03-17 LAB — COMPREHENSIVE METABOLIC PANEL
ALK PHOS: 35 U/L — AB (ref 38–126)
ALT: 6 U/L — ABNORMAL LOW (ref 17–63)
ANION GAP: 12 (ref 5–15)
AST: 23 U/L (ref 15–41)
Albumin: 2.5 g/dL — ABNORMAL LOW (ref 3.5–5.0)
BILIRUBIN TOTAL: 0.4 mg/dL (ref 0.3–1.2)
BUN: 61 mg/dL — ABNORMAL HIGH (ref 6–20)
CALCIUM: 7.6 mg/dL — AB (ref 8.9–10.3)
CO2: 15 mmol/L — ABNORMAL LOW (ref 22–32)
Chloride: 117 mmol/L — ABNORMAL HIGH (ref 101–111)
Creatinine, Ser: 4.46 mg/dL — ABNORMAL HIGH (ref 0.61–1.24)
GFR calc Af Amer: 13 mL/min — ABNORMAL LOW (ref >60)
GFR calc non Af Amer: 11 mL/min — ABNORMAL LOW (ref >60)
Glucose, Bld: 211 mg/dL — ABNORMAL HIGH (ref 65–99)
Potassium: 4.1 mmol/L (ref 3.5–5.1)
Sodium: 144 mmol/L (ref 135–145)
TOTAL PROTEIN: 4.6 g/dL — AB (ref 6.5–8.1)

## 2018-03-17 LAB — BPAM FFP
Blood Product Expiration Date: 201904272359
UNIT TYPE AND RH: 6200

## 2018-03-17 LAB — CBC
HCT: 19.2 % — ABNORMAL LOW (ref 39.0–52.0)
HCT: 18.6 % — ABNORMAL LOW (ref 39.0–52.0)
Hemoglobin: 6.5 g/dL — CL (ref 13.0–17.0)
Hemoglobin: 6.2 g/dL — CL (ref 13.0–17.0)
MCH: 31.1 pg (ref 26.0–34.0)
MCH: 30.4 pg (ref 26.0–34.0)
MCHC: 33.9 g/dL (ref 30.0–36.0)
MCHC: 33.3 g/dL (ref 30.0–36.0)
MCV: 91.9 fL (ref 78.0–100.0)
MCV: 91.2 fL (ref 78.0–100.0)
PLATELETS: 126 10*3/uL — AB (ref 150–400)
PLATELETS: 128 10*3/uL — AB (ref 150–400)
RBC: 2.09 MIL/uL — AB (ref 4.22–5.81)
RBC: 2.04 MIL/uL — AB (ref 4.22–5.81)
RDW: 15.6 % — ABNORMAL HIGH (ref 11.5–15.5)
RDW: 16 % — ABNORMAL HIGH (ref 11.5–15.5)
WBC: 18.5 10*3/uL — AB (ref 4.0–10.5)
WBC: 15.6 10*3/uL — ABNORMAL HIGH (ref 4.0–10.5)

## 2018-03-17 LAB — POCT I-STAT 3, ART BLOOD GAS (G3+)
Acid-base deficit: 3 mmol/L — ABNORMAL HIGH (ref 0.0–2.0)
Bicarbonate: 23 mmol/L (ref 20.0–28.0)
O2 SAT: 98 %
PCO2 ART: 41.4 mmHg (ref 32.0–48.0)
PO2 ART: 116 mmHg — AB (ref 83.0–108.0)
Patient temperature: 36
TCO2: 24 mmol/L (ref 22–32)
pH, Arterial: 7.349 — ABNORMAL LOW (ref 7.350–7.450)

## 2018-03-17 LAB — BLOOD GAS, ARTERIAL
ACID-BASE DEFICIT: 12.1 mmol/L — AB (ref 0.0–2.0)
Acid-base deficit: 10.1 mmol/L — ABNORMAL HIGH (ref 0.0–2.0)
BICARBONATE: 16.8 mmol/L — AB (ref 20.0–28.0)
Bicarbonate: 14.8 mmol/L — ABNORMAL LOW (ref 20.0–28.0)
Drawn by: 51425
Drawn by: 308601
FIO2: 100
FIO2: 40
LHR: 14 {breaths}/min
MECHVT: 530 mL
O2 SAT: 99 %
O2 Saturation: 97.7 %
PATIENT TEMPERATURE: 98.4
PCO2 ART: 57.3 mmHg — AB (ref 32.0–48.0)
PCO2 ART: 30.1 mmHg — AB (ref 32.0–48.0)
PEEP: 5 cmH2O
PO2 ART: 382 mmHg — AB (ref 83.0–108.0)
PO2 ART: 113 mmHg — AB (ref 83.0–108.0)
Patient temperature: 98.6
pH, Arterial: 7.094 — CL (ref 7.350–7.450)
pH, Arterial: 7.313 — ABNORMAL LOW (ref 7.350–7.450)

## 2018-03-17 LAB — APTT
APTT: 33 s (ref 24–36)
aPTT: 34 seconds (ref 24–36)

## 2018-03-17 LAB — GLUCOSE, CAPILLARY
GLUCOSE-CAPILLARY: 201 mg/dL — AB (ref 65–99)
GLUCOSE-CAPILLARY: 76 mg/dL (ref 65–99)
GLUCOSE-CAPILLARY: 100 mg/dL — AB (ref 65–99)
GLUCOSE-CAPILLARY: 121 mg/dL — AB (ref 65–99)
Glucose-Capillary: 180 mg/dL — ABNORMAL HIGH (ref 65–99)
Glucose-Capillary: 148 mg/dL — ABNORMAL HIGH (ref 65–99)

## 2018-03-17 LAB — PREPARE FRESH FROZEN PLASMA: UNIT DIVISION: 0

## 2018-03-17 LAB — ABO/RH: ABO/RH(D): A POS

## 2018-03-17 LAB — BPAM RBC
Blood Product Expiration Date: 201905192359
UNIT TYPE AND RH: 6200

## 2018-03-17 LAB — HEMOGLOBIN AND HEMATOCRIT, BLOOD
HEMATOCRIT: 19.8 % — AB (ref 39.0–52.0)
HEMOGLOBIN: 6.6 g/dL — AB (ref 13.0–17.0)

## 2018-03-17 LAB — PROCALCITONIN: Procalcitonin: <0.1 ng/mL

## 2018-03-17 LAB — ECHOCARDIOGRAM COMPLETE
Height: 67 in
Weight: 3145.6 [oz_av]

## 2018-03-17 LAB — TROPONIN I
TROPONIN I: 0.29 ng/mL — AB (ref <0.03)
TROPONIN I: 0.24 ng/mL — AB (ref <0.03)
Troponin I: 0.03 ng/mL (ref <0.03)
Troponin I: 0.09 ng/mL (ref <0.03)

## 2018-03-17 LAB — LACTIC ACID, PLASMA
LACTIC ACID, VENOUS: 1 mmol/L (ref 0.5–1.9)
Lactic Acid, Venous: 3.7 mmol/L (ref 0.5–1.9)
Lactic Acid, Venous: 3.4 mmol/L (ref 0.5–1.9)
Lactic Acid, Venous: 2.2 mmol/L (ref 0.5–1.9)
Lactic Acid, Venous: 0.8 mmol/L (ref 0.5–1.9)

## 2018-03-17 LAB — MAGNESIUM: Magnesium: 1.7 mg/dL (ref 1.7–2.4)

## 2018-03-17 LAB — PREPARE RBC (CROSSMATCH)

## 2018-03-17 LAB — MRSA PCR SCREENING: MRSA BY PCR: NEGATIVE

## 2018-03-17 LAB — PHOSPHORUS: PHOSPHORUS: 5.9 mg/dL — AB (ref 2.5–4.6)

## 2018-03-17 LAB — CORTISOL: Cortisol, Plasma: 21.8 ug/dL

## 2018-03-17 MED ORDER — SENNOSIDES 8.8 MG/5ML PO SYRP
5.0000 mL | ORAL_SOLUTION | Freq: Every day | ORAL | Status: DC
  Administered 2018-03-17 – 2018-03-19 (×3): 5 mL
  Filled 2018-03-17 (×4): qty 5

## 2018-03-17 MED ORDER — VITAMIN K1 10 MG/ML IJ SOLN
10.0000 mg | Freq: Once | INTRAVENOUS | Status: AC
  Administered 2018-03-17: 10 mg via INTRAVENOUS
  Filled 2018-03-17: qty 1

## 2018-03-17 MED ORDER — SODIUM CHLORIDE 0.9 % IV SOLN
25.0000 ug/h | INTRAVENOUS | Status: DC
  Administered 2018-03-17: 50 ug/h via INTRAVENOUS
  Filled 2018-03-17 (×2): qty 50

## 2018-03-17 MED ORDER — SODIUM CHLORIDE 0.9 % IV SOLN: INTRAVENOUS | Status: DC

## 2018-03-17 MED ORDER — SODIUM BICARBONATE 8.4 % IV SOLN
INTRAVENOUS | Status: AC
  Filled 2018-03-17: qty 100

## 2018-03-17 MED ORDER — DOCUSATE SODIUM 50 MG/5ML PO LIQD
100.0000 mg | Freq: Two times a day (BID) | ORAL | Status: DC
  Administered 2018-03-17 – 2018-03-19 (×6): 100 mg
  Filled 2018-03-17 (×6): qty 10

## 2018-03-17 MED ORDER — SODIUM CHLORIDE 0.9 % IV SOLN: Freq: Once | INTRAVENOUS | Status: DC

## 2018-03-17 MED ORDER — SODIUM CHLORIDE 0.9 % IV SOLN: 250.0000 mL | INTRAVENOUS | Status: DC | PRN

## 2018-03-17 MED ORDER — FAMOTIDINE IN NACL 20-0.9 MG/50ML-% IV SOLN
20.0000 mg | INTRAVENOUS | Status: DC
  Administered 2018-03-18 – 2018-03-19 (×2): 20 mg via INTRAVENOUS
  Filled 2018-03-17 (×2): qty 50

## 2018-03-17 MED ORDER — HYDROCORTISONE NA SUCCINATE PF 100 MG IJ SOLR
50.0000 mg | Freq: Four times a day (QID) | INTRAMUSCULAR | Status: DC
  Administered 2018-03-17 – 2018-03-19 (×8): 50 mg via INTRAVENOUS
  Filled 2018-03-17 (×8): qty 2

## 2018-03-17 MED ORDER — INSULIN ASPART 100 UNIT/ML ~~LOC~~ SOLN
2.0000 [IU] | SUBCUTANEOUS | Status: DC
  Administered 2018-03-17: 2 [IU] via SUBCUTANEOUS

## 2018-03-17 MED ORDER — MIDAZOLAM HCL 2 MG/2ML IJ SOLN
1.0000 mg | INTRAMUSCULAR | Status: DC | PRN
  Filled 2018-03-17 (×2): qty 2

## 2018-03-17 MED ORDER — CHLORHEXIDINE GLUCONATE 0.12% ORAL RINSE (MEDLINE KIT)
15.0000 mL | Freq: Two times a day (BID) | OROMUCOSAL | Status: DC
  Administered 2018-03-17 – 2018-03-19 (×6): 15 mL via OROMUCOSAL

## 2018-03-17 MED ORDER — FINASTERIDE 5 MG PO TABS
5.0000 mg | ORAL_TABLET | Freq: Every day | ORAL | Status: DC
  Administered 2018-03-18 – 2018-03-19 (×2): 5 mg via ORAL
  Filled 2018-03-17 (×2): qty 1

## 2018-03-17 MED ORDER — SODIUM CHLORIDE 0.9 % IV SOLN: INTRAVENOUS | Status: DC | PRN

## 2018-03-17 MED ORDER — NOREPINEPHRINE BITARTRATE 1 MG/ML IV SOLN
0.0000 ug/min | INTRAVENOUS | Status: DC
  Administered 2018-03-17: 25 ug/min via INTRAVENOUS
  Administered 2018-03-18: 3 ug/min via INTRAVENOUS
  Filled 2018-03-17 (×3): qty 16

## 2018-03-17 MED ORDER — ORAL CARE MOUTH RINSE
15.0000 mL | OROMUCOSAL | Status: DC
  Administered 2018-03-17 – 2018-03-20 (×28): 15 mL via OROMUCOSAL

## 2018-03-17 MED ORDER — PHENYLEPHRINE HCL 10 MG/ML IJ SOLN
0.0000 ug/min | INTRAMUSCULAR | Status: DC
  Filled 2018-03-17: qty 1

## 2018-03-17 MED ORDER — NOREPINEPHRINE 4 MG/250ML-% IV SOLN
0.0000 ug/min | INTRAVENOUS | Status: DC
  Administered 2018-03-17: 20 ug/min via INTRAVENOUS
  Filled 2018-03-17 (×3): qty 250

## 2018-03-17 MED ORDER — PHENYLEPHRINE HCL-NACL 10-0.9 MG/250ML-% IV SOLN
0.0000 ug/min | INTRAVENOUS | Status: DC
  Administered 2018-03-17: 300 ug/min via INTRAVENOUS
  Administered 2018-03-17: 250 ug/min via INTRAVENOUS
  Filled 2018-03-17 (×7): qty 250

## 2018-03-17 MED ORDER — SODIUM CHLORIDE 0.9 % IV SOLN
Freq: Once | INTRAVENOUS | Status: AC
  Administered 2018-03-17: 12:00:00 via INTRAVENOUS

## 2018-03-17 MED ORDER — FENTANYL BOLUS VIA INFUSION
25.0000 ug | INTRAVENOUS | Status: DC | PRN
  Administered 2018-03-17: 25 ug via INTRAVENOUS
  Administered 2018-03-17: 50 ug via INTRAVENOUS
  Administered 2018-03-18: 25 ug via INTRAVENOUS
  Filled 2018-03-17: qty 25

## 2018-03-17 MED ORDER — FENTANYL CITRATE (PF) 100 MCG/2ML IJ SOLN
INTRAMUSCULAR | Status: AC
  Administered 2018-03-17: 50 ug
  Filled 2018-03-17: qty 2

## 2018-03-17 MED ORDER — FENTANYL 2500MCG IN NS 250ML (10MCG/ML) PREMIX INFUSION
25.0000 ug/h | INTRAVENOUS | Status: DC
  Administered 2018-03-17 – 2018-03-20 (×2): 50 ug/h via INTRAVENOUS
  Filled 2018-03-17 (×2): qty 250

## 2018-03-17 MED ORDER — PANTOPRAZOLE SODIUM 40 MG PO PACK
40.0000 mg | PACK | Freq: Every day | ORAL | Status: DC
  Administered 2018-03-17: 40 mg
  Filled 2018-03-17 (×2): qty 20

## 2018-03-17 MED ORDER — FAMOTIDINE IN NACL 20-0.9 MG/50ML-% IV SOLN
20.0000 mg | Freq: Two times a day (BID) | INTRAVENOUS | Status: DC
  Administered 2018-03-17: 20 mg via INTRAVENOUS
  Filled 2018-03-17: qty 50

## 2018-03-17 MED ORDER — FAMOTIDINE IN NACL 20-0.9 MG/50ML-% IV SOLN: 20.0000 mg | INTRAVENOUS | Status: DC

## 2018-03-17 MED ORDER — FENTANYL CITRATE (PF) 100 MCG/2ML IJ SOLN
50.0000 ug | Freq: Once | INTRAMUSCULAR | Status: AC
  Administered 2018-03-17: 50 ug via INTRAVENOUS

## 2018-03-17 MED ORDER — SODIUM CHLORIDE 0.9 % IV SOLN
0.0000 ug/min | INTRAVENOUS | Status: DC
  Administered 2018-03-17: 16 ug/min via INTRAVENOUS
  Filled 2018-03-17: qty 40
  Filled 2018-03-17: qty 4

## 2018-03-17 MED ORDER — INSULIN ASPART 100 UNIT/ML ~~LOC~~ SOLN
2.0000 [IU] | SUBCUTANEOUS | Status: DC
  Administered 2018-03-17 – 2018-03-19 (×7): 2 [IU] via SUBCUTANEOUS
  Administered 2018-03-19 (×2): 4 [IU] via SUBCUTANEOUS
  Administered 2018-03-19: 2 [IU] via SUBCUTANEOUS
  Administered 2018-03-19 – 2018-03-20 (×2): 4 [IU] via SUBCUTANEOUS
  Administered 2018-03-20: 2 [IU] via SUBCUTANEOUS

## 2018-03-17 MED ORDER — SODIUM BICARBONATE 8.4 % IV SOLN
100.0000 meq | Freq: Once | INTRAVENOUS | Status: AC
  Administered 2018-03-17: 100 meq via INTRAVENOUS

## 2018-03-17 MED ORDER — MIDAZOLAM HCL 2 MG/2ML IJ SOLN
1.0000 mg | INTRAMUSCULAR | Status: DC | PRN
  Administered 2018-03-17: 2 mg via INTRAVENOUS
  Administered 2018-03-17: 1 mg via INTRAVENOUS

## 2018-03-17 MED ORDER — STERILE WATER FOR INJECTION IV SOLN
INTRAVENOUS | Status: DC
  Administered 2018-03-17 – 2018-03-19 (×10): via INTRAVENOUS
  Filled 2018-03-17 (×14): qty 850

## 2018-03-17 MED ORDER — "THROMBI-PAD 3""X3"" EX PADS"
1.0000 | MEDICATED_PAD | Freq: Once | CUTANEOUS | Status: DC
  Filled 2018-03-17: qty 1

## 2018-03-17 MED ORDER — DEXMEDETOMIDINE HCL IN NACL 200 MCG/50ML IV SOLN
0.4000 ug/kg/h | INTRAVENOUS | Status: DC
  Administered 2018-03-17: 0.6 ug/kg/h via INTRAVENOUS
  Administered 2018-03-17 (×2): 1 ug/kg/h via INTRAVENOUS
  Administered 2018-03-17: 0.6 ug/kg/h via INTRAVENOUS
  Administered 2018-03-17 (×3): 1 ug/kg/h via INTRAVENOUS
  Administered 2018-03-18 (×2): 0.5 ug/kg/h via INTRAVENOUS
  Administered 2018-03-18 (×4): 1 ug/kg/h via INTRAVENOUS
  Administered 2018-03-19 (×2): 0.5 ug/kg/h via INTRAVENOUS
  Administered 2018-03-19 – 2018-03-20 (×3): 0.4 ug/kg/h via INTRAVENOUS
  Filled 2018-03-17 (×9): qty 50
  Filled 2018-03-17: qty 150
  Filled 2018-03-17: qty 50
  Filled 2018-03-17: qty 100
  Filled 2018-03-17 (×4): qty 50

## 2018-03-17 MED ORDER — MAGNESIUM SULFATE 2 GM/50ML IV SOLN
2.0000 g | Freq: Once | INTRAVENOUS | Status: AC
  Administered 2018-03-17: 2 g via INTRAVENOUS
  Filled 2018-03-17: qty 50

## 2018-03-17 MED ORDER — SODIUM CHLORIDE 0.9 % IV SOLN
Freq: Once | INTRAVENOUS | Status: AC
  Administered 2018-03-17: 21:00:00 via INTRAVENOUS

## 2018-03-17 MED ORDER — MIDAZOLAM HCL 2 MG/2ML IJ SOLN
INTRAMUSCULAR | Status: AC
  Administered 2018-03-17: 2 mg
  Filled 2018-03-17: qty 2

## 2018-03-17 MED FILL — Medication: Qty: 1 | Status: AC

## 2018-03-17 NOTE — Progress Notes (Signed)
  Echocardiogram 2D Echocardiogram has been performed.  Troy [name] 03/17/2018, 3:28 PM

## 2018-03-17 NOTE — Progress Notes (Signed)
Dr. Alyson Ingles paged concerning CBI drainage.

## 2018-03-17 NOTE — Progress Notes (Signed)
CRITICAL VALUE ALERT  Critical Value:  Hbg 6.6  Date & Time Notied:  03/17/18 1845  Provider Notified: Dr. Pearline Cables  Orders Received/Actions taken: 2 units of PRBC'S ordered. Will continue to monitor.

## 2018-03-17 NOTE — Progress Notes (Signed)
EEG completed, results pending. 

## 2018-03-17 NOTE — Progress Notes (Signed)
Patient was on CBI and sitter at bed side.Patient noted started having agonal breathing around 0258 and suddenly O2 sats droping to 82 ,pt's  O2 sats was  continuous monitor on dynamap  . Patient  Became unresponsive. checked for pulse , No pulse on  Palpation.  Called code blue at 0259. CPR was Initiated . Code Blue team arrived and report given. Code Blue conducted as per ACLS protocol. Marland KitchenCBG -180. Patient Intubated by RT.  Dr Venora Maples, Lennette Bihari lead the  code [facility].  Dr Maudie Mercury called patient's wife and gave her updates, patient  Transferred to ICU around 0335.

## 2018-03-17 NOTE — Progress Notes (Signed)
CRITICAL VALUE ALERT  Critical Value:  LA 3.7, Trop 0.03  Date & Time Notied:  04/22 0445  Provider Notified: Dr. Jimmey Ralph  Orders Received/Actions taken: Orders received

## 2018-03-17 NOTE — Progress Notes (Signed)
CRITICAL VALUE ALERT  Critical Value:  Hgb 6.2 & Lactic Acid 3.4  Date & Time Notied:  03-17-18 10:35  Provider Notified: Brook, NP  Orders Received/Actions taken: previous order for blood but awaiting for type and screen to result.

## 2018-03-17 NOTE — Progress Notes (Signed)
CRITICAL VALUE ALERT  Critical Value:  Troponin 0.29 Lactic acid 2.2   Date & Time Notied:  03/17/18 @1500    Provider Notified: Kennieth Rad, NP  Orders Received: No new orders

## 2018-03-17 NOTE — Progress Notes (Signed)
CRITICAL VALUE ALERT  Critical Value:  HGB 6.6  Date & Time Notied:  4/22 0405  Provider Notified: Dr. Emmit Alexanders, MD  Orders Received/Actions taken: 1U PRBCs ordered to transfuse

## 2018-03-17 NOTE — Procedures (Signed)
Arterial Catheter Insertion Procedure Note Troy [name] [id number] 04-05-1941  Procedure: Insertion of Arterial Catheter  Indications: Blood pressure monitoring  Procedure Details Consent: Risks of procedure as well as the alternatives and risks of each were explained to the (patient/caregiver).  Consent for procedure obtained. Time Out: Verified patient identification, verified procedure, site/side was marked, verified correct patient position, special equipment/implants available, medications/allergies/relevent history reviewed, required imaging and test results available.  Performed  Maximum sterile technique was used including antiseptics, cap, gloves, gown, hand hygiene, mask and sheet. Skin prep: Chlorhexidine; local anesthetic administered 20 gauge catheter was inserted into right radial artery using the Seldinger technique. ULTRASOUND GUIDANCE USED: YES Evaluation Blood flow good; BP tracing good. Complications: No apparent complications.  RRT assisted by RRT Troy [name]. No complications were noted. Vitals are stable. RT will continue to monitor.   Troy [name] 03/17/2018

## 2018-03-17 NOTE — Progress Notes (Signed)
Subjective: The patient continues to have gross hematuria requiring CBI. The foley stopped draining tonight and Urology was called to irrigate/replace the foley catheter  Objective: Vital signs in last 24 hours: Temp:  [94.8 F (34.9 C)-98.4 F (36.9 C)] 96.8 F (36 C) (04/22 2045) Pulse Rate:  [77-121] 77 (04/22 2048) Resp:  [0-28] 20 (04/22 2048) BP: (58-176)/(32-161) 137/71 (04/22 2048) SpO2:  [67 %-100 %] 100 % (04/22 2048) Arterial Line BP: (108-145)/(54-77) 133/69 (04/22 2045) FiO2 (%):  [40 %] 40 % (04/22 2048) Weight:  [89.2 kg (196 lb 9.6 oz)] 89.2 kg (196 lb 9.6 oz) (04/22 0350)  Intake/Output from previous day: 04/21 0701 - 04/22 0700 In: 76160.7 [P.O.:627; I.V.:862.3] Out: 72028 [Urine:72028] Intake/Output this shift: Total I/O In: 30 [Blood:30] Out: 1200 [Urine:1200]  Physical Exam:  General:intubated, sedated GI: soft, bladder palpable Male genitalia: no penile lesions or discharge no testicular masses Extremities: extremities normal, atraumatic, no cyanosis or edema  Lab Results: Recent Labs    03/17/18 0500 03/17/18 0952 03/17/18 1550  HGB 6.5* 6.2* 6.6*  HCT 19.2* 18.6* 19.8*   BMET Recent Labs    03/17/18 0952 03/17/18 1550  NA 142 144  K 4.4 4.1  CL 114* 112*  CO2 20* 22  GLUCOSE 161* 107*  BUN 55* 56*  CREATININE 4.52* 4.64*  CALCIUM 6.9* 6.9*   Recent Labs    03/17/18 0336 03/17/18 0520 03/17/18 0952  INR 3.12 3.02 2.53   No results for input(s): LABURIN in the last 72 hours. Results for orders placed or performed during the hospital encounter of 03/08/2018  Culture, Urine     Status: None   Collection Time: 03/15/18 11:47 AM  Result Value Ref Range Status   Specimen Description   Final    URINE, CLEAN CATCH Performed at [facility], [place] [address], [place], [place] [address]    Special Requests   Final    NONE Performed at [facility], [place] [address], [place], [place] [address]     Culture   Final    NO GROWTH Performed at [facility], [place] [address], [place], [place] [address]    Report Status 03/16/2018 FINAL  Final  MRSA PCR Screening     Status: None   Collection Time: 03/17/18  4:09 AM  Result Value Ref Range Status   MRSA by PCR NEGATIVE NEGATIVE Final    Comment:        The GeneXpert MRSA Assay (FDA approved for NASAL specimens only), is one component of a comprehensive MRSA colonization surveillance program. It is not intended to diagnose MRSA infection nor to guide or monitor treatment for MRSA infections. Performed at [facility], [place] [address], [place], [place] [address]   Culture, respiratory (NON-Expectorated)     Status: None (Preliminary result)   Collection Time: 03/17/18  4:50 AM  Result Value Ref Range Status   Specimen Description   Final    TRACHEAL ASPIRATE Performed at [place] [address], [place], [place] [address]    Special Requests   Final    NONE Performed at [facility], [place] [address], [place], [place] [address]    Gram Stain   Final    FEW WBC PRESENT, PREDOMINANTLY PMN FEW GRAM NEGATIVE RODS FEW GRAM NEGATIVE COCCOBACILLI RARE GRAM POSITIVE COCCI IN PAIRS Performed at [facility], [place] [address], [place], [place] [address]    Culture PENDING  Incomplete  Report Status PENDING  Incomplete    Studies/Results: Dg Abd 1 View  Result Date: 03/17/2018 CLINICAL DATA:  Orogastric tube placement. EXAM: ABDOMEN - 1 VIEW COMPARISON:  CT of the abdomen and pelvis performed 11/22/2015 FINDINGS: The patient's enteric tube is noted ending overlying the antrum of the stomach. The visualized bowel gas pattern is grossly unremarkable. No free intra-abdominal air is seen, though evaluation for free air is limited on a single supine view. External pacing pads are noted. Patient is status post median sternotomy. No acute osseous abnormalities are  identified. IMPRESSION: Enteric tube noted ending overlying the antrum of the stomach. Electronically Signed   By: Garald Balding M.D.   On: 03/17/2018 06:16   Ct Head Wo Contrast  Result Date: 03/17/2018 CLINICAL DATA:  Status post cardiac arrest. EXAM: CT HEAD WITHOUT CONTRAST TECHNIQUE: Contiguous axial images were obtained from the base of the skull through the vertex without intravenous contrast. COMPARISON:  03/04/2018 FINDINGS: Brain: Subtle loss of gray-white differentiation is questioned in the right basal ganglia. There is mild hypoattenuation of the thalami which is similar to the prior study, and the left basal ganglia do not appear significantly changed. Cortical gray-white differentiation is similar to the prior study, and there is no sulcal or ventricular effacement. There is no evidence of intracranial hemorrhage, mass, midline shift, or extra-axial fluid collection. Moderately advanced cerebral atrophy is again noted with asymmetric ex vacuo enlargement of the atrium and occipital horn of the left lateral ventricle. Periventricular white matter hypodensities are unchanged and nonspecific but compatible with mild-to-moderate chronic small vessel ischemic disease. Vascular: Calcified atherosclerosis at the skull base. Vertebrobasilar dolichoectasia. No hyperdense vessel. Skull: No fracture or focal osseous lesion. Sinuses/Orbits: Mild mucosal thickening in the ethmoid and sphenoid sinuses. Small fluid levels in the sphenoid sinuses. Clear mastoid air cells. Bilateral cataract extraction. Other: None. IMPRESSION: 1. Possible subtle loss of gray-white differentiation in the right basal ganglia which could reflect early changes of hypoxic ischemic encephalopathy. 2. No intracranial hemorrhage. 3. Chronic small vessel ischemic disease and advanced cerebral atrophy. Electronically Signed   By: Logan Bores M.D.   On: 03/17/2018 08:45   US Renal  Result Date: 03/17/2018 CLINICAL DATA:   Hydronephrosis. Supratherapeutic INR with hematuria and ongoing bladder irrigation. Possible bladder mass. EXAM: RENAL / URINARY TRACT ULTRASOUND COMPLETE COMPARISON:  Three days ago FINDINGS: Right Kidney: Length: 14 cm.  Moderate hydronephrosis without visible debris. Left Kidney: Length: 14 cm. Moderate hydronephrosis without visible debris. 2 cm upper pole cyst. Bladder: New luminal heterogeneous avascular mass about the Foley catheter balloon with elsewhere distended urinary bladder. This obscures the area concerning for bladder base mass on prior ultrasound. IMPRESSION: 1. Blood clot completely encompasses the Foley catheter and there is bladder distention with bilateral hydronephrosis. 2. Blood clot obscures the area concerning for bladder base mass on prior scan. Electronically Signed   By: Monte Fantasia M.D.   On: 03/17/2018 12:02   Dg Chest Port 1 View  Result Date: 03/17/2018 CLINICAL DATA:  Central line placement. EXAM: PORTABLE CHEST 1 VIEW COMPARISON:  Chest radiograph performed earlier today at 3:14 a.m. FINDINGS: The patient's left IJ line is noted ending about the proximal SVC. The patient's endotracheal tube is seen ending 4-5 cm above the carina. An enteric tube is noted extending below the diaphragm. Mild left basilar atelectasis is noted. No pleural effusion or pneumothorax is seen. The cardiomediastinal silhouette is mildly enlarged. The patient is status post median sternotomy. No acute osseous abnormalities  are identified. IMPRESSION: 1. Left IJ line noted ending about the proximal SVC. 2. Endotracheal tube seen ending 4-5 cm above the carina. 3. Mild left basilar atelectasis. 4. Mild cardiomegaly. Electronically Signed   By: Garald Balding M.D.   On: 03/17/2018 06:13   Dg Chest Port 1 View  Result Date: 03/17/2018 CLINICAL DATA:  ETT placement EXAM: PORTABLE CHEST 1 VIEW COMPARISON:  01/19/2017 FINDINGS: Endotracheal tube tip is about 5.6 cm superior to the carina. Post sternotomy  changes. Mild cardiomegaly with aortic atherosclerosis. No acute consolidation or effusion. No pneumothorax. IMPRESSION: 1. Endotracheal tube tip about 5.6 cm superior to the carina 2. Mild cardiomegaly without edema or infiltrate Electronically Signed   By: Donavan Foil M.D.   On: 03/17/2018 03:35    Assessment/Plan: 77yo with gross hematuria  1. Foley catheter replaced with a 24 french hematuria catheter. 300cc of clot was irrigated from the bladder and the urine was clear at the end of irrigating. CBI was restarted. Finasteride 5mg  ordered.    LOS: 3 days   Troy [name] 03/17/2018, 10:12 PM

## 2018-03-17 NOTE — Procedures (Signed)
ELECTROENCEPHALOGRAM REPORT  Date of Study: 03/17/2018  Patient's Name: Troy [name] MRN: [id number] Date of Birth: 1941-11-04  Referring Provider: Vernie Murders, MD  Clinical History: 77 year old man with acute kidney insufficiency status post cardiac arrest.  Medications: Precedex Fentanyl  Technical Summary: A multichannel digital EEG recording measured by the international 10-20 system with electrodes applied with paste and impedances below 5000 ohms performed as portable with EKG monitoring in an unresponsive patient on precedex and fentanyl and undergoing hypothermia protocol.  Hyperventilation and photic stimulation were not performed.  The digital EEG was referentially recorded, reformatted, and digitally filtered in a variety of bipolar and referential montages for optimal display.   Description: The patient is unresponsive on precedex and fentanyl during the recording.  The study is limited due to diffuse muscle artifact.  There is diffuse 2-3 Hz delta and 4-5 Hz theta slowing of the background with a posterior dominant rhythm of 4 Hz.  Stage 2 sleep is not seen.  There were no epileptiform discharges or electrographic seizures seen.    EKG lead was unremarkable.  Impression: This EEG is abnormal due to diffuse slowing of the waking background.  Clinical Correlation of the above findings indicates diffuse cerebral dysfunction that is non-specific in etiology and can be seen with hypoxic/ischemic injury, toxic/metabolic encephalopathies, neurodegenerative disorders, or medication effect.    Metta Clines, DO

## 2018-03-17 NOTE — Progress Notes (Signed)
ABG ordered at 0634 to be done at [facility] upon pt arrival to [facility] per MD.

## 2018-03-17 NOTE — Progress Notes (Signed)
Dr. Jimmey Ralph at bedside and verified xray for CVC placement. Per Dr. Jimmey Ralph, CVC is ready for use. Will continue to monitor.

## 2018-03-17 NOTE — Progress Notes (Signed)
RT went to CT with ventilator to meet patient being transported by EMS. RT transported patient from CT to room 7S82 with no complications. Vitals are stable and sats are 100%. RT will continue to monitor.

## 2018-03-17 NOTE — Consult Note (Signed)
PULMONARY / CRITICAL CARE MEDICINE   Name: Troy [name] MRN: [id number] DOB: 11-29-1940    ADMISSION DATE:  03/10/2018 CONSULTATION DATE: 03/17/18  REFERRING MD: Dr Maudie Mercury  CHIEF COMPLAINT: Cardiac arrest  HISTORY OF PRESENT ILLNESS:   75yoM with hx Afib (on coumadin), OSA, Parkinsons, HTN, HLD, Hepatic steatosis, Gout, DM, Dementia, CAD, and AAA, admitted 03/13/2018 with hematuria and AKI. Creatinine was 3.01 on admission, up from baseline of 0.7-1.0. He was also found at that time to have hematuria, for which Urology was consulted and started continuous bladder irrigation. Hgb on admission was 12.7, from baseline of 13-14. During admission, it continued to drop to a low of 7.7 on 4/21 PM. He is on chronic coumadin for his Afib, and on admission, his INR was 2.89, then climbing to 3.01, now 3.12. It does not appear that his INR was reversed during this admission, despite the hematuria. But he was not given any Coumadin either. For his AKI, he was given 500cc IVF bolus on 4/19 and another 1L IVF bolus on 4/21. He was continued on NS @ 50cc/hr for several days. Patient was also started on bactrim (reason being "due to extensive catheter manipulation") on 4/21, which also likely contributed to his further renal decline. On 4/20 PM and again on 4/21 PM patient refused his CPAP. Sitter was at bedside as patient was confused. At 3:24am the sitter noted that patient had agonal breathing. He was found to be pulseless with a PEA rhythm. CPR was started with ROSC attained after 5 minutes. Post resuscitation, patient was making nonpurposeful movments of arms, with some myoclonic jerks but was not obeying any commands.   PAST MEDICAL HISTORY :  He  has a past medical history of AAA (abdominal aortic aneurysm) (Troy [name]), Chronic anticoagulation, CKD (chronic kidney disease) stage 2, GFR 60-89 ml/min, Constipation, Coronary artery disease (07/2009), Dementia, Diabetes mellitus, Diverticulosis, Edema extremities  (09/03/2017), Facial nerve spasm, Fecal impaction (Troy [name]), Gout, Hepatic steatosis, Hyperlipidemia, Hypertension, Hypothyroidism, OSA (obstructive sleep apnea), Parkinsonism (Troy [name]), and Persistent atrial fibrillation (Troy [name]).  PAST SURGICAL HISTORY: He  has a past surgical history that includes Coronary artery bypass graft (07-2009); Spine surgery (1998); Eye surgery (Left, May  2013); Eye surgery (Right, June 2013); and Abdominal aortic aneurysm repair (01-2010).  Allergies  Allergen Reactions  . Bee Venom Anaphylaxis  . Other Anaphylaxis    Other reaction(s): Laryngeal Edema (ALLERGY) "a heart medication" ? unknown  . Tamsulosin Other (See Comments)    Insomnia  . Penicillins Rash    Has patient had a PCN reaction causing immediate rash, facial/tongue/throat swelling, SOB or lightheadedness with hypotension: Y Has patient had a PCN reaction causing severe rash involving mucus membranes or skin necrosis: Y Has patient had a PCN reaction that required hospitalization: N Has patient had a PCN reaction occurring within the last 10 years: N If all of the above answers are "NO", then may proceed with Cephalosporin use.   . Tetanus Toxoids Rash    No current facility-administered medications on file prior to encounter.    Current Outpatient Medications on File Prior to Encounter  Medication Sig  . amLODipine (NORVASC) 10 MG tablet TAKE ONE TABLET BY MOUTH ONCE DAILY  . carbidopa-levodopa (SINEMET CR) 50-200 MG tablet Take 1 tablet by mouth 4 (four) times daily. 7, 11, 3 pm and 7 pm  . carboxymethylcellulose (REFRESH PLUS) 0.5 % SOLN Place 1 drop into both eyes 3 (three) times daily as needed (dry eyes).  . Cholecalciferol (VITAMIN D-3) 1000  UNITS CAPS Take 2,000 Units by mouth daily.   . Cinnamon 500 MG capsule Take 1,000 mg by mouth daily.   . clonazePAM (KLONOPIN) 0.25 MG disintegrating tablet DISSOLVE 2 TABLETS IN MOUTH AT BEDTIME  . DHA-EPA-Vitamin E 258-527-7 MG-MG-UNIT CAPS Take 2 capsules  by mouth daily.   Marland Kitchen donepezil (ARICEPT) 10 MG tablet Take 1 tablet (10 mg total) by mouth daily with breakfast.  . fluticasone (FLONASE) 50 MCG/ACT nasal spray Place 2 sprays into the nose daily as needed for allergies.   Marland Kitchen levothyroxine (SYNTHROID, LEVOTHROID) 125 MCG tablet Take 125 mcg by mouth daily.    Marland Kitchen lisinopril (PRINIVIL,ZESTRIL) 40 MG tablet TAKE 1 TABLET BY MOUTH ONCE DAILY  . memantine (NAMENDA) 10 MG tablet Take 1 tablet (10 mg total) by mouth 2 (two) times daily.  Marland Kitchen OVER THE COUNTER MEDICATION Take 1 tablet by mouth 2 (two) times daily. Macular degeneration supplement  . OVER THE COUNTER MEDICATION Take 1 capsule by mouth daily. Homocysteine supplement  . rosuvastatin (CRESTOR) 5 MG tablet TAKE 1 TABLET BY MOUTH ONCE DAILY  . warfarin (COUMADIN) 5 MG tablet TAKE AS DIRECTED BY COUMADIN CLINIC (Patient taking differently: Take 5-7.5 mg by mouth See admin instructions. Take 1 tablet (5 mg) by mouth daily except on Tuesdays Take 1.5 tablets (7.5 mg).)  . EPIPEN 2-PAK 0.3 MG/0.3ML SOAJ Inject 0.3 mg into the muscle once as needed. Allergic reaction  . guaiFENesin (MUCINEX) 600 MG 12 hr tablet Take 2 tablets (1,200 mg total) by mouth 2 (two) times daily. (Patient not taking: Reported on 03/24/2018)  . tamsulosin (FLOMAX) 0.4 MG CAPS capsule Take 0.4 mg by mouth daily.    FAMILY HISTORY:  His indicated that his mother is deceased. He indicated that his father is deceased. He indicated that his brother is alive. He indicated that the status of his paternal uncle is unknown.  SOCIAL HISTORY: He  reports that he quit smoking about 9 years ago. His smoking use included cigarettes. He quit after 15.00 years of use. He has never used smokeless tobacco. He reports that he drinks about 1.2 - 1.8 oz of alcohol per week. He reports that he does not use drugs.  REVIEW OF SYSTEMS:   Review of Systems  Unable to perform ROS: Critical illness   SUBJECTIVE:  Intubated and sedated  VITAL SIGNS: BP  (!) 175/87   Pulse (!) 107   Temp 98 F (36.7 C) (Oral)   Resp 18   Ht 5\' 7"  (1.702 m)   Wt 89.2 kg (196 lb 9.6 oz)   SpO2 100%   BMI 30.79 kg/m   HEMODYNAMICS:  Levophed @ 61mcg, Neo @ 443mcg  VENTILATOR SETTINGS: Vent Mode: PRVC FiO2 (%):  [40 %] 40 % Set Rate:  [14 bmp-20 bmp] 20 bmp Vt Set:  [530 mL] 530 mL PEEP:  [5 cmH20] 5 cmH20 Plateau Pressure:  [14 cmH20] 14 cmH20  INTAKE / OUTPUT: I/O last 3 completed shifts: In: 25777 [P.O.:1077; I.V.:1000; Other:23700] Out: 42400 [Urine:42400]  PHYSICAL EXAMINATION: General: Elderly male, intubated, unresponsive, critically ill Neuro: prior to any sedation: eyes open staring straight ahead. Pupils poinpoint and sluggish bilaterally. No response to touch or sternal rub. Not obeying commands. Having some myoclonic jerks in legs. Some nonpurposeful movements of b/l hands HEENT: OP clear, MM moist, Orally intubated Cardiovascular: Tachycardic irreg irreg Lungs: CTA b/l Abdomen: Soft NTND, BS hypoactive Musculoskeletal: no LE edema GU: 3-way foley with continuous irrigation; urine is blood tinged but not frankly bloody  Skin: no rashes   LABS:  BMET Recent Labs  Lab 03/15/18 0405 03/16/18 0442 03/17/18 0336  NA 148* 147* 144  K 3.7 3.9 4.1  CL 114* 114* 117*  CO2 23 22 15*  BUN 47* 52* 61*  CREATININE 2.72* 3.30* 4.46*  GLUCOSE 99 128* 211*   Electrolytes Recent Labs  Lab 03/15/18 0405 03/16/18 0442 03/17/18 0336  CALCIUM 8.9 8.8* 7.6*   CBC Recent Labs  Lab 03/15/18 0405 03/16/18 0442 03/16/18 1811 03/17/18 0336  WBC 6.9 9.9  --  13.8*  HGB 11.5* 9.7* 7.7* 6.6*  HCT 34.8* 28.8* 23.5* 19.9*  PLT 123* 123*  --  114*   Coag's Recent Labs  Lab 03/15/18 0405 03/16/18 0442 03/17/18 0336  INR 3.01 2.85 3.12   Sepsis Markers Recent Labs  Lab 03/17/18 0336  LATICACIDVEN 3.7*   ABG Recent Labs  Lab 03/17/18 0311 03/17/18 0428  PHART 7.094* 7.313*  PCO2ART 57.3* 30.1*  PO2ART 382* 113*    Liver Enzymes Recent Labs  Lab 03/01/2018 1145 03/17/18 0336  AST 22 23  ALT 6* 6*  ALKPHOS 56 35*  BILITOT 0.7 0.4  ALBUMIN 4.0 2.5*   Cardiac Enzymes Recent Labs  Lab 03/17/18 0336  TROPONINI 0.03*   Glucose Recent Labs  Lab 03/15/18 1026 03/16/18 0821 03/17/18 0307 03/17/18 0415  GLUCAP 98 151* 180* 201*   Imaging Dg Chest Port 1 View  Result Date: 03/17/2018 CLINICAL DATA:  ETT placement EXAM: PORTABLE CHEST 1 VIEW COMPARISON:  01/19/2017 FINDINGS: Endotracheal tube tip is about 5.6 cm superior to the carina. Post sternotomy changes. Mild cardiomegaly with aortic atherosclerosis. No acute consolidation or effusion. No pneumothorax. IMPRESSION: 1. Endotracheal tube tip about 5.6 cm superior to the carina 2. Mild cardiomegaly without edema or infiltrate Electronically Signed   By: Donavan Foil M.D.   On: 03/17/2018 03:35   STUDIES:  Head CT (03/02/2018): Chronic atrophic and ischemic changes without acute abnormality.  Renal Ultrasound (03/05/2018): Moderate bilateral hydronephrosis is likely due to a mass lesion in the urinary bladder. This could be a primary bladder tumor or could be related to the prostate gland. The prostate is difficult to distinguish from the lesion.  CXR (03/17/18): 1. Endotracheal tube tip about 5.6 cm superior to the carina. 2. Mild cardiomegaly without edema or infiltrate   CULTURES: Urine culture (4/20): no growth Sputum culture (4/22): pending Blood cultures (4/22): ordered  ANTIBIOTICS: Bactrim 4/21 (now stopped)  SIGNIFICANT EVENTS: 4/19: admitted to [facility] with AKI and Hematuria 4/22 AM: PEA arrest due to acidosis (combination metabolic and respiratory), transferring to [facility] ICU for cooling  LINES/TUBES: LIJ TLC 03/17/18 >> 3-way Foley 03/15/18 >> OG tube 03/17/18 >> PIV's  DISCUSSION: 77yoM with hx Afib (on coumadin), OSA, Parkinsons, HTN, HLD, Hepatic steatosis, Gout, DM, Dementia, CAD, and AAA, admitted 02/24/2018 with hematuria and AKI,  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 Patient's AKI continued to worsen. He continued to have hematuria in setting of supratherapeutic INR, and early this AM developed cardiac arrest, now s/p ROSC but not and neurologic baseline so starting therapeutic hypothermia.   ASSESSMENT / PLAN:  PULMONARY 1. Acute Hypoxic and Hypercapneic Respiratory failure; OSA - ABG immediately post intubation showed severe acidosis that was a combination metabolic and respiratory - Increased RR from 14 to 20 on vent; gave 2 amps Bicarb IV push and started Bicarb gtt - CXR on my review showed mild pulmonary edema, no infiltrates;  ETT in good position.  - has hx of OSA but is noncompliant with CPAP; was also receiving Norco prior to the cardiac arrest, which likely contributed to the hypercapnea.  CARDIOVASCULAR 1. Cardiac arrest  - unclear how long patient was down, as his sitter "found him to be having agonal respirations" and patient was not on a telemetry floor. Rapid response RN reports when she arrived at the code, she was told it was PEA but that it appeared Asystole to her. ROSC was attained within 5 minutes of starting CPR.  - TTE ordered.  - Arrest though due to severe acidosis. No ST changes on EKG. Troponin only 0.03  2. Shock: certainly has a component of hypovolemic shock; unclear if cardiogenic or septic shock as well - s/p 2L IVF bolus already in; give another 500cc IVF bolus now to complete his 30cc/kg - check cortisol level - TTE ordered - Central line placed; Currently on Levophed and Neo gtt. Will add Hydrocortisone.  - Check CVP  3. Afib, CAD - continue holding coumadin; INR supratherapeutic (addressed below); HR currently in 110's believed due to volume depletion and will improve with IVF's.   4. Hx HTN; Currently in Shock: - hold home antihypertensives  RENAL 1. AKI; Metabolic acidosis: - creatinine 3.01  on admission, up from baseline of 0.7-1.0. Creatinine now worsened to 4.46.  - he received a total of 1.5L IVF bolus since admission on 4/19 and was maintained on NS @ 50cc/hr.  - since ROSC, he has received 2L IVF bolus - severe acidosis, that is partially respiratory and partially metabolic (from combination of AKI and lactic acidosis); adjusted vent as discussed above. Gave 2 amps Bicarb IV and starting Bicarb gtt.  - Renal ultrasound on admission (prior to foley being placed) showed hydronephrosis along with the bladder tumor. Had had good UOP per RN (however is having continuous bladder irrigation so unclear how much of it is urine). Will place order to repeat renal ultrasound now to see if hydronephrosis has resolved of if bladder tumor is continuing to cause hydronephrosis, in which case may need nephrostomy tubes - Will need Renal Consult.   GASTROINTESTINAL No active issues; NPO; GI prophylaxis  HEMATOLOGIC 1. Anemia; Hematuria; Supratherapeutic INR; Bladder tumor - Hgb 12.7 on admission, from baseline of 13-14. During admission, it continued to drop to a low of 7.7 on 4/21 PM. He is on chronic coumadin for his Afib, and on admission, his INR was 2.89, then climbing to 3.01, now 3.12. Coumadin was held this admission, but no attempt to revers his INR was made, despite the hematuria.  - Currently Hgb 6.6 and INR 3.1 post arrest; will transfuse 1u pRBC, 1u FFP, and give 10u Vit K. Repeat Hgb and INR post transfusion - is still having some hematuria in foley but not frank blood.  - Urology following for bladder tumor.   INFECTIOUS 1. Lactic Acidosis:  - feel most likely due to dehydration and poor renal clearance of the lactate in setting of AKI. No convincing signs of infection. CXR shows no pneumonia. UA on admission showed no UTI. Obtain new UA/Urine culture and Blood cultures now. Check procalcitonin. - lactate 3.7 (before IVF's given); continue to trend.   ENDOCRINE 1. DM: - NPO;  SSI  NEUROLOGIC 1. Anoxic Encephalopathy; hx Parkinsons disease with Dementia: - starting therapeutic hypothermia given that he is not at his neurologic baseline post arrest - Head CT and EEG ordered.   FAMILY  - Updates: updated patient's wife  at bedside in ICU - Inter-disciplinary family meet or Palliative Care meeting due by: 03/23/18  60 minutes nonprocedural critical care time  Vernie Murders, MD  Pulmonary and [place] Pager: [phone]  03/17/2018, 5:30 AM

## 2018-03-17 NOTE — Progress Notes (Signed)
eLink Physician-Brief Progress Note Patient Name: FAYE SANFILIPPO DOB: 07/11/1941 MRN: [id number]   Date of Service  03/17/2018  HPI/Events of Note  77 yo male recent hospitalization for hematuria undergoing continuous bladder irrigation. Report of increasing SOB and hypoxia followed by arrest. On my arrival the bedside the patient is undergoing CPR. Epi x 2. stadard ACLS. Bagging occurring. Apparent organized rhythm on monitor at this time. Now intubated, ventilated and moved to ICU. BP = 108/83 and HR = 84. Sat = 97%. PCCM consulted to assume management. ETT 5.6 cm above the carina. Hgb = 6.6.   eICU Interventions  Will order: 1. Advance ETT 2 cm.  2. Ventilator orders: 40%/PRVC 14/TV 530/P 5. 3. ABG at 4:30 AM. 4. Fentanyl IV infusion. Titrate to RASS = 0 to -1. 5. Cycle Troponin. 6. Transfuse 1 unit PRBC.  7. Phenylephrine IV infusion. Titrate to MAP >= 65.  8. Pepcid 20 mg IV BID. 9.SCD's.     Intervention Category Evaluation Type: New Patient Evaluation  Lysle Dingwall 03/17/2018, 4:00 AM

## 2018-03-17 NOTE — Progress Notes (Signed)
Xcover See code note from Dr. Venora Maples Pt will be moved to ICU Pt will be seen by PCCM I updated wife

## 2018-03-17 NOTE — Consult Note (Addendum)
Department of Emergency Medicine   Code Blue CONSULT NOTE  Chief Complaint: Cardiac arrest/unresponsive   Level V Caveat: Unresponsive  History of present illness: I was contacted by the hospital for a CODE BLUE cardiac arrest upstairs and presented to the patient's bedside.   Recent hospitalization for hematuria undergoing continuous bladder irrigation. Report of increasing SOB and hypoxia followed by arrest. On my arrival the bedside the patient is undergoing CPR. Epi x 2. stadard ACLS. Bagging occurring. Apparent organized rhythm on monitor at this time  ROS: Unable to obtain, Level V caveat  Scheduled Meds: . carbidopa-levodopa  1 tablet Oral 4 times per day  . cholecalciferol  2,000 Units Oral Daily  . clonazePAM  0.25 mg Oral QHS  . donepezil  10 mg Oral Q breakfast  . levothyroxine  125 mcg Oral QAC breakfast  . memantine  10 mg Oral BID  . omega-3 acid ethyl esters  1 g Oral Daily  . rosuvastatin  5 mg Oral QHS  . senna  1 tablet Oral BID  . sulfamethoxazole-trimethoprim  1 tablet Oral Q12H  . tamsulosin  0.4 mg Oral Daily   Continuous Infusions: . sodium chloride 50 mL/hr at 03/16/18 1923   PRN Meds:.acetaminophen **OR** acetaminophen, fluticasone, HYDROcodone-acetaminophen, ondansetron **OR** ondansetron (ZOFRAN) IV, polyethylene glycol, polyvinyl alcohol Past Medical History:  Diagnosis Date  . AAA (abdominal aortic aneurysm) ([place])    with mural thrombus  . Chronic anticoagulation   . CKD (chronic kidney disease) stage 2, GFR 60-89 ml/min   . Constipation   . Coronary artery disease 07/2009   severe 3 vessel ASCAD s/p CABG with LIMA to LAD, SVG to diag, SVG to PL  . Dementia   . Diabetes mellitus   . Diverticulosis   . Edema extremities 09/03/2017  . Facial nerve spasm    L eye, tx'd with botox  . Fecal impaction ([place])   . Gout   . Hepatic steatosis   . Hyperlipidemia   . Hypertension   . Hypothyroidism   . OSA (obstructive sleep apnea)    intolerant to  CPAP followed by Dr. Lamonte Sakai  . Parkinsonism ([place])   . Persistent atrial fibrillation [facility])    Past Surgical History:  Procedure Laterality Date  . ABDOMINAL AORTIC ANEURYSM REPAIR  01-2010   aorto-bi-iliac Gore Excluder stent graft  . CORONARY ARTERY BYPASS GRAFT  07-2009  . EYE SURGERY Left May  2013   Cataract  . EYE SURGERY Right June 2013   Cataract  . [place]   Social History   Socioeconomic History  . Marital status: Married    Spouse name: Not on file  . Number of children: 3  . Years of education: BS  . Highest education level: Not on file  Occupational History  . Occupation: retired  Scientific laboratory technician  . Financial resource strain: Not on file  . Food insecurity:    Worry: Not on file    Inability: Not on file  . Transportation needs:    Medical: Not on file    Non-medical: Not on file  Tobacco Use  . Smoking status: Former Smoker    Years: 15.00    Types: Cigarettes    Last attempt to quit: 10/07/2008    Years since quitting: 9.4  . Smokeless tobacco: Never Used  Substance and Sexual Activity  . Alcohol use: Yes    Alcohol/week: 1.2 - 1.8 oz    Types: 2 - 3 Glasses of wine per week  Comment: A week  . Drug use: No  . Sexual activity: Never  Lifestyle  . Physical activity:    Days per week: Not on file    Minutes per session: Not on file  . Stress: Not on file  Relationships  . Social connections:    Talks on phone: Not on file    Gets together: Not on file    Attends religious service: Not on file    Active member of club or organization: Not on file    Attends meetings of clubs or organizations: Not on file    Relationship status: Not on file  . Intimate partner violence:    Fear of current or ex partner: Not on file    Emotionally abused: Not on file    Physically abused: Not on file    Forced sexual activity: Not on file  Other Topics Concern  . Not on file  Social History Narrative   Patient lives at home with his wife   Patient  is right handed   Patient drinks coffee   Allergies  Allergen Reactions  . Bee Venom Anaphylaxis  . Other Anaphylaxis    Other reaction(s): Laryngeal Edema (ALLERGY) "a heart medication" ? unknown  . Tamsulosin Other (See Comments)    Insomnia  . Penicillins Rash    Has patient had a PCN reaction causing immediate rash, facial/tongue/throat swelling, SOB or lightheadedness with hypotension: Y Has patient had a PCN reaction causing severe rash involving mucus membranes or skin necrosis: Y Has patient had a PCN reaction that required hospitalization: N Has patient had a PCN reaction occurring within the last 10 years: N If all of the above answers are "NO", then may proceed with Cephalosporin use.   . Tetanus Toxoids Rash    Last set of Vital Signs (not current) Vitals:   03/16/18 2007 03/17/18 0008  BP: (!) 88/57 97/74  Pulse:  83  Resp:  16  Temp:  97.7 F (36.5 C)  SpO2:  98%      Physical Exam Gen: unresponsive Cardiovascular: pulseless  Resp: apneic. Breath sounds equal bilaterally with bagging  Abd: nondistended  Neuro: GCS 3, unresponsive to pain  HEENT: No blood in posterior pharynx, gag reflex absent  Neck: No crepitus  Musculoskeletal: No deformity  Skin: warm GU: foley in place. Thin hematuria in bag    ECG interpretation  Date: 03/17/2018  Rate: 116  Rhythm: afib with RVR QRS Axis: normal  Intervals: normal  ST/T Wave abnormalities: normal  Conduction Disutrbances: none  Narrative Interpretation:   Old EKG Reviewed: No significant changes noted except for rate     Procedures  INTUBATION Performed by CRNA under my supervision Required items: required blood products, implants, devices, and special equipment available Patient identity confirmed: provided demographic data and hospital-assigned identification number Time out: Immediately prior to procedure a "time out" was called to verify the correct patient, procedure, equipment, support staff  and site/side marked as required. Indications: respiratory failure Intubation method: Miller blade prreoxygenation: BVM Sedatives: Etomidate Paralytic: Succinylcholine Tube Size: 7.5 cuffed Post-procedure assessment: chest rise and ETCO2 monitor Breath sounds: equal and absent over the epigastrium Tube secured with: ETT holder Chest x-ray interpreted by radiologist and me. Chest x-ray findings: endotracheal tube in appropriate position Patient tolerated the procedure well with no immediate complications.     CRITICAL CARE Performed by: Jola Schmidt Total critical care time: 30 Critical care time was exclusive of separately billable procedures and treating other patients. Critical  care was necessary to treat or prevent imminent or life-threatening deterioration. Critical care was time spent personally by me on the following activities: development of treatment plan with patient and/or surrogate as well as nursing, discussions with consultants, evaluation of patient's response to treatment, examination of patient, obtaining history from patient or surrogate, ordering and performing treatments and interventions, ordering and review of laboratory studies, ordering and review of radiographic studies, pulse oximetry and re-evaluation of patient's condition.  Cardiopulmonary Resuscitation (CPR) Procedure Note Directed/Performed by: Jola Schmidt I personally directed ancillary staff and/or performed CPR in an effort to regain return of spontaneous circulation and to maintain cardiac, neuro and systemic perfusion.    Medical Decision making  Apparent primary respiratory arrest. ROSC shortly after my arrival with standard ACLS, epi, CPR, supplemental oxygen  Assessment and Plan  Labs cxr ABG PCCM consultation   D/W Dr Maudie Mercury, [facility], who will take over care at this time  (3:31 AM)

## 2018-03-17 NOTE — Procedures (Signed)
Central Venous Catheter Insertion Procedure Note JUANANGEL SODERHOLM [id number] October 25, 1941  Procedure: Insertion of Central Venous Catheter Indications: Assessment of intravascular volume, Drug and/or fluid administration and Frequent blood sampling  Procedure Details Consent: Unable to obtain consent because of emergent medical necessity. Time Out: Verified patient identification, verified procedure, site/side was marked, verified correct patient position, special equipment/implants available, medications/allergies/relevent history reviewed, required imaging and test results available.  Performed  Maximum sterile technique was used including antiseptics, cap, gloves, gown, hand hygiene, mask and sheet. Skin prep: Chlorhexidine; local anesthetic administered A antimicrobial bonded/coated triple lumen catheter was placed in the left internal jugular vein using the Seldinger technique.  Evaluation Blood flow good Complications: No apparent complications Patient did tolerate procedure well. Chest X-ray ordered to verify placement.  CXR: pending.  Sharyn Blitz Ailie Gage 03/17/2018, 5:27 AM

## 2018-03-17 NOTE — Progress Notes (Signed)
PULMONARY / CRITICAL CARE MEDICINE   Name: ALA CAPRI MRN: [id number] DOB: [birth year]    ADMISSION DATE:  03/13/2018 CONSULTATION DATE: 03/17/18  REFERRING MD: Dr Maudie Mercury  CHIEF COMPLAINT: Cardiac arrest  HISTORY OF PRESENT ILLNESS:   26yoM with hx Afib (on coumadin), OSA, Parkinsons, HTN, HLD, Hepatic steatosis, Gout, DM, Dementia, CAD, and AAA, admitted 02/24/2018 with hematuria and AKI. Creatinine was 3.01 on admission, up from baseline of 0.7-1.0. He was also found at that time to have hematuria, for which Urology was consulted and started continuous bladder irrigation. Hgb on admission was 12.7, from baseline of 13-14. During admission, it continued to drop to a low of 7.7 on 4/21 PM. He is on chronic coumadin for his Afib, and on admission, his INR was 2.89, then climbing to 3.01, now 3.12. It does not appear that his INR was reversed during this admission, despite the hematuria. But he was not given any Coumadin either. For his AKI, he was given 500cc IVF bolus on 4/19 and another 1L IVF bolus on 4/21. He was continued on NS @ 50cc/hr for several days. Patient was also started on bactrim (reason being "due to extensive catheter manipulation") on 4/21, which also likely contributed to his further renal decline. On 4/20 PM and again on 4/21 PM patient refused his CPAP. Sitter was at bedside as patient was confused. At 3:24am the sitter noted that patient had agonal breathing. He was found to be pulseless with a PEA rhythm. CPR was started with ROSC attained after 5 minutes. Post resuscitation, patient was making nonpurposeful movments of arms, with some myoclonic jerks but was not obeying any commands.   SUBJECTIVE:  Transferred from [place] for TTM 36 Currently on fentanyl 350 mcg for vent synchrony (double stacking) and bitting Levophed at 20 mcg/min, neo at 30 mcg/min, bicarb gtt at 150 ml/hr Pending transfusion of PRBC and FFP  VITAL SIGNS: BP (!) 108/97   Pulse (!) 111   Temp (!)  96.8 F (36 C) (Rectal)   Resp 12   Ht 5\' 7"  (1.702 m)   Wt 196 lb 9.6 oz (89.2 kg)   SpO2 100%   BMI 30.79 kg/m   HEMODYNAMICS:    VENTILATOR SETTINGS: Vent Mode: PRVC FiO2 (%):  [40 %] 40 % Set Rate:  [14 bmp-20 bmp] 20 bmp Vt Set:  [530 mL] 530 mL PEEP:  [5 cmH20] 5 cmH20 Plateau Pressure:  [14 cmH20] 14 cmH20  INTAKE / OUTPUT: I/O last 3 completed shifts: In: 72536.6 [P.O.:627; I.V.:862.3; YQIHK:74259] Out: 56387 [FIEPP:29518]  PHYSICAL EXAMINATION: General:  Critically ill elderly male sedated on MV HEENT: MM pink/moist, ETT/ OGT, pupils pin point Neuro: sedated, unresponsive, no spontaneous movement noted CV: IRIR, no m/r/g, 2+ distal pulses PULM: occasional double stacking on MV, lungs clear, diminished in bases GI/GU: soft, bs hypo active, cooling pads in place, 3 way foley with minimal blood tinged Extremities: warm/dry, no edema  Skin: no rashes, bruising to bilateral upper extremites  LABS:  BMET Recent Labs  Lab 03/17/18 0500 03/17/18 0520 03/17/18 0952  NA 148* 146* 142  K 4.4 4.3 4.4  CL 116* 114* 114*  CO2 19* 18* 20*  BUN 60* 61* 55*  CREATININE 4.23* 4.24* 4.52*  GLUCOSE 203* 194* 161*    Electrolytes Recent Labs  Lab 03/17/18 0500 03/17/18 0520 03/17/18 0952  CALCIUM 7.2* 7.1* 6.9*  MG 1.7  --   --   PHOS 5.9*  --   --  CBC Recent Labs  Lab 03/17/18 0336 03/17/18 0500 03/17/18 0952  WBC 13.8* 18.5* 15.6*  HGB 6.6* 6.5* 6.2*  HCT 19.9* 19.2* 18.6*  PLT 114* 126* 128*    Coag's Recent Labs  Lab 03/17/18 0336 03/17/18 0520 03/17/18 0952  APTT  --  33 34  INR 3.12 3.02 2.53    Sepsis Markers Recent Labs  Lab 03/17/18 0336 03/17/18 0520 03/17/18 0953  LATICACIDVEN 3.7*  --  3.4*  PROCALCITON  --  <0.10  --     ABG Recent Labs  Lab 03/17/18 0311 03/17/18 0428  PHART 7.094* 7.313*  PCO2ART 57.3* 30.1*  PO2ART 382* 113*    Liver Enzymes Recent Labs  Lab 03/12/2018 1145 03/17/18 0336  AST 22 23   ALT 6* 6*  ALKPHOS 56 35*  BILITOT 0.7 0.4  ALBUMIN 4.0 2.5*    Cardiac Enzymes Recent Labs  Lab 03/17/18 0336 03/17/18 0520  TROPONINI 0.03* 0.09*    Glucose Recent Labs  Lab 03/15/18 1026 03/16/18 0821 03/17/18 0307 03/17/18 0415 03/17/18 0839  GLUCAP 98 151* 180* 201* 148*    Imaging Dg Abd 1 View  Result Date: 03/17/2018 CLINICAL DATA:  Orogastric tube placement. EXAM: ABDOMEN - 1 VIEW COMPARISON:  CT of the abdomen and pelvis performed 11/22/2015 FINDINGS: The patient's enteric tube is noted ending overlying the antrum of the stomach. The visualized bowel gas pattern is grossly unremarkable. No free intra-abdominal air is seen, though evaluation for free air is limited on a single supine view. External pacing pads are noted. Patient is status post median sternotomy. No acute osseous abnormalities are identified. IMPRESSION: Enteric tube noted ending overlying the antrum of the stomach. Electronically Signed   By: Garald Balding M.D.   On: 03/17/2018 06:16   Ct Head Wo Contrast  Result Date: 03/17/2018 CLINICAL DATA:  Status post cardiac arrest. EXAM: CT HEAD WITHOUT CONTRAST TECHNIQUE: Contiguous axial images were obtained from the base of the skull through the vertex without intravenous contrast. COMPARISON:  02/24/2018 FINDINGS: Brain: Subtle loss of gray-white differentiation is questioned in the right basal ganglia. There is mild hypoattenuation of the thalami which is similar to the prior study, and the left basal ganglia do not appear significantly changed. Cortical gray-white differentiation is similar to the prior study, and there is no sulcal or ventricular effacement. There is no evidence of intracranial hemorrhage, mass, midline shift, or extra-axial fluid collection. Moderately advanced cerebral atrophy is again noted with asymmetric ex vacuo enlargement of the atrium and occipital horn of the left lateral ventricle. Periventricular white matter hypodensities are  unchanged and nonspecific but compatible with mild-to-moderate chronic small vessel ischemic disease. Vascular: Calcified atherosclerosis at the skull base. Vertebrobasilar dolichoectasia. No hyperdense vessel. Skull: No fracture or focal osseous lesion. Sinuses/Orbits: Mild mucosal thickening in the ethmoid and sphenoid sinuses. Small fluid levels in the sphenoid sinuses. Clear mastoid air cells. Bilateral cataract extraction. Other: None. IMPRESSION: 1. Possible subtle loss of gray-white differentiation in the right basal ganglia which could reflect early changes of hypoxic ischemic encephalopathy. 2. No intracranial hemorrhage. 3. Chronic small vessel ischemic disease and advanced cerebral atrophy. Electronically Signed   By: Logan Bores M.D.   On: 03/17/2018 08:45   Dg Chest Port 1 View  Result Date: 03/17/2018 CLINICAL DATA:  Central line placement. EXAM: PORTABLE CHEST 1 VIEW COMPARISON:  Chest radiograph performed earlier today at 3:14 a.m. FINDINGS: The patient's left IJ line is noted ending about the proximal SVC. The patient's endotracheal tube  is seen ending 4-5 cm above the carina. An enteric tube is noted extending below the diaphragm. Mild left basilar atelectasis is noted. No pleural effusion or pneumothorax is seen. The cardiomediastinal silhouette is mildly enlarged. The patient is status post median sternotomy. No acute osseous abnormalities are identified. IMPRESSION: 1. Left IJ line noted ending about the proximal SVC. 2. Endotracheal tube seen ending 4-5 cm above the carina. 3. Mild left basilar atelectasis. 4. Mild cardiomegaly. Electronically Signed   By: Garald Balding M.D.   On: 03/17/2018 06:13   Dg Chest Port 1 View  Result Date: 03/17/2018 CLINICAL DATA:  ETT placement EXAM: PORTABLE CHEST 1 VIEW COMPARISON:  01/19/2017 FINDINGS: Endotracheal tube tip is about 5.6 cm superior to the carina. Post sternotomy changes. Mild cardiomegaly with aortic atherosclerosis. No acute  consolidation or effusion. No pneumothorax. IMPRESSION: 1. Endotracheal tube tip about 5.6 cm superior to the carina 2. Mild cardiomegaly without edema or infiltrate Electronically Signed   By: Donavan Foil M.D.   On: 03/17/2018 03:35    STUDIES:  Head CT (03/22/2018): Chronic atrophic and ischemic changes without acute abnormality.  Renal Ultrasound (03/07/2018): Moderate bilateral hydronephrosis is likely due to a mass lesion in the urinary bladder. This could be a primary bladder tumor or could be related to the prostate gland. The prostate is difficult to distinguish from the lesion.  CXR (03/17/18): 1. Endotracheal tube tip about 5.6 cm superior to the carina. 2. Mild cardiomegaly without edema or infiltrate   CULTURES: Urine culture (4/20): no growth Sputum culture (4/22): pending Blood cultures (4/22): ordered  ANTIBIOTICS: Bactrim 4/21 (now stopped)  SIGNIFICANT EVENTS: 4/19: admitted to [facility] with AKI and Hematuria 4/22 AM: PEA arrest due to acidosis (combination metabolic and respiratory), transferring to [facility] ICU for cooling  LINES/TUBES: [facility]J TLC 03/17/18 >> 3-way Foley 03/15/18 >> OG tube 03/17/18 >> PIV's  DISCUSSION: 77yoM with hx Afib (on coumadin), OSA, Parkinsons, HTN, HLD, Hepatic steatosis, Gout, DM, Dementia, CAD, and AAA, admitted 03/16/2018 with hematuria and AKI, 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 Patient's AKI continued to worsen. He continued to have hematuria in setting of supratherapeutic INR, and early this AM developed cardiac arrest, now s/p ROSC but not and neurologic baseline so starting therapeutic hypothermia.   ASSESSMENT / PLAN:  PULMONARY A: Acute Hypoxic and Hypercapneic Respiratory failure  OSA - noncompliant with CPAP Vent asynchrony  P:   Full MV support, PRVC 8cc/kg Rate 20 Insert Aline and then repeat ABG CXR and ABG in AM VAP protocol    CARDIOVASCULAR A:  Cardiac arrest- PEA vs asystole, ROSC after 5 mins - unclear downtime as he was found agonal and not on telemetry Shock- unclear etiology at this time hypovolemic vs cardiogenic vs less likely component of septic with no clear source Elevated troponin- mild Hx Afib on coumadin, CAD, HTN P:  Tele monitoring Continue levophed and neo for map > 65 Assess CVP, insert Aline Cortisol 21.8, continue solu-cortef 50 mg q 6 TTE pending Trending lactate acide Trend troponin and EKG Holding coumadin   RENAL A:   AKI  Baseline sCr 0.7- 1.0 Hyperchloremic/ lactic/ metabolic acidosis  Hydronephrosis w/ bladder tumor s/p continous bladder irrigation - s/p 30 cc/kg fluid bolus P:   Consider Renal consult after pending repeat renal U/S, if hydronephrosis remains, may need nephrostomy tubes Urology following for bladder tumor Repeat ABG, and consider d/c bicarb gtt continue flomax daily Renal panel q  12 Mag 2 gm now Trend mag/ phos/ daily wt/ urinary output  GASTROINTESTINAL A:   NPO P:   PPI for SUP Start TF NPO  HEMATOLOGIC A:   Anemia - baseline 13-14 Supratherapeutic INR on coumadin with hematuria P:  Pending transfusion of PRBC and FFP Trend CBC and coags Transfuse for Hgb < 7  SCDs only  INFECTIOUS A:  No known acute process  - CXR and UA neg - PCT neg P:   Trend WBC Monitor clinically   ENDOCRINE A:   Hypothyroidism P:   Continue synthroid CBG q 4 while NPO  NEUROLOGIC A:   Acute encephalopathy with Concern for Anoxic Encephalopathy vs metabolic derangements hx Parkinsons disease with Dementia  - wife reports some myoclonic jerks at home associated with his sleeping And that patient does not handle narcotics well- increased confusion and hallucinations, not on at home,  P:   RASS goal: -1 TTM initated at 36 given Hgb/ bleeding risk Add precedex with PAD protocol Minimize fentanyl gtt as able EEG pending  FAMILY  - Updates:  Wife updated at bedside.    - Inter-disciplinary family meet or Palliative Care meeting due by:  4/29  CCT 45 mins  Kennieth Rad, AGACNP-BC [facility] Pgr: [phone] or if no answer [phone] 03/17/2018, 11:54 AM

## 2018-03-17 NOTE — Progress Notes (Signed)
Arterial line insertion attempted by RT x's 1, [facility] here to transport pt to [facility].  A-line insertion to be completed upon arrival to [facility] per MD

## 2018-03-17 NOTE — Progress Notes (Signed)
Initial Nutrition Assessment  DOCUMENTATION CODES:   Not applicable  INTERVENTION:   Recommend initiation of TF within 24-48 hours.   Tube Feeding:  Vital High Protein @ 70 ml/hr Provides 148 g of protein, 1680 kcals, 1411 mL of free water Meets 100% estimated calorie and protein needs   NUTRITION DIAGNOSIS:   Inadequate oral intake related to acute illness as evidenced by NPO status.  GOAL:   Patient will meet greater than or equal to 90% of their needs  MONITOR:   Vent status, Labs, Weight trends  REASON FOR ASSESSMENT:   Ventilator    ASSESSMENT:    77 yo male admitted to [facility] on 4/19 with hematuria and AKI. Pt with PEA arrest on 4/22 due to acidosis with transfer to [facility]. Pt with hx of OSA, Parkinson's, HTN, HLD, hepatic steatosis, DM, dementia, CAD, AAA   Pt currently on TTM (Normothermia 36 degrees) Patient is currently intubated on ventilator support MV: 11.7 L/min Temp (24hrs), Avg:97.2 F (36.2 C), Min:94.8 F (34.9 C), Max:98.4 F (36.9 C)  Weight relatively stable per weight encounters x 1 year. Unable to obtain diet and weight history from pt at this time.   Labs: Creatinine 4.52, BUN 55 Meds: fentanyl, levophed, neo-synephrine  NUTRITION - FOCUSED PHYSICAL EXAM:  Unable to complete at this time  Diet Order:  Fall precautions Diet NPO time specified  EDUCATION NEEDS:   Not appropriate for education at this time  Skin:  Skin Assessment: Reviewed RN Assessment  Last BM:  4/20  Height:   Ht Readings from Last 1 Encounters:  03/17/18 5\' 7"  (1.702 m)    Weight:   Wt Readings from Last 1 Encounters:  03/17/18 196 lb 9.6 oz (89.2 kg)    Ideal Body Weight:  67.3 kg  BMI:  Body mass index is 30.79 kg/m.  Estimated Nutritional Needs:   Kcal:  0867 kcals  Protein:  130-170 g  Fluid:  >/= 2 L   Kerman Passey MS, RD, LDN, CNSC [phone] Pager  [phone] Weekend/On-Call Pager

## 2018-03-18 ENCOUNTER — Inpatient Hospital Stay (HOSPITAL_COMMUNITY): Payer: Medicare Other

## 2018-03-18 DIAGNOSIS — D494 Neoplasm of unspecified behavior of bladder: Secondary | ICD-10-CM | POA: Diagnosis present

## 2018-03-18 DIAGNOSIS — R571 Hypovolemic shock: Secondary | ICD-10-CM

## 2018-03-18 DIAGNOSIS — N1330 Unspecified hydronephrosis: Secondary | ICD-10-CM | POA: Diagnosis present

## 2018-03-18 DIAGNOSIS — G253 Myoclonus: Secondary | ICD-10-CM | POA: Diagnosis not present

## 2018-03-18 DIAGNOSIS — G931 Anoxic brain damage, not elsewhere classified: Secondary | ICD-10-CM | POA: Diagnosis not present

## 2018-03-18 LAB — PREPARE FRESH FROZEN PLASMA: Unit division: 0

## 2018-03-18 LAB — CBC WITH DIFFERENTIAL/PLATELET
BASOS ABS: 0 10*3/uL (ref 0.0–0.1)
Basophils Relative: 0 %
EOS PCT: 0 %
Eosinophils Absolute: 0 10*3/uL (ref 0.0–0.7)
HCT: 20.8 % — ABNORMAL LOW (ref 39.0–52.0)
HEMOGLOBIN: 7.1 g/dL — AB (ref 13.0–17.0)
Lymphocytes Relative: 7 %
Lymphs Abs: 0.8 10*3/uL (ref 0.7–4.0)
MCH: 29.6 pg (ref 26.0–34.0)
MCHC: 34.1 g/dL (ref 30.0–36.0)
MCV: 86.7 fL (ref 78.0–100.0)
Monocytes Absolute: 0.7 10*3/uL (ref 0.1–1.0)
Monocytes Relative: 7 %
NEUTROS ABS: 9.5 10*3/uL — AB (ref 1.7–7.7)
NEUTROS PCT: 86 %
PLATELETS: 77 10*3/uL — AB (ref 150–400)
RBC: 2.4 MIL/uL — AB (ref 4.22–5.81)
RDW: 15.7 % — ABNORMAL HIGH (ref 11.5–15.5)
WBC: 11.1 10*3/uL — AB (ref 4.0–10.5)

## 2018-03-18 LAB — POCT I-STAT 3, ART BLOOD GAS (G3+)
Acid-Base Excess: 8 mmol/L — ABNORMAL HIGH (ref 0.0–2.0)
Bicarbonate: 31.3 mmol/L — ABNORMAL HIGH (ref 20.0–28.0)
O2 SAT: 99 %
TCO2: 32 mmol/L (ref 22–32)
pCO2 arterial: 37.9 mmHg (ref 32.0–48.0)
pH, Arterial: 7.522 — ABNORMAL HIGH (ref 7.350–7.450)
pO2, Arterial: 106 mmHg (ref 83.0–108.0)

## 2018-03-18 LAB — GLUCOSE, CAPILLARY
Glucose-Capillary: 122 mg/dL — ABNORMAL HIGH (ref 65–99)
Glucose-Capillary: 143 mg/dL — ABNORMAL HIGH (ref 65–99)
Glucose-Capillary: 96 mg/dL (ref 65–99)
Glucose-Capillary: 120 mg/dL — ABNORMAL HIGH (ref 65–99)
Glucose-Capillary: 150 mg/dL — ABNORMAL HIGH (ref 65–99)
Glucose-Capillary: 130 mg/dL — ABNORMAL HIGH (ref 65–99)

## 2018-03-18 LAB — BASIC METABOLIC PANEL
Anion gap: 9 (ref 5–15)
Anion gap: 11 (ref 5–15)
BUN: 59 mg/dL — AB (ref 6–20)
BUN: 69 mg/dL — ABNORMAL HIGH (ref 6–20)
CALCIUM: 7 mg/dL — AB (ref 8.9–10.3)
CO2: 27 mmol/L (ref 22–32)
CO2: 30 mmol/L (ref 22–32)
CREATININE: 4.6 mg/dL — AB (ref 0.61–1.24)
CREATININE: 5.04 mg/dL — AB (ref 0.61–1.24)
Calcium: 6.8 mg/dL — ABNORMAL LOW (ref 8.9–10.3)
Chloride: 108 mmol/L (ref 101–111)
Chloride: 104 mmol/L (ref 101–111)
GFR calc Af Amer: 11 mL/min — ABNORMAL LOW (ref >60)
GFR calc non Af Amer: 10 mL/min — ABNORMAL LOW (ref >60)
GFR, EST AFRICAN AMERICAN: 13 mL/min — AB (ref >60)
GFR, EST NON AFRICAN AMERICAN: 11 mL/min — AB (ref >60)
GLUCOSE: 153 mg/dL — AB (ref 65–99)
Glucose, Bld: 129 mg/dL — ABNORMAL HIGH (ref 65–99)
POTASSIUM: 3.9 mmol/L (ref 3.5–5.1)
Potassium: 3.7 mmol/L (ref 3.5–5.1)
SODIUM: 144 mmol/L (ref 135–145)
Sodium: 145 mmol/L (ref 135–145)

## 2018-03-18 LAB — BPAM FFP
BLOOD PRODUCT EXPIRATION DATE: 201904272359
ISSUE DATE / TIME: 201904221515
UNIT TYPE AND RH: 6200

## 2018-03-18 LAB — PHOSPHORUS
Phosphorus: 5.7 mg/dL — ABNORMAL HIGH (ref 2.5–4.6)
Phosphorus: 6.6 mg/dL — ABNORMAL HIGH (ref 2.5–4.6)
Phosphorus: 6.1 mg/dL — ABNORMAL HIGH (ref 2.5–4.6)

## 2018-03-18 LAB — PROTIME-INR
INR: 1.61
Prothrombin Time: 19 seconds — ABNORMAL HIGH (ref 11.4–15.2)

## 2018-03-18 LAB — TROPONIN I: Troponin I: 0.19 ng/mL (ref <0.03)

## 2018-03-18 LAB — HEMOGLOBIN AND HEMATOCRIT, BLOOD
HEMATOCRIT: 25.8 % — AB (ref 39.0–52.0)
HEMOGLOBIN: 8.8 g/dL — AB (ref 13.0–17.0)

## 2018-03-18 LAB — PROCALCITONIN: PROCALCITONIN: 2.29 ng/mL

## 2018-03-18 LAB — PREPARE RBC (CROSSMATCH)

## 2018-03-18 LAB — MAGNESIUM
MAGNESIUM: 2.1 mg/dL (ref 1.7–2.4)
MAGNESIUM: 2.1 mg/dL (ref 1.7–2.4)
Magnesium: 2.1 mg/dL (ref 1.7–2.4)

## 2018-03-18 MED ORDER — PRO-STAT SUGAR FREE PO LIQD
30.0000 mL | Freq: Four times a day (QID) | ORAL | Status: DC
  Administered 2018-03-18 – 2018-03-19 (×7): 30 mL
  Filled 2018-03-18 (×7): qty 30

## 2018-03-18 MED ORDER — VITAL AF 1.2 CAL PO LIQD
1000.0000 mL | ORAL | Status: DC
  Administered 2018-03-18 – 2018-03-19 (×2): 1000 mL
  Filled 2018-03-18 (×2): qty 1000

## 2018-03-18 MED ORDER — SODIUM CHLORIDE 0.9% FLUSH
10.0000 mL | Freq: Two times a day (BID) | INTRAVENOUS | Status: DC
  Administered 2018-03-18: 10 mL

## 2018-03-18 MED ORDER — VITAL HIGH PROTEIN PO LIQD: 1000.0000 mL | ORAL | Status: DC

## 2018-03-18 MED ORDER — SODIUM CHLORIDE 0.9% FLUSH: 10.0000 mL | INTRAVENOUS | Status: DC | PRN

## 2018-03-18 MED ORDER — SODIUM CHLORIDE 0.9 % IV SOLN
Freq: Once | INTRAVENOUS | Status: AC
  Administered 2018-03-18: 11:00:00 via INTRAVENOUS

## 2018-03-18 MED ORDER — IPRATROPIUM-ALBUTEROL 0.5-2.5 (3) MG/3ML IN SOLN: 3.0000 mL | Freq: Four times a day (QID) | RESPIRATORY_TRACT | Status: DC | PRN

## 2018-03-18 MED ORDER — CHLORHEXIDINE GLUCONATE CLOTH 2 % EX PADS
6.0000 | MEDICATED_PAD | Freq: Every day | CUTANEOUS | Status: DC
  Administered 2018-03-18 – 2018-03-20 (×3): 6 via TOPICAL

## 2018-03-18 NOTE — Progress Notes (Signed)
Attempted to see pt today. Pt not medically ready.  Will attempt back at later date.  Jinger Neighbors, [place] [phone]

## 2018-03-18 NOTE — Progress Notes (Signed)
Dr. Tresa Moore (urology), paged regarding CBI drainage.

## 2018-03-18 NOTE — Progress Notes (Signed)
Nutrition Follow-up  DOCUMENTATION CODES:   Not applicable  INTERVENTION:   Tube Feeding:  Vital AF 1.2 @ 45 ml/hr Pro-Stat 30 mL QID Provides 1696 kcals, 141 g of protein, 875 mL of fluid Meets 100% protein needs, 102% calorie needs  NUTRITION DIAGNOSIS:   Inadequate oral intake related to acute illness as evidenced by NPO status.  Being addressed via  TF   GOAL:   Patient will meet greater than or equal to 90% of their needs  Progressing  MONITOR:   Vent status, Labs, Weight trends  REASON FOR ASSESSMENT:   Ventilator    ASSESSMENT:    77 yo male admitted to [facility] on 4/19 with hematuria and AKI. Pt with PEA arrest on 4/22 due to acidosis with transfer to [facility]. Pt with hx of OSA, Parkinson's, HTN, HLD, hepatic steatosis, DM, dementia, CAD, AAA  Remains on TTM (normothermia 36 degrees), starting re-warming phase Patient is currently intubated on ventilator support MV: 9.1 L/min Temp (24hrs), Avg:97.1 F (36.2 C), Min:95.9 F (35.5 C), Max:98.8 F (37.1 C)  Gross hematuria requiring blood transfusion  Wife at bedside today and reports pt with good appetite with no weight changes PTA. Wife reporting pt is fairly active; he exercises 2x weekly with Parkinson's therapist and also takes a boxing class for Parkinson's patients 2x week as well.   Weight up since admission; current wt 213 pounds; RD utilizing weight of 196 as EDW. Pt with moderate edema  Labs: phosphorus 5.7 (H), [place] 4.60, BUN 59, calcium 6.8 (no recent albumin available for correction) Meds: sodium bicarb @ 150 ml/hr, levophed  NUTRITION - FOCUSED PHYSICAL EXAM:    Most Recent Value  Orbital Region  No depletion  Upper Arm Region  No depletion  Thoracic and Lumbar Region  No depletion  Buccal Region  Unable to assess  Temple Region  No depletion  Clavicle Bone Region  No depletion  Clavicle and Acromion Bone Region  No depletion  Scapular Bone Region  No depletion  Dorsal  Hand  No depletion  Patellar Region  No depletion  Anterior Thigh Region  No depletion  Posterior Calf Region  No depletion  Edema (RD Assessment)  Moderate  Hair  Reviewed  Eyes  Unable to assess  Mouth  Unable to assess  Skin  Reviewed  Nails  Reviewed       Diet Order:  Fall precautions Diet NPO time specified  EDUCATION NEEDS:   Not appropriate for education at this time  Skin:  Skin Assessment: Reviewed RN Assessment  Last BM:  4/20  Height:   Ht Readings from Last 1 Encounters:  03/17/18 5\' 7"  (1.702 m)    Weight:   Wt Readings from Last 1 Encounters:  03/18/18 213 lb 10 oz (96.9 kg)    Ideal Body Weight:  67.3 kg  BMI:  Body mass index is 33.46 kg/m.  Estimated Nutritional Needs:   Kcal:  5366 kcals  Protein:  130-170 g  Fluid:  >/= 2 L   Kerman Passey MS, RD, LDN, CNSC [phone] Pager  [phone] Weekend/On-Call Pager

## 2018-03-18 NOTE — Progress Notes (Signed)
Pt stable overnight. Appreciate [facility]'s help. No catheter related issues. CBI running clear.  Hand irrigation difficult-some clot may be left in bladder. Cath hooked back up to CBI.  Continue CBI for now. Hopefully can obviate need for OR.

## 2018-03-18 NOTE — Progress Notes (Signed)
PT Cancellation Note  Patient Details Name: Troy [name] MRN: [id number] DOB: 05-17-1941   Cancelled Treatment:    Reason Eval/Treat Not Completed: Patient not medically ready. Will follow-up for PT treatment tomorrow as pt appropriate.  Mabeline Caras, PT, DPT Acute Rehab Services  Pager: [place] 03/18/2018, 8:31 AM

## 2018-03-18 NOTE — Progress Notes (Addendum)
Pulmonary/Critical Care Progress Note  Patient Name: Troy [name] MRN: [id number] DOB: 09-Mar-1941    ADMISSION DATE:  03/08/2018  CHIEF COMPLAINT: Cardiac arrest   ASSESSMENT/PLAN:  ASSESSMENT (included in the Hospital Problem List)  Principal Problem:   Hematuria, gross Active Problems:   Anoxic encephalopathy (HCC)   Hypovolemic shock (HCC)   Acute kidney injury ([place])   Coronary artery disease   Chronic anticoagulation   Dementia   Hydronephrosis   Bladder tumor   AAA (abdominal aortic aneurysm) without rupture (HCC)   Persistent atrial fibrillation (HCC)   Hypertension   Hyperlipidemia   OSA (obstructive sleep apnea)   Hepatic steatosis   Parkinsonism (HCC)   Myoclonic jerking   PULMONARY  Acute Hypoxic and Hypercapnic Respiratory failure   OSA - noncompliant with CPAP ET tube needs to be advanced 2 cm. CXR and ABG in AM VAP protocol   CARDIOVASCULAR  Cardiac arrest- PEA vs asystole, ROSC after 5 mins: unclear downtime as he was found agonal and not on telemetry  Shock (hypovolemic/hemorrhagic plus other)- unclear etiology at this time hypovolemic vs cardiogenic vs less likely component of septic with no clear source  Elevated troponin- mild  Atrial fibrillation, on long-term warfarin therapy  Coronary artery disease  Metabolic syndrome (hypertension, hyperlipidemia, type 2 diabetes)  Abdominal aortic aneurysm Titrate norepinephrine gtt for MAP 65. Type and screen.  Transfuse 2 units PRBC.  Continue transfusion to hemoglobin 10. Decrease Solu-Cortef to 50 mg IV every 8 hours. Hold Coumadin.  RENAL  AKI  (baseline Cr 0.7-1.0)  Lactic acidosis  Hydronephrosis w/ bladder tumor s/p continous bladder irrigation  Gross hematuria Urology help appreciated Continue flomax daily Trend mag/ phos/daily wt/ urinary output  GASTROINTESTINAL  NPO  GI prophylaxis: Protonix Start TF NPO  HEMATOLOGIC  Anemia - baseline  13-14  Supratherapeutic INR on coumadin with hematuria Transfuse to Hgb 10. Trend CBC and coags SCDs only  INFECTIOUS  No known acute process  - CXR and UA neg - PCT neg Trend WBC Monitor clinically   ENDOCRINE  Hypothyroidism Continue synthroid CBG q 4 while NPO  NEUROLOGIC  Acute anoxic/ischemic encephalopathy  Parkinson's disease with advanced dementia  Myoclonic jerks Continue sedation vacation until RASS 0. Rewarming per protocol.     SUBJECTIVE:   -Interim events: On therapeutic hypothermia (normothermia protocol).  About to start rewarming phase.  Continues to have gross hematuria on continuous bladder irrigation. Urology replaced Foley last night. Help appreciated. Hgb 6.6-->7.1. EEG showed diffuse slowing.  Currently off sedation for sedation vacation (Precedex, fentanyl).  Occasional myoclonic jerks.  2D echo done, rotation pending.  The patient's wife is at the bedside and reports that the patient has advanced dementia.  She adds that he has had previous experiences (e.g. surgery/anesthesia), where he has taken exceedingly long periods of time to awake from sedation (about 5 days postoperatively).  HISTORY OF PRESENT ILLNESS [phone] with atrial fibrillation (on Coumadin), OSA, Parkinson's disease with advanced dementia, abdominal aortic aneurysm, coronary artery disease, HTN, DM, HLD, hepatic steatosis, and gout admitted 03/05/2018 with hematuria and AKI (Cr 3.01 on admission, baseline 0.7-1.0). He was also found at that time to have hematuria, for which Urology was consulted and started continuous bladder irrigation. Hgb on admission was 12.7, from baseline of 13-14. During admission, it continued to drop to a low of 7.7 on 4/21 PM. He is on chronic coumadin for his Afib, and on admission, his INR was 2.89, then climbing to 3.01, now 3.12. It does not appear  that his INR was reversed during this admission, despite the hematuria. But he was not given any Coumadin  either. For his AKI, he was given 500cc IVF bolus on 4/19 and another 1L IVF bolus on 4/21. He was continued on NS @ 50 cc/hr for several days. Patient was also started on Bactrim (reason being "due to extensive catheter manipulation") on 4/21. On 4/20 PM and again on 4/21 PM, patient refused his CPAP. Sitter was at bedside as patient was confused. At 3:24 am the sitter noted that patient had agonal breathing. He was found to be pulseless with a PEA rhythm. CPR was started with ROSC attained after 5 minutes. Post resuscitation, patient was making nonpurposeful movments of arms, with some myoclonic jerks but was not obeying any commands.   PAST MEDICAL/SURGICAL HISTORIES  Past Medical History:  Diagnosis Date  . AAA (abdominal aortic aneurysm) ([place])    with mural thrombus  . Chronic anticoagulation   . CKD (chronic kidney disease) stage 2, GFR 60-89 ml/min   . Constipation   . Coronary artery disease 07/2009   severe 3 vessel ASCAD s/p CABG with LIMA to LAD, SVG to diag, SVG to PL  . Dementia   . Diabetes mellitus   . Diverticulosis   . Edema extremities 09/03/2017  . Facial nerve spasm    L eye, tx'd with botox  . Fecal impaction ([place])   . Gout   . Hepatic steatosis   . Hyperlipidemia   . Hypertension   . Hypothyroidism   . OSA (obstructive sleep apnea)    intolerant to CPAP followed by Dr. Lamonte Sakai  . Parkinsonism ([place])   . Persistent atrial fibrillation [facility])     Past Surgical History:  Procedure Laterality Date  . ABDOMINAL AORTIC ANEURYSM REPAIR  01-2010   aorto-bi-iliac Gore Excluder stent graft  . CORONARY ARTERY BYPASS GRAFT  07-2009  . EYE SURGERY Left May  2013   Cataract  . EYE SURGERY Right June 2013   Cataract  . [place]     Prior to Admission medications   Medication Sig Start Date End Date Taking? Authorizing Provider  amLODipine (NORVASC) 10 MG tablet TAKE ONE TABLET BY MOUTH ONCE DAILY 12/30/17  Yes Turner, Traci R, MD  carbidopa-levodopa (SINEMET  CR) 50-200 MG tablet Take 1 tablet by mouth 4 (four) times daily. 7, 11, 3 pm and 7 pm 11/27/17  Yes Star Age, MD  carboxymethylcellulose (REFRESH PLUS) 0.5 % SOLN Place 1 drop into both eyes 3 (three) times daily as needed (dry eyes).   Yes [provider]  Cholecalciferol (VITAMIN D-3) 1000 UNITS CAPS Take 2,000 Units by mouth daily.    Yes [provider]  Cinnamon 500 MG capsule Take 1,000 mg by mouth daily.    Yes [provider]  clonazePAM (KLONOPIN) 0.25 MG disintegrating tablet DISSOLVE 2 TABLETS IN MOUTH AT BEDTIME 01/13/18  Yes Star Age, MD  DHA-EPA-Vitamin E 161-096-0 MG-MG-UNIT CAPS Take 2 capsules by mouth daily.    Yes [provider]  donepezil (ARICEPT) 10 MG tablet Take 1 tablet (10 mg total) by mouth daily with breakfast. 01/13/18  Yes Star Age, MD  fluticasone (FLONASE) 50 MCG/ACT nasal spray Place 2 sprays into the nose daily as needed for allergies.    Yes [provider]  levothyroxine (SYNTHROID, LEVOTHROID) 125 MCG tablet Take 125 mcg by mouth daily.     Yes [provider]  lisinopril (PRINIVIL,ZESTRIL) 40 MG tablet TAKE 1 TABLET  BY MOUTH ONCE DAILY 12/02/17  Yes Turner, Eber Hong, MD  memantine (NAMENDA) 10 MG tablet Take 1 tablet (10 mg total) by mouth 2 (two) times daily. 01/13/18  Yes Star Age, MD  OVER THE COUNTER MEDICATION Take 1 tablet by mouth 2 (two) times daily. Macular degeneration supplement   Yes [provider]  OVER THE COUNTER MEDICATION Take 1 capsule by mouth daily. Homocysteine supplement   Yes [provider]  rosuvastatin (CRESTOR) 5 MG tablet TAKE 1 TABLET BY MOUTH ONCE DAILY 02/25/18  Yes Turner, Traci R, MD  warfarin (COUMADIN) 5 MG tablet TAKE AS DIRECTED BY COUMADIN CLINIC Patient taking differently: Take 5-7.5 mg by mouth See admin instructions. Take 1 tablet (5 mg) by mouth daily except on Tuesdays Take 1.5 tablets (7.5 mg). 11/18/17  Yes Turner, Eber Hong, MD  EPIPEN  2-PAK 0.3 MG/0.3ML SOAJ Inject 0.3 mg into the muscle once as needed. Allergic reaction 02/25/13   [provider]  guaiFENesin (MUCINEX) 600 MG 12 hr tablet Take 2 tablets (1,200 mg total) by mouth 2 (two) times daily. Patient not taking: Reported on 03/07/2018 01/21/17   Oswald Hillock, MD  tamsulosin (FLOMAX) 0.4 MG CAPS capsule Take 0.4 mg by mouth daily.  02/28/18   [provider]    Allergies  Allergen Reactions  . Bee Venom Anaphylaxis  . Other Anaphylaxis    Other reaction(s): Laryngeal Edema (ALLERGY) "a heart medication" ? unknown  . Tamsulosin Other (See Comments)    Insomnia  . Penicillins Rash    Has patient had a PCN reaction causing immediate rash, facial/tongue/throat swelling, SOB or lightheadedness with hypotension: Y Has patient had a PCN reaction causing severe rash involving mucus membranes or skin necrosis: Y Has patient had a PCN reaction that required hospitalization: N Has patient had a PCN reaction occurring within the last 10 years: N If all of the above answers are "NO", then may proceed with Cephalosporin use.   . Tetanus Toxoids Rash    Current Facility-Administered Medications:  .  0.9 %  sodium chloride infusion, 250 mL, Intravenous, PRN, Dewaine Oats, Katalina M, NP .  0.9 %  sodium chloride infusion, , Intravenous, Once, Eubanks, Katalina M, NP .  0.9 %  sodium chloride infusion, , Intravenous, Once, Hammonds, Sharyn Blitz, MD .  0.9 %  sodium chloride infusion, , Intravenous, Continuous, Hammonds, Sharyn Blitz, MD .  acetaminophen (TYLENOL) tablet 650 mg, 650 mg, Oral, Q6H PRN **OR** acetaminophen (TYLENOL) suppository 650 mg, 650 mg, Rectal, Q6H PRN, Alma Friendly, MD .  chlorhexidine gluconate (MEDLINE KIT) (PERIDEX) 0.12 % solution 15 mL, 15 mL, Mouth Rinse, BID, Alma Friendly, MD, 15 mL at 03/17/18 2006 .  dexmedetomidine (PRECEDEX) 200 MCG/50ML (4 mcg/mL) infusion, 0.4-2 mcg/kg/hr, Intravenous, Titrated, Jennelle Human B, NP,  Stopped at 03/18/18 [phone] .  docusate (COLACE) 50 MG/5ML liquid 100 mg, 100 mg, Per Tube, BID, Hammonds, Sharyn Blitz, MD, 100 mg at 03/17/18 2126 .  famotidine (PEPCID) IVPB 20 mg premix, 20 mg, Intravenous, Q24H, Lyndee Leo, RPH .  fentaNYL (SUBLIMAZE) bolus via infusion 25 mcg, 25 mcg, Intravenous, Q1H PRN, Omar Person, NP, 25 mcg at 03/17/18 1027 .  fentaNYL 2513mg in NS 2569m(1069mml) infusion-PREMIX, 25-400 mcg/hr, Intravenous, Continuous, GraSampson GoonD, Stopped at 03/18/18 0745 .  finasteride (PROSCAR) tablet 5 mg, 5 mg, Oral, Daily, McKenzie, PatCandee FurbishD .  hydrocortisone sodium succinate (SOLU-CORTEF) 100 MG injection 50 mg, 50 mg, Intravenous, Q6H, Hammonds, KatNunzio Cory  H, MD, 50 mg at 03/18/18 0617 .  insulin aspart (novoLOG) injection 2-6 Units, 2-6 Units, Subcutaneous, Q4H, Sampson Goon, MD, 2 Units at 03/18/18 0422 .  levothyroxine (SYNTHROID, LEVOTHROID) tablet 125 mcg, 125 mcg, Oral, QAC breakfast, Alma Friendly, MD, 125 mcg at 03/16/18 1233 .  MEDLINE mouth rinse, 15 mL, Mouth Rinse, 10 times per day, Alma Friendly, MD, 15 mL at 03/18/18 0616 .  norepinephrine (LEVOPHED) 16 mg in dextrose 5 % 250 mL (0.064 mg/mL) infusion, 0-50 mcg/min, Intravenous, Titrated, Alma Friendly, MD, Last Rate: 2.8 mL/hr at 03/18/18 0600, 2.987 mcg/min at 03/18/18 0600 .  ondansetron (ZOFRAN) tablet 4 mg, 4 mg, Oral, Q6H PRN **OR** ondansetron (ZOFRAN) injection 4 mg, 4 mg, Intravenous, Q6H PRN, Alma Friendly, MD .  phenylephrine (NEO-SYNEPHRINE) 40 mg in sodium chloride 0.9 % 250 mL (0.16 mg/mL) infusion, 0-400 mcg/min, Intravenous, Titrated, Alma Friendly, MD, Stopped at 03/17/18 1645 .  polyethylene glycol (MIRALAX / GLYCOLAX) packet 17 g, 17 g, Oral, Daily PRN, Alma Friendly, MD .  polyvinyl alcohol (LIQUIFILM TEARS) 1.4 % ophthalmic solution 1 drop, 1 drop, Both Eyes, TID PRN, Berton Mount, RPH .  rosuvastatin (CRESTOR) tablet 5 mg, 5  mg, Oral, QHS, Alma Friendly, MD, 5 mg at 03/17/18 2126 .  sennosides (SENOKOT) 8.8 MG/5ML syrup 5 mL, 5 mL, Per Tube, QHS, Hammonds, Sharyn Blitz, MD, 5 mL at 03/17/18 2133 .  sodium bicarbonate 150 mEq in sterile water 1,000 mL infusion, , Intravenous, Continuous, Hammonds, Sharyn Blitz, MD, Last Rate: 150 mL/hr at 03/18/18 0600 .  tamsulosin (FLOMAX) capsule 0.4 mg, 0.4 mg, Oral, Daily, Alma Friendly, MD, 0.4 mg at 03/17/18 1023 .  THROMBI-PAD 3"X3" pad 1 each, 1 each, Topical, Once, Hammonds, Sharyn Blitz, MD   OBJECTIVE:   VITAL SIGNS: BP 118/67   Pulse 68   Temp (!) 97 F (36.1 C) (Esophageal)   Resp 20   Ht '5\' 7"'  (1.702 m)   Wt 96.9 kg (213 lb 10 oz)   SpO2 100%   BMI 33.46 kg/m  Vitals:   03/18/18 0600 03/18/18 0615 03/18/18 0630 03/18/18 0802  BP: 106/74   118/67  Pulse:    68  Resp: (!) '21 20 20 20  ' Temp: (!) 97 F (36.1 C)     TempSrc: Esophageal     SpO2: 100% 100% 100% 100%  Weight:      Height:        HEMODYNAMICS: CVP:  [3 mmHg-5 mmHg] 3 mmHg  VENTILATOR SETTINGS: Vent Mode: PRVC FiO2 (%):  [40 %] 40 % Set Rate:  [20 bmp] 20 bmp Vt Set:  [530 mL] 530 mL PEEP:  [5 cmH20] 5 cmH20 Plateau Pressure:  [16 cmH20-19 cmH20] 18 cmH20  INTAKE / OUTPUT: I/O last 3 completed shifts: In: 94765 [P.O.:3550; I.V.:6970.2; Blood:2270.8; YYTKP:54656; IV Piggyback:50] Out: [id number] [CLEXN:17001]  PHYSICAL EXAMINATION: GENERAL: Intubated.  Sedated, unresponsive.  No acute distress. HEAD: normocephalic, atraumatic EYE: PERRLA, EOM intact, no scleral icterus, no pallor. THROAT/ORAL CAVITY: ETT in situ. Mallampati class cannot be determined at this time. NECK: supple, no thyromegaly, no JVD, no lymphadenopathy. Trachea midline.  Left IJ CVC in situ. CHEST/LUNG: symmetric in development and expansion.  Diminished air entry. no crackles. No wheezes. HEART: Regular S1 and S2 without murmur, rub or gallop. ABDOMEN: soft, nontender, nondistended. Normoactive bowel  sounds. No rebound. No guarding. No hepatosplenomegaly. EXTREMITIES: Edema: 1+. No cyanosis.  No clubbing. 2+ DP pulses LYMPHATIC: no  cervical/axiallary/inguinal lymph nodes appreciated MUSCULOSKELETAL: No bulk atrophy. SKIN: No rash or lesion. NEUROLOGIC: Leftward gaze preference.  Spontaneous respirations intact.  Facial symmetry.  Babinski absent.  Myoclonic jerks (right upper extremity, bilateral lower extremities).  DTR: 1+ @ R biceps, 1+ @ L biceps, 2+ @ R patellar,  2+ @ L patellar.   LABS:  SERUM CHEMISTRY Recent Labs  Lab 03/17/18 0500  03/17/18 0952 03/17/18 1550 03/18/18 0358  NA 148*   < > 142 144 144  K 4.4   < > 4.4 4.1 3.9  CL 116*   < > 114* 112* 108  CO2 19*   < > 20* 22 27  BUN 60*   < > 55* 56* 59*  CREATININE 4.23*   < > 4.52* 4.64* 4.60*  GLUCOSE 203*   < > 161* 107* 129*  CALCIUM 7.2*   < > 6.9* 6.9* 6.8*  MG 1.7  --   --   --  2.1  PHOS 5.9*  --   --   --  5.7*   < > = values in this interval not displayed.    Glucose Recent Labs  Lab 03/17/18 0307 03/17/18 0415 03/17/18 0839 03/17/18 1355 03/17/18 1604 03/17/18 2032  GLUCAP 180* 201* 148* 76 100* 121*    Liver Enzymes Recent Labs  Lab 02/26/2018 1145 03/17/18 0336  AST 22 23  ALT 6* 6*  ALKPHOS 56 35*  BILITOT 0.7 0.4  ALBUMIN 4.0 2.5*    CBC Recent Labs  Lab 03/17/18 0500 03/17/18 0952 03/17/18 1550 03/18/18 0358  WBC 18.5* 15.6*  --  11.1*  HGB 6.5* 6.2* 6.6* 7.1*  HCT 19.2* 18.6* 19.8* 20.8*  PLT 126* 128*  --  77*    COAGULATION STUDIES Recent Labs  Lab 03/17/18 0520 03/17/18 0952 03/18/18 0358  APTT 33 34  --   INR 3.02 2.53 1.61    Sepsis Markers Recent Labs  Lab 03/17/18 0520  03/17/18 1357 03/17/18 1553 03/17/18 2134 03/18/18 0358  LATICACIDVEN  --    < > 2.2* 1.0 0.8  --   PROCALCITON <0.10  --   --   --   --  2.29   < > = values in this interval not displayed.    ABG Recent Labs  Lab 03/17/18 0311 03/17/18 0428 03/17/18 1358  PHART 7.094*  7.313* 7.349*  PCO2ART 57.3* 30.1* 41.4  PO2ART 382* 113* 116.0*    Cardiac Enzymes Recent Labs  Lab 03/17/18 1357 03/17/18 1813 03/18/18 0017  TROPONINI 0.29* 0.24* 0.19*    CULTURES: Results for orders placed or performed during the hospital encounter of 03/13/2018  Culture, Urine     Status: None   Collection Time: 03/15/18 11:47 AM  Result Value Ref Range Status   Specimen Description   Final    URINE, CLEAN CATCH Performed at [facility], [place] [address], [place], [place] [address]    Special Requests   Final    NONE Performed at [facility], [place] [address], [place], [place] [address]    Culture   Final    NO GROWTH Performed at [facility], [place] [address], [place], [place] [address]    Report Status 03/16/2018 FINAL  Final  MRSA PCR Screening     Status: None   Collection Time: 03/17/18  4:09 AM  Result Value Ref Range Status   MRSA by PCR NEGATIVE NEGATIVE Final    Comment:  The GeneXpert MRSA Assay (FDA approved for NASAL specimens only), is one component of a comprehensive MRSA colonization surveillance program. It is not intended to diagnose MRSA infection nor to guide or monitor treatment for MRSA infections. Performed at [facility], [place] [address] [place], [place] [address]   Culture, respiratory (NON-Expectorated)     Status: None (Preliminary result)   Collection Time: 03/17/18  4:50 AM  Result Value Ref Range Status   Specimen Description   Final    TRACHEAL ASPIRATE Performed at [place] [address], [place], [place] [address]    Special Requests   Final    NONE Performed at [facility], [place] [address] [place], [place] [address]    Gram Stain   Final    FEW WBC PRESENT, PREDOMINANTLY PMN FEW GRAM NEGATIVE RODS FEW GRAM NEGATIVE COCCOBACILLI RARE GRAM POSITIVE COCCI IN PAIRS Performed at [facility], [place]  [address] [place], [place] [address]    Culture PENDING  Incomplete   Report Status PENDING  Incomplete     IMAGING: US Renal  Result Date: 03/17/2018 CLINICAL DATA:  Hydronephrosis. Supratherapeutic INR with hematuria and ongoing bladder irrigation. Possible bladder mass. EXAM: RENAL / URINARY TRACT ULTRASOUND COMPLETE COMPARISON:  Three days ago FINDINGS: Right Kidney: Length: 14 cm.  Moderate hydronephrosis without visible debris. Left Kidney: Length: 14 cm. Moderate hydronephrosis without visible debris. 2 cm upper pole cyst. Bladder: New luminal heterogeneous avascular mass about the Foley catheter balloon with elsewhere distended urinary bladder. This obscures the area concerning for bladder base mass on prior ultrasound. IMPRESSION: 1. Blood clot completely encompasses the Foley catheter and there is bladder distention with bilateral hydronephrosis. 2. Blood clot obscures the area concerning for bladder base mass on prior scan. Electronically Signed   By: Monte Fantasia M.D.   On: 03/17/2018 12:02   Dg Chest Port 1 View  Result Date: 03/18/2018 CLINICAL DATA:  Hypoxia EXAM: PORTABLE CHEST 1 VIEW COMPARISON:  March 17, 2018 FINDINGS: Endotracheal tube tip is 5.2 cm above the carina. Nasogastric tube tip and side port are below the diaphragm. Temporary pacemaker wires are attached to the right heart. Central catheter tip is in the superior vena cava. No pneumothorax. There is mild bibasilar atelectasis. No edema or consolidation evident. Heart is upper normal in size with pulmonary vascularity within normal limits. No adenopathy. There is aortic atherosclerosis. No evident bone lesions. IMPRESSION: Tube and catheter positions as described without pneumothorax. Mild bibasilar atelectasis. No edema or consolidation. Stable cardiac silhouette. There is aortic atherosclerosis. Aortic Atherosclerosis (ICD10-I70.0). Electronically Signed   By: Lowella Grip III M.D.   On: 03/18/2018 07:59    OTHER  STUDIES:  4/22: 2D echo done  ANTIBIOTICS: -  SIGNIFICANT EVENTS: 4/19: Admitted to [facility] with hematuria and acute kidney injury. 4/22: Found to demonstrate agonal breathing, PEA arrest.  Transferred to [facility] 2H for normothermia protocol. 4/23 beginning rewarming phase.  LINES/TUBES: Continuous bladder irrigation (4/19-) Endotracheally intubated (4/22-) Arterial line (4/22-) CVC, left IJ (4/22-)   My assessment, plan of care, findings, medications, side effects, etc. were discussed with:  Nurse  Wife and daughter (questions answered) updated   FAMILY  - Updates: Wife and daughter at bedside, updated.  - Inter-disciplinary family meet or Palliative Care meeting due by: 03/23/2018  Renee Pain, MD Board Certified by the ABIM, [place] Pager: [phone]  03/18/2018, 8:12 AM  Critical care time: 60 minutes. The treatment and management of the patient's condition was required based on the threat of imminent deterioration. This time reflects time spent by the physician evaluating, providing care and managing the critically ill patient's care. The time was spent at the immediate bedside (or on the same floor/unit and dedicated to this patient's care). Time involved in separately billable procedures is NOT included int he critical care time indicated above. Family meeting and update time may be included above if and only if the patient is unable/incompetent to participate in clinical interview and/or decision making, and the discussion was necessary to determining treatment decisions.  Renee Pain, MD

## 2018-03-18 NOTE — Progress Notes (Signed)
Called by NSG to evaluate catheter clotting off.  He is on CBI for large known bladder clot / hematuria during admission during which he has coded and is now minimally responseive.  Catheter irrigated by hand in 60cc aliquots with 15109mL H2O and approx 152ml old clot removed. Reconnected to NS irrigation at 2gtt per second with efflusk very light pink.  Pt's prognosis guarded. He is not surgical candidate for any GU operative intervention and family at bedside is in agreement with this.

## 2018-03-19 LAB — BASIC METABOLIC PANEL
ANION GAP: 12 (ref 5–15)
ANION GAP: 11 (ref 5–15)
BUN: 82 mg/dL — ABNORMAL HIGH (ref 6–20)
BUN: 98 mg/dL — ABNORMAL HIGH (ref 6–20)
CHLORIDE: 97 mmol/L — AB (ref 101–111)
CO2: 33 mmol/L — AB (ref 22–32)
CO2: 35 mmol/L — ABNORMAL HIGH (ref 22–32)
CREATININE: 5.29 mg/dL — AB (ref 0.61–1.24)
Calcium: 6.8 mg/dL — ABNORMAL LOW (ref 8.9–10.3)
Calcium: 7.3 mg/dL — ABNORMAL LOW (ref 8.9–10.3)
Chloride: 97 mmol/L — ABNORMAL LOW (ref 101–111)
Creatinine, Ser: 5.99 mg/dL — ABNORMAL HIGH (ref 0.61–1.24)
GFR calc Af Amer: 9 mL/min — ABNORMAL LOW (ref >60)
GFR calc non Af Amer: 9 mL/min — ABNORMAL LOW (ref >60)
GFR, EST AFRICAN AMERICAN: 11 mL/min — AB (ref >60)
GFR, EST NON AFRICAN AMERICAN: 8 mL/min — AB (ref >60)
Glucose, Bld: 154 mg/dL — ABNORMAL HIGH (ref 65–99)
Glucose, Bld: 151 mg/dL — ABNORMAL HIGH (ref 65–99)
POTASSIUM: 3.5 mmol/L (ref 3.5–5.1)
POTASSIUM: 3.3 mmol/L — AB (ref 3.5–5.1)
SODIUM: 142 mmol/L (ref 135–145)
SODIUM: 143 mmol/L (ref 135–145)

## 2018-03-19 LAB — CBC WITH DIFFERENTIAL/PLATELET
BASOS ABS: 0 10*3/uL (ref 0.0–0.1)
BLASTS: 0 %
Band Neutrophils: 0 %
Basophils Absolute: 0 10*3/uL (ref 0.0–0.1)
Basophils Relative: 0 %
Basophils Relative: 0 %
Eosinophils Absolute: 0 10*3/uL (ref 0.0–0.7)
Eosinophils Absolute: 0 10*3/uL (ref 0.0–0.7)
Eosinophils Relative: 0 %
Eosinophils Relative: 0 %
HCT: 24 % — ABNORMAL LOW (ref 39.0–52.0)
HEMATOCRIT: 21.7 % — AB (ref 39.0–52.0)
Hemoglobin: 7.3 g/dL — ABNORMAL LOW (ref 13.0–17.0)
Hemoglobin: 8.1 g/dL — ABNORMAL LOW (ref 13.0–17.0)
LYMPHS PCT: 6 %
Lymphocytes Relative: 4 %
Lymphs Abs: 0.7 10*3/uL (ref 0.7–4.0)
Lymphs Abs: 0.4 10*3/uL — ABNORMAL LOW (ref 0.7–4.0)
MCH: 29.3 pg (ref 26.0–34.0)
MCH: 29.6 pg (ref 26.0–34.0)
MCHC: 33.6 g/dL (ref 30.0–36.0)
MCHC: 33.8 g/dL (ref 30.0–36.0)
MCV: 87.1 fL (ref 78.0–100.0)
MCV: 87.6 fL (ref 78.0–100.0)
MONOS PCT: 5 %
MONOS PCT: 8 %
Metamyelocytes Relative: 0 %
Monocytes Absolute: 0.6 10*3/uL (ref 0.1–1.0)
Monocytes Absolute: 0.9 10*3/uL (ref 0.1–1.0)
Myelocytes: 0 %
NEUTROS ABS: 10.2 10*3/uL — AB (ref 1.7–7.7)
NEUTROS ABS: 9.4 10*3/uL — AB (ref 1.7–7.7)
NEUTROS PCT: 89 %
Neutrophils Relative %: 88 %
OTHER: 0 %
PLATELETS: 89 10*3/uL — AB (ref 150–400)
Platelets: 75 10*3/uL — ABNORMAL LOW (ref 150–400)
Promyelocytes Relative: 0 %
RBC: 2.49 MIL/uL — AB (ref 4.22–5.81)
RBC: 2.74 MIL/uL — AB (ref 4.22–5.81)
RDW: 15.1 % (ref 11.5–15.5)
RDW: 14.8 % (ref 11.5–15.5)
WBC: 11.5 10*3/uL — AB (ref 4.0–10.5)
WBC: 10.7 10*3/uL — AB (ref 4.0–10.5)
nRBC: 0 /100 WBC

## 2018-03-19 LAB — GLUCOSE, CAPILLARY
GLUCOSE-CAPILLARY: 139 mg/dL — AB (ref 65–99)
GLUCOSE-CAPILLARY: 148 mg/dL — AB (ref 65–99)
GLUCOSE-CAPILLARY: 165 mg/dL — AB (ref 65–99)
Glucose-Capillary: 153 mg/dL — ABNORMAL HIGH (ref 65–99)
Glucose-Capillary: 156 mg/dL — ABNORMAL HIGH (ref 65–99)
Glucose-Capillary: 141 mg/dL — ABNORMAL HIGH (ref 65–99)

## 2018-03-19 LAB — BLOOD GAS, ARTERIAL
ACID-BASE EXCESS: 11.9 mmol/L — AB (ref 0.0–2.0)
BICARBONATE: 35.8 mmol/L — AB (ref 20.0–28.0)
FIO2: 40
LHR: 14 {breaths}/min
MECHVT: 530 mL
O2 Saturation: 96.9 %
PATIENT TEMPERATURE: 98.4
PEEP/CPAP: 5 cmH2O
PH ART: 7.516 — AB (ref 7.350–7.450)
PO2 ART: 88.3 mmHg (ref 83.0–108.0)
pCO2 arterial: 44.5 mmHg (ref 32.0–48.0)

## 2018-03-19 LAB — PROCALCITONIN: Procalcitonin: 2.28 ng/mL

## 2018-03-19 LAB — PHOSPHORUS
Phosphorus: 6.8 mg/dL — ABNORMAL HIGH (ref 2.5–4.6)
Phosphorus: 6.9 mg/dL — ABNORMAL HIGH (ref 2.5–4.6)

## 2018-03-19 LAB — CULTURE, RESPIRATORY W GRAM STAIN: Culture: NORMAL

## 2018-03-19 LAB — PROTIME-INR
INR: 1.52
PROTHROMBIN TIME: 18.2 s — AB (ref 11.4–15.2)

## 2018-03-19 LAB — MAGNESIUM
MAGNESIUM: 2.2 mg/dL (ref 1.7–2.4)
Magnesium: 2.2 mg/dL (ref 1.7–2.4)

## 2018-03-19 LAB — CULTURE, RESPIRATORY

## 2018-03-19 LAB — PREPARE RBC (CROSSMATCH)

## 2018-03-19 MED ORDER — FUROSEMIDE 10 MG/ML IJ SOLN
80.0000 mg | Freq: Once | INTRAMUSCULAR | Status: AC
  Administered 2018-03-19: 80 mg via INTRAVENOUS
  Filled 2018-03-19: qty 8

## 2018-03-19 MED ORDER — SODIUM CHLORIDE 0.9 % IV SOLN
2.0000 g | Freq: Once | INTRAVENOUS | Status: AC
  Administered 2018-03-19: 2 g via INTRAVENOUS
  Filled 2018-03-19: qty 20

## 2018-03-19 MED ORDER — HYDROCORTISONE NA SUCCINATE PF 100 MG IJ SOLR
50.0000 mg | Freq: Four times a day (QID) | INTRAMUSCULAR | Status: DC
  Administered 2018-03-19 – 2018-03-20 (×4): 50 mg via INTRAVENOUS
  Filled 2018-03-19 (×4): qty 2

## 2018-03-19 MED ORDER — SODIUM CHLORIDE 0.9 % IV SOLN
Freq: Once | INTRAVENOUS | Status: AC
  Administered 2018-03-19: 14:00:00 via INTRAVENOUS

## 2018-03-19 NOTE — Progress Notes (Signed)
Lab values reported to [facility]. Orders to replace calcium were given.

## 2018-03-19 NOTE — Progress Notes (Signed)
[facility] ADULT ICU REPLACEMENT PROTOCOL FOR AM LAB REPLACEMENT ONLY  The patient does not apply for the [facility] Adult ICU Electrolyte Replacment Protocol based on the criteria listed below:    Is BUN < 60 mg/dL? No.  Patient's BUN today is 82 Abnormal electrolyte(s): K3.5   If a panic level lab has been reported, has the CCM MD in charge been notified? Yes.  .   Physician:  D Ramachandran,MD  Vear Clock 03/19/2018 5:16 AM

## 2018-03-19 NOTE — Progress Notes (Signed)
Pulmonary/Critical Care Progress Note  Patient Name: Troy [name] MRN: [id number] DOB: [birth year]    ADMISSION DATE:  02/27/2018  CHIEF COMPLAINT: Cardiac arrest   ASSESSMENT/PLAN:  ASSESSMENT (included in the Hospital Problem List)  Principal Problem:   Hematuria, gross Active Problems:   AAA (abdominal aortic aneurysm) without rupture (HCC)   Persistent atrial fibrillation (HCC)   Hypertension   Hyperlipidemia   Acute kidney injury ([place])   OSA (obstructive sleep apnea)   Hepatic steatosis   Coronary artery disease   Chronic anticoagulation   Parkinsonism (HCC)   Dementia   Myoclonic jerking   Anoxic encephalopathy (HCC)   Hypovolemic shock (HCC)   Hydronephrosis   Bladder tumor   PULMONARY  Acute Hypoxic and Hypercapnic Respiratory failure   OSA - noncompliant with CPAP CXR and ABG in AM VAP protocol  Overall doing well on SBT initially planned for extubation. Wife and daughter wish to have all family before extubating him as if he gets in trouble they do not wish for him to be reintubated. Will keep on light sedation and plan extubation in am Plan of care disucssed with nursing -- Continue CPAP with PEEP at this time as long as he can tolerate it use precedex and change to [facility] if sign of distress  CARDIOVASCULAR  Cardiac arrest- PEA vs asystole, ROSC after 5 mins: unclear downtime as he was found agonal and not on telemetry  Shock (hypovolemic/hemorrhagic plus other)- unclear etiology at this time hypovolemic vs cardiogenic vs less likely component of septic with no clear source  Elevated troponin- mild-- Echo with normal EF  Atrial fibrillation, on long-term warfarin therapy-- On hold due to hematuria  Coronary artery disease  Metabolic syndrome (hypertension, hyperlipidemia, type 2 diabetes)  Abdominal aortic aneurysm Titrate norepinephrine gtt for MAP 65.-- Stop as BP is very high Type and screen.  Transfuse 1 units PRBC.  Continue transfusion to  hemoglobin >7. Decrease Solu-Cortef to 50 mg IV every 12 hours. Hold Coumadin.  RENAL  AKI  (baseline Cr 0.7-1.0)  Lactic acidosis  Hydronephrosis w/ bladder tumor s/p continous bladder irrigation  Gross hematuria Urology help appreciated Continue flomax daily Give large dose of lasix  If continue to have issue will get Renal involved and might need imaging as it all might be post obstructive due to bladder clot Trend mag/ phos/daily wt/ urinary output  GASTROINTESTINAL  NPO  GI prophylaxis: Protonix NPO  HEMATOLOGIC  Anemia - baseline 13-14  Supratherapeutic INR on coumadin with hematuria Transfuse to Hgb> 7. Trend CBC and coags-- INR is 1.52 now SCDs only  INFECTIOUS  No known acute process  - CXR and UA neg - PCT neg Trend WBC Monitor clinically   ENDOCRINE  Hypothyroidism Continue synthroid CBG q 4 while NPO  NEUROLOGIC  Acute anoxic/ischemic encephalopathy  Parkinson's disease with advanced dementia  Myoclonic jerks Continue sedation vacation until RASS 0. Rewarming per protocol. EEG with diffuse slowing Head ct was ok   Skin/Wound: chronic changes   Electrolytes: Replace electrolytes per ICU electrolyte replacement protocol.   IVF: none  Nutrition: Tube feeds   Prophylaxis: DVT Prophylaxis with SCD,. GI Prophylaxis.   Restraints: Mittents   PT/OT eval and treat. OOB when appropriate.   Lines/Tubes:  4/23 foley  L IJ 4/22 central line.  ADVANCE DIRECTIVE:DNR parital code no CPR or meds but  Continue intubation for now  FAMILY DISCUSSION:Spoke with wife and daughter at length  Quality Care: PPI, DVT prophylaxis, HOB elevated, Infection control all reviewed  and addressed.  Events and notes from last 24 hours reviewed. Care plan discussed on multidisciplinary rounds  CC TIME:45 min    Old records reviewed discussed results and management plan with patient  Images personally reviewed and results and labs reviewed and  discussed with patient.  All medication reviewed and adjusted  Further management depending on test results and work up as outlined above.    Lahoma Rocker, M.D      SUBJECTIVE:   -Interim events: On rewarming phase   Continues to have gross hematuria on continuous bladder irrigation. Urology replaced Foley last night. Help appreciated. Hgb 6.6-->7.1- 7.3 received another unit of blood. EEG showed diffuse slowing.  Currently off sedation for sedation vacation (Precedex, fentanyl).  More awake and following commands. ON SBT tolerating well.   The patient's wife is at the bedside and reports that the patient has advanced dementia.  She adds that patient does not wish to have CPR done. Spoke with family at length about code status and overall condition. Family suggested lots of family member coming tonight and if he gets extubated they do not wish for him to be re intubated. Cr is about 5.29 Resp culture normal flora    HISTORY OF PRESENT ILLNESS 77yoM with atrial fibrillation (on Coumadin), OSA, Parkinson's disease with advanced dementia, abdominal aortic aneurysm, coronary artery disease, HTN, DM, HLD, hepatic steatosis, and gout admitted 03/06/2018 with hematuria and AKI (Cr 3.01 on admission, baseline 0.7-1.0). He was also found at that time to have hematuria, for which Urology was consulted and started continuous bladder irrigation. Hgb on admission was 12.7, from baseline of 13-14. During admission, it continued to drop to a low of 7.7 on 4/21 PM. He is on chronic coumadin for his Afib, and on admission, his INR was 2.89, then climbing to 3.01, now 3.12. It does not appear that his INR was reversed during this admission, despite the hematuria. But he was not given any Coumadin either. For his AKI, he was given 500cc IVF bolus on 4/19 and another 1L IVF bolus on 4/21. He was continued on NS @ 50 cc/hr for several days. Patient was also started on Bactrim (reason being "due to extensive catheter  manipulation") on 4/21. On 4/20 PM and again on 4/21 PM, patient refused his CPAP. Sitter was at bedside as patient was confused. At 3:24 am the sitter noted that patient had agonal breathing. He was found to be pulseless with a PEA rhythm. CPR was started with ROSC attained after 5 minutes. Post resuscitation, patient was making nonpurposeful movments of arms, with some myoclonic jerks but was not obeying any commands.    Prior to Admission medications   Medication Sig Start Date End Date Taking? Authorizing Provider  amLODipine (NORVASC) 10 MG tablet TAKE ONE TABLET BY MOUTH ONCE DAILY 12/30/17  Yes Turner, Traci R, MD  carbidopa-levodopa (SINEMET CR) 50-200 MG tablet Take 1 tablet by mouth 4 (four) times daily. 7, 11, 3 pm and 7 pm 11/27/17  Yes Star Age, MD  carboxymethylcellulose (REFRESH PLUS) 0.5 % SOLN Place 1 drop into both eyes 3 (three) times daily as needed (dry eyes).   Yes [provider]  Cholecalciferol (VITAMIN D-3) 1000 UNITS CAPS Take 2,000 Units by mouth daily.    Yes [provider]  Cinnamon 500 MG capsule Take 1,000 mg by mouth daily.    Yes [provider]  clonazePAM (KLONOPIN) 0.25 MG disintegrating tablet DISSOLVE 2 TABLETS IN MOUTH AT BEDTIME 01/13/18  Yes  Star Age, MD  DHA-EPA-Vitamin E 734-287-6 MG-MG-UNIT CAPS Take 2 capsules by mouth daily.    Yes [provider]  donepezil (ARICEPT) 10 MG tablet Take 1 tablet (10 mg total) by mouth daily with breakfast. 01/13/18  Yes Star Age, MD  fluticasone (FLONASE) 50 MCG/ACT nasal spray Place 2 sprays into the nose daily as needed for allergies.    Yes [provider]  levothyroxine (SYNTHROID, LEVOTHROID) 125 MCG tablet Take 125 mcg by mouth daily.     Yes [provider]  lisinopril (PRINIVIL,ZESTRIL) 40 MG tablet TAKE 1 TABLET BY MOUTH ONCE DAILY 12/02/17  Yes Turner, Eber Hong, MD  memantine (NAMENDA) 10 MG tablet Take 1 tablet (10 mg total) by mouth 2 (two) times  daily. 01/13/18  Yes Star Age, MD  OVER THE COUNTER MEDICATION Take 1 tablet by mouth 2 (two) times daily. Macular degeneration supplement   Yes [provider]  OVER THE COUNTER MEDICATION Take 1 capsule by mouth daily. Homocysteine supplement   Yes [provider]  rosuvastatin (CRESTOR) 5 MG tablet TAKE 1 TABLET BY MOUTH ONCE DAILY 02/25/18  Yes Turner, Traci R, MD  warfarin (COUMADIN) 5 MG tablet TAKE AS DIRECTED BY COUMADIN CLINIC Patient taking differently: Take 5-7.5 mg by mouth See admin instructions. Take 1 tablet (5 mg) by mouth daily except on Tuesdays Take 1.5 tablets (7.5 mg). 11/18/17  Yes Turner, Eber Hong, MD  EPIPEN 2-PAK 0.3 MG/0.3ML SOAJ Inject 0.3 mg into the muscle once as needed. Allergic reaction 02/25/13   [provider]  guaiFENesin (MUCINEX) 600 MG 12 hr tablet Take 2 tablets (1,200 mg total) by mouth 2 (two) times daily. Patient not taking: Reported on 03/01/2018 01/21/17   Oswald Hillock, MD  tamsulosin (FLOMAX) 0.4 MG CAPS capsule Take 0.4 mg by mouth daily.  02/28/18   [provider]     Current Facility-Administered Medications:  .  0.9 %  sodium chloride infusion, 250 mL, Intravenous, PRN, Omar Person, NP .  0.9 %  sodium chloride infusion, , Intravenous, Continuous, Hammonds, Sharyn Blitz, MD .  acetaminophen (TYLENOL) tablet 650 mg, 650 mg, Oral, Q6H PRN **OR** acetaminophen (TYLENOL) suppository 650 mg, 650 mg, Rectal, Q6H PRN, Alma Friendly, MD .  chlorhexidine gluconate (MEDLINE KIT) (PERIDEX) 0.12 % solution 15 mL, 15 mL, Mouth Rinse, BID, Alma Friendly, MD, 15 mL at 03/19/18 0826 .  Chlorhexidine Gluconate Cloth 2 % PADS 6 each, 6 each, Topical, Daily, Sampson Goon, MD, 6 each at 03/19/18 0055 .  dexmedetomidine (PRECEDEX) 200 MCG/50ML (4 mcg/mL) infusion, 0.4-2 mcg/kg/hr, Intravenous, Titrated, Jennelle Human B, NP, Stopped at 03/19/18 1115 .  docusate (COLACE) 50 MG/5ML liquid 100 mg, 100 mg, Per Tube,  BID, Hammonds, Sharyn Blitz, MD, 100 mg at 03/19/18 0912 .  famotidine (PEPCID) IVPB 20 mg premix, 20 mg, Intravenous, Q24H, Lyndee Leo, [facility], Stopped at 03/19/18 8115 .  feeding supplement (PRO-STAT SUGAR FREE 64) liquid 30 mL, 30 mL, Per Tube, QID, Renee Pain, MD, 30 mL at 03/19/18 0913 .  feeding supplement (VITAL AF 1.2 CAL) liquid 1,000 mL, 1,000 mL, Per Tube, Continuous, Renee Pain, MD, Last Rate: 45 mL/hr at 03/19/18 1000 .  fentaNYL (SUBLIMAZE) bolus via infusion 25 mcg, 25 mcg, Intravenous, Q1H PRN, Omar Person, NP, 25 mcg at 03/18/18 1726 .  fentaNYL 2539mg in NS 253m(1097mml) infusion-PREMIX, 25-400 mcg/hr, Intravenous, Continuous, GraSampson GoonD, Last Rate: 5 mL/hr at 03/19/18 1300, 50 mcg/hr at  03/19/18 1300 .  finasteride (PROSCAR) tablet 5 mg, 5 mg, Oral, Daily, McKenzie, Candee Furbish, MD, 5 mg at 03/19/18 0913 .  furosemide (LASIX) injection 80 mg, 80 mg, Intravenous, Once, Manuella Ghazi, Khalil Szczepanik, MD .  hydrocortisone sodium succinate (SOLU-CORTEF) 100 MG injection 50 mg, 50 mg, Intravenous, Q6H, Sampson Goon, MD, 50 mg at 03/19/18 1214 .  insulin aspart (novoLOG) injection 2-6 Units, 2-6 Units, Subcutaneous, Q4H, Sampson Goon, MD, 2 Units at 03/19/18 1214 .  ipratropium-albuterol (DUONEB) 0.5-2.5 (3) MG/3ML nebulizer solution 3 mL, 3 mL, Nebulization, Q6H PRN, Sampson Goon, MD .  levothyroxine (SYNTHROID, LEVOTHROID) tablet 125 mcg, 125 mcg, Oral, QAC breakfast, Alma Friendly, MD, 125 mcg at 03/19/18 0900 .  MEDLINE mouth rinse, 15 mL, Mouth Rinse, 10 times per day, Alma Friendly, MD, 15 mL at 03/19/18 1200 .  ondansetron (ZOFRAN) tablet 4 mg, 4 mg, Oral, Q6H PRN **OR** ondansetron (ZOFRAN) injection 4 mg, 4 mg, Intravenous, Q6H PRN, Alma Friendly, MD .  phenylephrine (NEO-SYNEPHRINE) 40 mg in sodium chloride 0.9 % 250 mL (0.16 mg/mL) infusion, 0-400 mcg/min, Intravenous, Titrated, Alma Friendly, MD, Stopped at 03/17/18 1645 .   polyethylene glycol (MIRALAX / GLYCOLAX) packet 17 g, 17 g, Oral, Daily PRN, Alma Friendly, MD .  polyvinyl alcohol (LIQUIFILM TEARS) 1.4 % ophthalmic solution 1 drop, 1 drop, Both Eyes, TID PRN, Berton Mount, RPH .  rosuvastatin (CRESTOR) tablet 5 mg, 5 mg, Oral, QHS, Alma Friendly, MD, 5 mg at 03/18/18 2213 .  sennosides (SENOKOT) 8.8 MG/5ML syrup 5 mL, 5 mL, Per Tube, QHS, Hammonds, Sharyn Blitz, MD, 5 mL at 03/18/18 2212 .  sodium chloride flush (NS) 0.9 % injection 10-40 mL, 10-40 mL, Intracatheter, Q12H, Sampson Goon, MD, 10 mL at 03/18/18 2212 .  sodium chloride flush (NS) 0.9 % injection 10-40 mL, 10-40 mL, Intracatheter, PRN, Sampson Goon, MD .  tamsulosin [facility]) capsule 0.4 mg, 0.4 mg, Oral, Daily, Alma Friendly, MD, 0.4 mg at 03/19/18 0913 .  THROMBI-PAD 3"X3" pad 1 each, 1 each, Topical, Once, Hammonds, Sharyn Blitz, MD   OBJECTIVE:   VITAL SIGNS: BP 100/60   Pulse 73   Temp 98.8 F (37.1 C)   Resp 15   Ht _0  (1.702 m)   Wt 98.4 kg (217 lb)   SpO2 94%   BMI 33.99 kg/m  Vitals:   03/19/18 1315 03/19/18 1330 03/19/18 1335 03/19/18 1400  BP:    100/60  Pulse:      Resp: _1 Temp:      TempSrc:      SpO2: 94% 94% 94% 94%  Weight:      Height:        HEMODYNAMICS: CVP:  [9 mmHg-15 mmHg] 14 mmHg  VENTILATOR SETTINGS: Vent Mode: PSV;CPAP FiO2 (%):  [40 %] 40 % Set Rate:  [14 bmp] 14 bmp Vt Set:  [530 mL] 530 mL PEEP:  [5 cmH20] 5 cmH20 Pressure Support:  [10 cmH20] 10 cmH20 Plateau Pressure:  [11 cmH20-13 cmH20] 13 cmH20  INTAKE / OUTPUT: I/O last 3 completed shifts: In: 91478 [I.V.:6715.3; Blood:2403.2; GNFAO:13086; NG/GT:751.5; IV Piggyback:170] Out: 57846 [Urine:46400]  PHYSICAL EXAMINATION: GENERAL: Intubated.  Sedated, unresponsive.  No acute distress. HEAD: normocephalic, atraumatic EYE: PERRLA, EOM intact, no scleral icterus, no pallor. THROAT/ORAL CAVITY: ETT in situ. Mallampati class cannot be determined at  this time. NECK: supple, no thyromegaly, no JVD, no lymphadenopathy. Trachea midline.  Left IJ  CVC in situ. CHEST/LUNG: symmetric in development and expansion.  Diminished air entry. no crackles. No wheezes. HEART: Regular S1 and S2 without murmur, rub or gallop. ABDOMEN: soft, nontender, nondistended. Normoactive bowel sounds. No rebound. No guarding. No hepatosplenomegaly. EXTREMITIES: Edema: 1+. No cyanosis.  No clubbing. 2+ DP pulses LYMPHATIC: no cervical/axiallary/inguinal lymph nodes appreciated MUSCULOSKELETAL: No bulk atrophy. SKIN: No rash or lesion. NEUROLOGIC: Leftward gaze preference.  Spontaneous respirations intact.  Facial symmetry.  Babinski absent.  Myoclonic jerks (right upper extremity, bilateral lower extremities).  DTR: 1+ @ R biceps, 1+ @ L biceps, 2+ @ R patellar,  2+ @ L patellar.   LABS:  SERUM CHEMISTRY Recent Labs  Lab 03/18/18 0358 03/18/18 1309 03/18/18 1639 03/19/18 0335  NA 144  --  145 142  K 3.9  --  3.7 3.5  CL 108  --  104 97*  CO2 27  --  30 33*  BUN 59*  --  69* 82*  CREATININE 4.60*  --  5.04* 5.29*  GLUCOSE 129*  --  153* 154*  CALCIUM 6.8*  --  7.0* 6.8*  MG 2.1 2.1 2.1 2.2  PHOS 5.7* 6.6* 6.1* 6.8*    Glucose Recent Labs  Lab 03/18/18 1644 03/18/18 2019 03/19/18 0012 03/19/18 0342 03/19/18 0838 03/19/18 1152  GLUCAP 150* 130* 165* 153* 156* 141*    Liver Enzymes Recent Labs  Lab 03/22/2018 1145 03/17/18 0336  AST 22 23  ALT 6* 6*  ALKPHOS 56 35*  BILITOT 0.7 0.4  ALBUMIN 4.0 2.5*    CBC Recent Labs  Lab 03/17/18 0952  03/18/18 0358 03/18/18 1639 03/19/18 0335  WBC 15.6*  --  11.1*  --  11.5*  HGB 6.2*   < > 7.1* 8.8* 7.3*  HCT 18.6*   < > 20.8* 25.8* 21.7*  PLT 128*  --  77*  --  75*   < > = values in this interval not displayed.    COAGULATION STUDIES Recent Labs  Lab 03/17/18 0520 03/17/18 0952 03/18/18 0358 03/19/18 0335  APTT 33 34  --   --   INR 3.02 2.53 1.61 1.52    Sepsis Markers Recent  Labs  Lab 03/17/18 0520  03/17/18 1357 03/17/18 1553 03/17/18 2134 03/18/18 0358 03/19/18 0335  LATICACIDVEN  --    < > 2.2* 1.0 0.8  --   --   PROCALCITON <0.10  --   --   --   --  2.29 2.28   < > = values in this interval not displayed.    ABG Recent Labs  Lab 03/17/18 1358 03/18/18 0910 03/19/18 0405  PHART 7.349* 7.522* 7.516*  PCO2ART 41.4 37.9 44.5  PO2ART 116.0* 106.0 88.3    Cardiac Enzymes Recent Labs  Lab 03/17/18 1357 03/17/18 1813 03/18/18 0017  TROPONINI 0.29* 0.24* 0.19*    CULTURES: Results for orders placed or performed during the hospital encounter of 02/25/2018  Culture, Urine     Status: None   Collection Time: 03/15/18 11:47 AM  Result Value Ref Range Status   Specimen Description   Final    URINE, CLEAN CATCH Performed at [facility], [place] [address], [place], [place] [address]    Special Requests   Final    NONE Performed at [facility], [place] [address] [place], [place] [address]    Culture   Final    NO GROWTH Performed at [facility], [place] [address], [place], [place] [address]  Report Status 03/16/2018 FINAL  Final  MRSA PCR Screening     Status: None   Collection Time: 03/17/18  4:09 AM  Result Value Ref Range Status   MRSA by PCR NEGATIVE NEGATIVE Final    Comment:        The GeneXpert MRSA Assay (FDA approved for NASAL specimens only), is one component of a comprehensive MRSA colonization surveillance program. It is not intended to diagnose MRSA infection nor to guide or monitor treatment for MRSA infections. Performed at [facility], [place] [address] [place], [place] [address]   Culture, blood (routine x 2)     Status: None (Preliminary result)   Collection Time: 03/17/18  4:32 AM  Result Value Ref Range Status   Specimen Description   Final    BLOOD CENTRAL LINE Performed at [facility], [place] [address] [place], [place] [address]      Special Requests   Final    BOTTLES DRAWN AEROBIC ONLY Blood Culture results may not be optimal due to an excessive volume of blood received in culture bottles Performed at [place] [address], [place], [place] [address]    Culture   Final    NO GROWTH 1 DAY Performed at [facility], [place] [address] [place], [place] [address]    Report Status PENDING  Incomplete  Culture, respiratory (NON-Expectorated)     Status: None   Collection Time: 03/17/18  4:50 AM  Result Value Ref Range Status   Specimen Description   Final    TRACHEAL ASPIRATE Performed at [place] [address] [place], [place] [address]    Special Requests   Final    NONE Performed at [facility], [place] [address] [place], [place] [address]    Gram Stain   Final    FEW WBC PRESENT, PREDOMINANTLY PMN FEW GRAM NEGATIVE RODS FEW GRAM NEGATIVE COCCOBACILLI RARE GRAM POSITIVE COCCI IN PAIRS    Culture   Final    Consistent with normal respiratory flora. Performed at [facility], [place] [address] [place], [place] [address]    Report Status 03/19/2018 FINAL  Final     IMAGING: No results found.  OTHER STUDIES:  4/22: 2D echo done  ANTIBIOTICS: -  SIGNIFICANT EVENTS: 4/19: Admitted to [facility] with hematuria and acute kidney injury. 4/22: Found to demonstrate agonal breathing, PEA arrest.  Transferred to [facility] 2H for normothermia protocol. 4/23 beginning rewarming phase. 4/24 doing well on SBT   LINES/TUBES: Continuous bladder irrigation (4/19-) Endotracheally intubated (4/22-) Arterial line (4/22-) CVC, left IJ (4/22-)   My assessment, plan of care, findings, medications, side effects, etc. were discussed with:  Nurse  Wife and daughter (questions answered) updated   FAMILY  - Updates: Wife and daughter at bedside, updated.  - Inter-disciplinary family meet or Palliative Care meeting due by:  03/23/2018  Lahoma Rocker Pulmonary Critical Care & Sleep Medicine

## 2018-03-19 NOTE — Progress Notes (Signed)
Called to patients room by RN due to patient SPO2 staying in the 80's arrived suctioned and lavaged patient placed on 80% FIO2 patients SPO2 was 94% and maintaining when I left the room.  RT will continue to monitor and wean FIO2 as tolerated.

## 2018-03-19 NOTE — Progress Notes (Signed)
OT Cancellation/Discontinuation Note  Patient Details Name: Troy [name] MRN: [id number] DOB: 12-18-1940   Cancelled Treatment:    Reason Eval/Treat Not Completed: Patient not medically ready;Patient's level of consciousness;Medical issues which prohibited therapy.   Chart reviewed, discussed pt with RN;  Medical decline since OT eval, cardiac arrest, currently sedated on vent, condition guarded;   Will dc OT services at this time and be happy to reconsult with new orders when pt is medically ready    Jaci Carrel 03/19/2018, 2:29 PM  Hulda Humphrey OTR/L [phone]

## 2018-03-19 NOTE — Progress Notes (Signed)
PT Cancellation Note  Patient Details Name: Troy [name] MRN: [id number] DOB: [birth year]   Cancelled Treatment:    Reason Eval/Treat Not Completed: Patient not medically ready   Chart reviewed, discussed pt with RN;  Medical decline since PT eval, cardiac arrest, currently sedated on vent, condition guarded;   Will dc PT services at this time and be happy to reconsult with new orders when pt is medically ready;  Thank you,  Roney Marion, PT  Acute Rehabilitation Services Pager [phone] Office [phone]    [name] 03/19/2018, 11:25 AM

## 2018-03-19 NOTE — Plan of Care (Signed)
Had to manually irrigate foley x3 throughout shift. Night shift RN made aware

## 2018-03-19 NOTE — Progress Notes (Signed)
CBI running clear--minimally pink. No clots overnight.  Continue CBI but may slow rate down.

## 2018-03-20 ENCOUNTER — Inpatient Hospital Stay (HOSPITAL_COMMUNITY): Payer: Medicare Other

## 2018-03-20 LAB — CBC WITH DIFFERENTIAL/PLATELET
BASOS PCT: 0 %
Basophils Absolute: 0 10*3/uL (ref 0.0–0.1)
EOS PCT: 0 %
Eosinophils Absolute: 0 10*3/uL (ref 0.0–0.7)
HCT: 28.6 % — ABNORMAL LOW (ref 39.0–52.0)
HEMOGLOBIN: 9.4 g/dL — AB (ref 13.0–17.0)
LYMPHS ABS: 0.7 10*3/uL (ref 0.7–4.0)
Lymphocytes Relative: 14 %
MCH: 28.7 pg (ref 26.0–34.0)
MCHC: 32.9 g/dL (ref 30.0–36.0)
MCV: 87.5 fL (ref 78.0–100.0)
MONO ABS: 0.3 10*3/uL (ref 0.1–1.0)
MONOS PCT: 6 %
NEUTROS PCT: 80 %
Neutro Abs: 4.2 10*3/uL (ref 1.7–7.7)
Platelets: 118 10*3/uL — ABNORMAL LOW (ref 150–400)
RBC: 3.27 MIL/uL — AB (ref 4.22–5.81)
RDW: 14.9 % (ref 11.5–15.5)
WBC: 5.2 10*3/uL (ref 4.0–10.5)

## 2018-03-20 LAB — POCT I-STAT 3, ART BLOOD GAS (G3+)
Acid-Base Excess: 12 mmol/L — ABNORMAL HIGH (ref 0.0–2.0)
Acid-Base Excess: 12 mmol/L — ABNORMAL HIGH (ref 0.0–2.0)
Bicarbonate: 35.1 mmol/L — ABNORMAL HIGH (ref 20.0–28.0)
Bicarbonate: 35.6 mmol/L — ABNORMAL HIGH (ref 20.0–28.0)
O2 SAT: 86 %
O2 Saturation: 87 %
PCO2 ART: 38.3 mmHg (ref 32.0–48.0)
PCO2 ART: 42.7 mmHg (ref 32.0–48.0)
PO2 ART: 44 mmHg — AB (ref 83.0–108.0)
PO2 ART: 49 mmHg — AB (ref 83.0–108.0)
Patient temperature: 37.1
Patient temperature: 37.7
TCO2: 36 mmol/L — AB (ref 22–32)
TCO2: 37 mmol/L — AB (ref 22–32)
pH, Arterial: 7.57 — ABNORMAL HIGH (ref 7.350–7.450)
pH, Arterial: 7.532 — ABNORMAL HIGH (ref 7.350–7.450)

## 2018-03-20 LAB — MAGNESIUM: Magnesium: 2.1 mg/dL (ref 1.7–2.4)

## 2018-03-20 LAB — PHOSPHORUS: Phosphorus: 6.5 mg/dL — ABNORMAL HIGH (ref 2.5–4.6)

## 2018-03-20 LAB — BASIC METABOLIC PANEL
ANION GAP: 15 (ref 5–15)
BUN: 105 mg/dL — AB (ref 6–20)
CHLORIDE: 98 mmol/L — AB (ref 101–111)
CO2: 32 mmol/L (ref 22–32)
Calcium: 7.7 mg/dL — ABNORMAL LOW (ref 8.9–10.3)
Creatinine, Ser: 5.95 mg/dL — ABNORMAL HIGH (ref 0.61–1.24)
GFR calc Af Amer: 9 mL/min — ABNORMAL LOW (ref >60)
GFR, EST NON AFRICAN AMERICAN: 8 mL/min — AB (ref >60)
Glucose, Bld: 160 mg/dL — ABNORMAL HIGH (ref 65–99)
POTASSIUM: 3.3 mmol/L — AB (ref 3.5–5.1)
SODIUM: 145 mmol/L (ref 135–145)

## 2018-03-20 LAB — GLUCOSE, CAPILLARY
GLUCOSE-CAPILLARY: 140 mg/dL — AB (ref 65–99)
Glucose-Capillary: 168 mg/dL — ABNORMAL HIGH (ref 65–99)

## 2018-03-20 LAB — BRAIN NATRIURETIC PEPTIDE: B NATRIURETIC PEPTIDE 5: 201.6 pg/mL — AB (ref 0.0–100.0)

## 2018-03-20 LAB — HEPATIC FUNCTION PANEL
ALBUMIN: 2.1 g/dL — AB (ref 3.5–5.0)
ALT: 101 U/L — ABNORMAL HIGH (ref 17–63)
AST: 159 U/L — ABNORMAL HIGH (ref 15–41)
Alkaline Phosphatase: 52 U/L (ref 38–126)
BILIRUBIN TOTAL: 0.9 mg/dL (ref 0.3–1.2)
Bilirubin, Direct: <0.1 mg/dL — ABNORMAL LOW (ref 0.1–0.5)
TOTAL PROTEIN: 4.7 g/dL — AB (ref 6.5–8.1)

## 2018-03-20 LAB — PROTIME-INR
INR: 1.27
Prothrombin Time: 15.8 seconds — ABNORMAL HIGH (ref 11.4–15.2)

## 2018-03-21 LAB — TYPE AND SCREEN
ABO/RH(D): A POS
ANTIBODY SCREEN: NEGATIVE
UNIT DIVISION: 0
UNIT DIVISION: 0
UNIT DIVISION: 0
UNIT DIVISION: 0
Unit division: 0
Unit division: 0
Unit division: 0

## 2018-03-21 LAB — BPAM RBC
BLOOD PRODUCT EXPIRATION DATE: 201905192359
BLOOD PRODUCT EXPIRATION DATE: 201905202359
BLOOD PRODUCT EXPIRATION DATE: 201905202359
BLOOD PRODUCT EXPIRATION DATE: 201905202359
BLOOD PRODUCT EXPIRATION DATE: 201905222359
Blood Product Expiration Date: 201905212359
Blood Product Expiration Date: 201905222359
ISSUE DATE / TIME: 201904221202
ISSUE DATE / TIME: 201904222009
ISSUE DATE / TIME: 201904222202
ISSUE DATE / TIME: 201904231021
ISSUE DATE / TIME: 201904231236
ISSUE DATE / TIME: 201904241356
UNIT TYPE AND RH: 6200
UNIT TYPE AND RH: 6200
Unit Type and Rh: 6200
Unit Type and Rh: 6200
Unit Type and Rh: 6200
Unit Type and Rh: 6200
Unit Type and Rh: 6200

## 2018-03-22 LAB — CULTURE, BLOOD (ROUTINE X 2): CULTURE: NO GROWTH

## 2018-03-26 NOTE — Progress Notes (Signed)
Chaplain responded to page from Nurse to be with family prior to extubating the patient. Chaplain prayed with the family as requested. Chaplain greeted parish Marine scientist and Presenter, broadcasting upon their arrival. Troy [name] stayed with family through the procedure.

## 2018-03-26 NOTE — Progress Notes (Signed)
Extubated patient to comfort care, on fentanyl and precedex. Family at bedside, comfort cart ordered. Clergy in with family. All other medications off.

## 2018-03-26 NOTE — Plan of Care (Signed)
Patient decompensating, family decision to withdraw care, comfort measures

## 2018-03-26 NOTE — Progress Notes (Signed)
Family notified about change in pt's condition. Awaiting family arrival.

## 2018-03-26 NOTE — Progress Notes (Signed)
Been in patients room for the last 30-45 minutes patient was desating in the low 80's got as low as 78 bagged and lavaged several times added peep valve to AMBU bag and SPO2 increased slightly.. ABG obtained and revealed that SO2 and SPO2 was correlating.Troy [name] was contacted by RN MD ordered to increase peep to 10 pt placed on vent peep increased pt now sating 91-95%.  RN called family due to patient being unstable all night.

## 2018-03-26 NOTE — Progress Notes (Signed)
Family at bedside. Questions and concerns addressed. CDS notified.

## 2018-03-26 NOTE — Procedures (Signed)
Extubation Procedure Note  Patient Details:   Name: MARKUS CASTEN DOB: [birth year] MRN: [id number]   Airway Documentation:  Airway 7 mm (Active)  Secured at (cm) 27 cm Apr 12, 2018  3:41 AM  Measured From Lips 12-Apr-2018  3:41 AM  Secured Location Left 04/12/2018  3:41 AM  Secured By [employer] Apr 12, 2018  3:41 AM  Tube Holder Repositioned Yes 04/12/2018  3:41 AM  Cuff Pressure (cm H2O) 29 cm H2O 03/19/2018  9:00 AM  Site Condition Dry 2018/04/12  3:41 AM   Vent end date: (not recorded) Vent end time: (not recorded)   Evaluation  O2 sats: patient on comfort care Complications: Complications of comfort care  Patient did tolerate procedure well. Bilateral Breath Sounds: Rhonchi   No   Patient extubated per order and family wishes to comfort care. Positive cuff leak was noted prior to extubation. RN is in room and patient's family is at bedside. RT will continue to monitor.  Atheena Spano [name] 2018/04/12, 7:48 AM

## 2018-03-26 NOTE — Progress Notes (Signed)
Patient expired, flat line on EKG no heart sounds heard apically auscultated x 1 minute, no heart sounds of breaths. .Pronounced  By Alma Friendly RN. and Meda Klinefelter RN.Family support given. Information obtained. [place] donor services called with time of death.

## 2018-03-26 NOTE — Progress Notes (Signed)
[place] Progress Note Patient Name: DAILEY BUCCHERI DOB: [birth year] MRN: [id number]   Date of Service  March 21, 2018  HPI/Events of Note  Call from nurse for worsening hypoxemia  eICU Interventions  Repeated CXR worsening infiltrates. PEEP increased to 10     Intervention Category Intermediate Interventions: Other:  Sharia Reeve 03/21/18, 4:19 AM

## 2018-03-26 NOTE — Progress Notes (Signed)
Fentanyl bag wasted 145 ml, down sink in room with Meda Klinefelter RN.  Body down to morgue at [phone] with death certificate.

## 2018-03-26 NOTE — Progress Notes (Signed)
Extubated, family and clergy at bedside. Neo stopped, increasing fentanyl incrementally for comfort. Patient snoring with even respirations. Monitor on comfort mode.

## 2018-03-26 NOTE — Progress Notes (Signed)
Patient condition detoriorated overnight required to go on 100% oxygen and high peep with levophed needed to be reintroduced and increased to 60 mcg. Received a call from nurse in am that all family is here and they wish to withdraw care and make him comfort only. Spoke with family and confirmed above at bed side. All questions answered. Will plan extubation and comfort measures only orders per family wishes <spoke with wife and daughter>.  Plan: Terminal extubation DNR Comfort measures only DC all other drips meds and labs Continue precedex and fentanyl. Will support family Mable Fill is at bed side  [facility] Rocker [facility]

## 2018-03-26 NOTE — Death Summary Note (Signed)
Death Summary  Troy [name] CWC:[id number] DOB: 1941/03/02 DOA: March 19, 2018  PCP: Jonathon Jordan, MD  Admit date: 03-19-2018 Date of Death: March 25, 2018 Time of Death: 08:52 am  Notification: Jonathon Jordan, MD notified of death of Mar 25, 2018   History of present illness:  Troy [name] is a 77 y.o. male with a history of  Troy [name] presented with complaint of  Troy [name] did not improve after intubation and management of shock (very brief description of intervention)  Code Status= DNR Brief Summary/Hospital Stay:   [phone] with atrial fibrillation (on Coumadin), OSA, Parkinson's disease with advanced dementia, abdominal aortic aneurysm, coronary artery disease, HTN, DM, HLD, hepatic steatosis, and gout admitted 2018-03-19 with hematuria and AKI (Cr 3.01 on admission, baseline 0.7-1.0). He was also found at that time to have hematuria, for which Urology was consulted and started continuous bladder irrigation. Hgb on admission was 12.7, from baseline of 13-14. During admission, it continued to drop to a low of 7.7 on 4/21 PM. He is on chronic coumadin for his Afib, and on admission, his INR was 2.89, then climbing to 3.01, now 3.12. It does not appear that his INR was reversed during this admission, despite the hematuria. But he was not given any Coumadin either. For his AKI, he was given 500cc IVF bolus on March 20, 2023 and another 1L IVF bolus on 4/21. He was continued on NS @ 50 cc/hr for several days. Patient was also started on Bactrim (reason being "due to extensive catheter manipulation") on 4/21. On 4/20 PM and again on 4/21 PM, patient refused his CPAP. Sitter was at bedside as patient was confused. At 3:24 am the sitter noted that patient had agonal breathing. He was found to be pulseless with a PEA rhythm. CPR was started with ROSC attained after 5 minutes. Post resuscitation, patient was making nonpurposeful movments of arms, with some myoclonic jerks but was not obeying any  commands  On rewarming phase   Continues to have gross hematuria on continuous bladder irrigation. Urology replaced Foley last night. Help appreciated. Hgb 6.6-->7.1- 7.3 received another unit of blood. EEG showed diffuse slowing.  Currently off sedation for sedation vacation (Precedex, fentanyl).  More awake and following commands. ON SBT tolerating well.   The patient's wife is at the bedside and reports that the patient has advanced dementia.  She adds that patient does not wish to have CPR done. Spoke with family at length about code status and overall condition. Family suggested lots of family member coming tonight and if he gets extubated they do not wish for him to be re intubated. Cr is about 5.29  March 26, 2023  Patient condition detoriorated overnight required to go on 100% oxygen and high peep with levophed needed to be reintroduced and increased to 60 mcg. Received a call from nurse in am that all family is here and they wish to withdraw care and make him comfort only. Spoke with family and confirmed above at bed side. All questions answered. Will plan extubation and comfort measures only orders per family wishes <spoke with wife and daughter>.  Plan: Terminal extubation DNR Comfort measures only  Patient passed away at 8:52 am   On physical exam: Patient was unresponsive to verbal and physical stimuli.  No gag reflex, pupillary reflex or corneal reflex was present.  Heart sounds and breath sounds were absent.  Final Diagnoses:  1.   Acute respiratory failure 2.   Septic Shock 3.   Aspiration pneumonia 4.   Ischemic  Anoxic Encephalopathy 5.   Parkinsons disease  The results of significant diagnostics from this hospitalization (including imaging, microbiology, ancillary and laboratory) are listed below for reference.    Significant Diagnostic Studies: Dg Abd 1 View  Result Date: 03/17/2018 CLINICAL DATA:  Orogastric tube placement. EXAM: ABDOMEN - 1 VIEW COMPARISON:  CT of the  abdomen and pelvis performed 11/22/2015 FINDINGS: The patient's enteric tube is noted ending overlying the antrum of the stomach. The visualized bowel gas pattern is grossly unremarkable. No free intra-abdominal air is seen, though evaluation for free air is limited on a single supine view. External pacing pads are noted. Patient is status post median sternotomy. No acute osseous abnormalities are identified. IMPRESSION: Enteric tube noted ending overlying the antrum of the stomach. Electronically Signed   By: Garald Balding M.D.   On: 03/17/2018 06:16   Ct Head Wo Contrast  Result Date: 03/17/2018 CLINICAL DATA:  Status post cardiac arrest. EXAM: CT HEAD WITHOUT CONTRAST TECHNIQUE: Contiguous axial images were obtained from the base of the skull through the vertex without intravenous contrast. COMPARISON:  03/15/2018 FINDINGS: Brain: Subtle loss of gray-white differentiation is questioned in the right basal ganglia. There is mild hypoattenuation of the thalami which is similar to the prior study, and the left basal ganglia do not appear significantly changed. Cortical gray-white differentiation is similar to the prior study, and there is no sulcal or ventricular effacement. There is no evidence of intracranial hemorrhage, mass, midline shift, or extra-axial fluid collection. Moderately advanced cerebral atrophy is again noted with asymmetric ex vacuo enlargement of the atrium and occipital horn of the left lateral ventricle. Periventricular white matter hypodensities are unchanged and nonspecific but compatible with mild-to-moderate chronic small vessel ischemic disease. Vascular: Calcified atherosclerosis at the skull base. Vertebrobasilar dolichoectasia. No hyperdense vessel. Skull: No fracture or focal osseous lesion. Sinuses/Orbits: Mild mucosal thickening in the ethmoid and sphenoid sinuses. Small fluid levels in the sphenoid sinuses. Clear mastoid air cells. Bilateral cataract extraction. Other: None.  IMPRESSION: 1. Possible subtle loss of gray-white differentiation in the right basal ganglia which could reflect early changes of hypoxic ischemic encephalopathy. 2. No intracranial hemorrhage. 3. Chronic small vessel ischemic disease and advanced cerebral atrophy. Electronically Signed   By: Logan Bores M.D.   On: 03/17/2018 08:45   Ct Head Wo Contrast  Result Date: 03/22/2018 CLINICAL DATA:  Fall a week ago with posttraumatic headaches, initial encounter EXAM: CT HEAD WITHOUT CONTRAST TECHNIQUE: Contiguous axial images were obtained from the base of the skull through the vertex without intravenous contrast. COMPARISON:  08/17/2017 MRI of the head, 01/19/2017 CT of the head FINDINGS: Brain: Mild atrophic changes are noted. Areas of chronic white matter ischemic change are noted as well. No acute hemorrhage, acute infarction or space-occupying mass lesion is identified. Vascular: No hyperdense vessel or unexpected calcification. Skull: Normal. Negative for fracture or focal lesion. Sinuses/Orbits: No acute finding. Other: None. IMPRESSION: Chronic atrophic and ischemic changes without acute abnormality. Electronically Signed   By: Inez Catalina M.D.   On: 03/25/2018 19:02   US Renal  Result Date: 03/17/2018 CLINICAL DATA:  Hydronephrosis. Supratherapeutic INR with hematuria and ongoing bladder irrigation. Possible bladder mass. EXAM: RENAL / URINARY TRACT ULTRASOUND COMPLETE COMPARISON:  Three days ago FINDINGS: Right Kidney: Length: 14 cm.  Moderate hydronephrosis without visible debris. Left Kidney: Length: 14 cm. Moderate hydronephrosis without visible debris. 2 cm upper pole cyst. Bladder: New luminal heterogeneous avascular mass about the Foley catheter balloon with elsewhere distended urinary  bladder. This obscures the area concerning for bladder base mass on prior ultrasound. IMPRESSION: 1. Blood clot completely encompasses the Foley catheter and there is bladder distention with bilateral  hydronephrosis. 2. Blood clot obscures the area concerning for bladder base mass on prior scan. Electronically Signed   By: Monte Fantasia M.D.   On: 03/17/2018 12:02   US Renal  Result Date: 03/16/2018 CLINICAL DATA:  Gross hematuria. EXAM: RENAL / URINARY TRACT ULTRASOUND COMPLETE COMPARISON:  CT abdomen and pelvis 11/22/2015. FINDINGS: Right Kidney: Length: 13.2 cm. Echogenicity within normal limits. Moderate hydronephrosis. No mass visualized. Left Kidney: Length: 14.6 cm. Echogenicity within normal limits. Moderate hydronephrosis. Simple cyst in the upper pole measures 2 cm in diameter. Bladder: There is a mass lesion in the posterior, inferior aspect of the urinary bladder measuring 3.4 x 3.4 6.7 cm. It is difficult to determine if the lesion is emanating from the prostate. IMPRESSION: Moderate bilateral hydronephrosis is likely due to a mass lesion in the urinary bladder. This could be a primary bladder tumor or could be related to the prostate gland. The prostate is difficult to distinguish from the lesion. Electronically Signed   By: Inge Rise M.D.   On: 03/19/2018 17:43   Dg Chest Port 1 View  Result Date: Apr 10, 2018 CLINICAL DATA:  Oxygen desaturation. EXAM: PORTABLE CHEST 1 VIEW COMPARISON:  Chest radiograph March 18, 2018 FINDINGS: Endotracheal tube tip projects 4 cm above the carina. LEFT internal jugular central venous catheter distal tip projects in proximal superior vena cava. Nasogastric tube tip in stomach, side port past GE junction region. Worsening RIGHT perihilar consolidation with air bronchograms. Interstitial prominence and hyperinflation. Cardiomediastinal silhouette is normal, calcified tortuous aortic arch. No pneumothorax. Old LEFT rib fractures. IMPRESSION: Worsening RIGHT perihilar consolidation/pneumonia. 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 No apparent change in life-support lines. Electronically Signed   By: Elon Alas M.D.   On: Apr 10, 2018 04:20   Dg Chest Port 1  View  Result Date: 03/18/2018 CLINICAL DATA:  Hypoxia EXAM: PORTABLE CHEST 1 VIEW COMPARISON:  March 17, 2018 FINDINGS: Endotracheal tube tip is 5.2 cm above the carina. Nasogastric tube tip and side port are below the diaphragm. Temporary pacemaker wires are attached to the right heart. Central catheter tip is in the superior vena cava. No pneumothorax. There is mild bibasilar atelectasis. No edema or consolidation evident. Heart is upper normal in size with pulmonary vascularity within normal limits. No adenopathy. There is aortic atherosclerosis. No evident bone lesions. IMPRESSION: Tube and catheter positions as described without pneumothorax. Mild bibasilar atelectasis. No edema or consolidation. Stable cardiac silhouette. There is aortic atherosclerosis. Aortic Atherosclerosis (ICD10-I70.0). Electronically Signed   By: Lowella Grip III M.D.   On: 03/18/2018 07:59   Dg Chest Port 1 View  Result Date: 03/17/2018 CLINICAL DATA:  Central line placement. EXAM: PORTABLE CHEST 1 VIEW COMPARISON:  Chest radiograph performed earlier today at 3:14 a.m. FINDINGS: The patient's left IJ line is noted ending about the proximal SVC. The patient's endotracheal tube is seen ending 4-5 cm above the carina. An enteric tube is noted extending below the diaphragm. Mild left basilar atelectasis is noted. No pleural effusion or pneumothorax is seen. The cardiomediastinal silhouette is mildly enlarged. The patient is status post median sternotomy. No acute osseous abnormalities are identified. IMPRESSION: 1. Left IJ line noted ending about the proximal SVC. 2. Endotracheal tube seen ending 4-5 cm above the carina. 3. Mild left basilar atelectasis. 4. Mild cardiomegaly. Electronically Signed   By: Garald Balding  M.D.   On: 03/17/2018 06:13   Dg Chest Port 1 View  Result Date: 03/17/2018 CLINICAL DATA:  ETT placement EXAM: PORTABLE CHEST 1 VIEW COMPARISON:  01/19/2017 FINDINGS: Endotracheal tube tip is about 5.6 cm  superior to the carina. Post sternotomy changes. Mild cardiomegaly with aortic atherosclerosis. No acute consolidation or effusion. No pneumothorax. IMPRESSION: 1. Endotracheal tube tip about 5.6 cm superior to the carina 2. Mild cardiomegaly without edema or infiltrate Electronically Signed   By: Donavan Foil M.D.   On: 03/17/2018 03:35    Microbiology: Recent Results (from the past 240 hour(s))  Culture, Urine     Status: None   Collection Time: 03/15/18 11:47 AM  Result Value Ref Range Status   Specimen Description   Final    URINE, CLEAN CATCH Performed at [facility], [place] [address], [place], [place] [address]    Special Requests   Final    NONE Performed at [facility], [place] [address] [place], [place] [address]    Culture   Final    NO GROWTH Performed at [facility], [place] [address] [place], [place] [address]    Report Status 03/16/2018 FINAL  Final  MRSA PCR Screening     Status: None   Collection Time: 03/17/18  4:09 AM  Result Value Ref Range Status   MRSA by PCR NEGATIVE NEGATIVE Final    Comment:        The GeneXpert MRSA Assay (FDA approved for NASAL specimens only), is one component of a comprehensive MRSA colonization surveillance program. It is not intended to diagnose MRSA infection nor to guide or monitor treatment for MRSA infections. Performed at [facility], [place] [address] [place], [place] [address]   Culture, blood (routine x 2)     Status: None (Preliminary result)   Collection Time: 03/17/18  4:32 AM  Result Value Ref Range Status   Specimen Description   Final    BLOOD CENTRAL LINE Performed at [facility], [place] [address] [place], [place] [address]    Special Requests   Final    BOTTLES DRAWN AEROBIC ONLY Blood Culture results may not be optimal due to an excessive volume of blood received in culture bottles Performed at [place] [address] [place], [place] [address]    Culture   Final    NO GROWTH 2 DAYS Performed at [place] [address], [place], [place] [address]    Report Status PENDING  Incomplete  Culture, respiratory (NON-Expectorated)     Status: None   Collection Time: 03/17/18  4:50 AM  Result Value Ref Range Status   Specimen Description   Final    TRACHEAL ASPIRATE Performed at [place] [address], [place], [place] [address]    Special Requests   Final    NONE Performed at [facility], [place] [address], [place], [place] [address]    Gram Stain   Final    FEW WBC PRESENT, PREDOMINANTLY PMN FEW GRAM NEGATIVE RODS FEW GRAM NEGATIVE COCCOBACILLI RARE GRAM POSITIVE COCCI IN PAIRS    Culture   Final    Consistent with normal respiratory flora. Performed at [facility], [place] [address], [place], [place] [address]    Report Status 03/19/2018 FINAL  Final     Labs: Basic Metabolic Panel: Recent Labs  Lab 03/18/18 0358 03/18/18 1309 03/18/18 1639 03/19/18 0335  03/19/18 1738 04-14-18 0333  NA 144  --  145 142 143 145  K 3.9  --  3.7 3.5 3.3* 3.3*  CL 108  --  104 97* 97* 98*  CO2 27  --  30 33* 35* 32  GLUCOSE 129*  --  153* 154* 151* 160*  BUN 59*  --  69* 82* 98* 105*  CREATININE 4.60*  --  5.04* 5.29* 5.99* 5.95*  CALCIUM 6.8*  --  7.0* 6.8* 7.3* 7.7*  MG 2.1 2.1 2.1 2.2 2.2 2.1  PHOS 5.7* 6.6* 6.1* 6.8* 6.9* 6.5*   Liver Function Tests: Recent Labs  Lab 03/08/2018 1145 03/17/18 0336 2018/04/14 0333  AST 22 23 159*  ALT 6* 6* 101*  ALKPHOS 56 35* 52  BILITOT 0.7 0.4 0.9  PROT 7.2 4.6* 4.7*  ALBUMIN 4.0 2.5* 2.1*   No results for input(s): LIPASE, AMYLASE in the last 168 hours. No results for input(s): AMMONIA in the last 168 hours. CBC: Recent Labs  Lab 03/17/18 0336  03/17/18 0952  03/18/18 0358 03/18/18 1639 03/19/18 0335 03/19/18 1738 04-14-2018 0333  WBC 13.8*   < > 15.6*  --  11.1*  --  11.5*  10.7* 5.2  NEUTROABS 11.0*  --   --   --  9.5*  --  10.2* 9.4* 4.2  HGB 6.6*   < > 6.2*   < > 7.1* 8.8* 7.3* 8.1* 9.4*  HCT 19.9*   < > 18.6*   < > 20.8* 25.8* 21.7* 24.0* 28.6*  MCV 93.0   < > 91.2  --  86.7  --  87.1 87.6 87.5  PLT 114*   < > 128*  --  77*  --  75* 89* 118*   < > = values in this interval not displayed.   Cardiac Enzymes: Recent Labs  Lab 03/17/18 0336 03/17/18 0520 03/17/18 1357 03/17/18 1813 03/18/18 0017  TROPONINI 0.03* 0.09* 0.29* 0.24* 0.19*   D-Dimer No results for input(s): DDIMER in the last 72 hours. BNP: Invalid input(s): POCBNP CBG: Recent Labs  Lab 03/19/18 1152 03/19/18 1534 03/19/18 1953 Apr 14, 2018 0010 04/14/18 0359  GLUCAP 141* 139* 148* 168* 140*   Anemia work up No results for input(s): VITAMINB12, FOLATE, FERRITIN, TIBC, IRON, RETICCTPCT in the last 72 hours. Urinalysis    Component Value Date/Time   COLORURINE YELLOW 03/07/2018 1145   APPEARANCEUR HAZY (A) 03/10/2018 1145   LABSPEC 1.009 03/22/2018 1145   PHURINE 5.0 03/13/2018 1145   GLUCOSEU NEGATIVE 03/11/2018 1145   HGBUR LARGE (A) 02/24/2018 1145   BILIRUBINUR NEGATIVE 03/19/2018 1145   KETONESUR NEGATIVE 03/12/2018 1145   PROTEINUR 30 (A) 03/17/2018 1145   UROBILINOGEN 0.2 10/02/2012 1627   NITRITE NEGATIVE 03/01/2018 1145   LEUKOCYTESUR NEGATIVE 03/19/2018 1145   Sepsis Labs Invalid input(s): PROCALCITONIN,  WBC,  LACTICIDVEN  SIGNED:  Lahoma Rocker, MD  Apr 14, 2018, 9:16 AM  If 7PM-7AM, please contact night-coverage [email or web address]

## 2018-03-26 NOTE — Progress Notes (Signed)
[facility] Progress Note Patient Name: Troy [name] DOB: [birth year] MRN: [id number]   Date of Service  2018-04-19  HPI/Events of Note  Patient is now on 100%fio2 and 10 peep and oxygenation is still suboptimal. He is on neo at 28mcg/kg/,min  eICU Interventions  Reviewed notes which mentioned plan for terminal extubation today. Spoke to patient's wife who doesn't want any further escalation of care and is planning for terminal extubation soon once she is able to speak to her church and see if the priest can come at bedside. She will let us know when she wants to proceed with terminal extubation. Patient apepars comfortable on fentanyl gtt and precedex currently.     Intervention Category Intermediate Interventions: Communication with other healthcare providers and/or family  Sharia Reeve 04-19-18, 5:44 AM

## 2018-03-26 DEATH — deceased

## 2018-04-29 ENCOUNTER — Ambulatory Visit: Payer: Medicare Other | Admitting: Cardiology

## 2018-07-14 ENCOUNTER — Ambulatory Visit: Payer: Medicare Other | Admitting: Neurology

## 2018-07-16 ENCOUNTER — Encounter (INDEPENDENT_AMBULATORY_CARE_PROVIDER_SITE_OTHER): Payer: Medicare Other | Admitting: Ophthalmology
